# Patient Record
Sex: Male | Born: 1960 | Race: White | Hispanic: No | Marital: Single | Smoking: Never smoker
Health system: Southern US, Community
[De-identification: ages and names within clinical notes are randomized; demographics above are authoritative.]

## PROBLEM LIST (undated history)

## (undated) DIAGNOSIS — M5430 Sciatica, unspecified side: Secondary | ICD-10-CM

## (undated) DIAGNOSIS — M419 Scoliosis, unspecified: Secondary | ICD-10-CM

## (undated) DIAGNOSIS — G8929 Other chronic pain: Secondary | ICD-10-CM

## (undated) DIAGNOSIS — I10 Essential (primary) hypertension: Secondary | ICD-10-CM

## (undated) DIAGNOSIS — F419 Anxiety disorder, unspecified: Secondary | ICD-10-CM

## (undated) DIAGNOSIS — M545 Low back pain, unspecified: Secondary | ICD-10-CM

## (undated) DIAGNOSIS — N189 Chronic kidney disease, unspecified: Secondary | ICD-10-CM

## (undated) HISTORY — PX: EYE SURGERY: SHX253

## (undated) HISTORY — PX: ROTATOR CUFF REPAIR: SHX139

## (undated) HISTORY — PX: CLEFT PALATE REPAIR: SUR1165

---

## 2014-03-12 ENCOUNTER — Emergency Department: Payer: Self-pay | Admitting: Emergency Medicine

## 2014-12-28 ENCOUNTER — Encounter (HOSPITAL_COMMUNITY): Payer: Self-pay | Admitting: Emergency Medicine

## 2014-12-28 ENCOUNTER — Emergency Department (HOSPITAL_COMMUNITY)
Admission: EM | Admit: 2014-12-28 | Discharge: 2014-12-28 | Disposition: A | Discharge status: Home / Self Care | Payer: Self-pay | Attending: Emergency Medicine | Admitting: Emergency Medicine

## 2014-12-28 DIAGNOSIS — I10 Essential (primary) hypertension: Secondary | ICD-10-CM | POA: Insufficient documentation

## 2014-12-28 DIAGNOSIS — N289 Disorder of kidney and ureter, unspecified: Secondary | ICD-10-CM | POA: Insufficient documentation

## 2014-12-28 DIAGNOSIS — Z8659 Personal history of other mental and behavioral disorders: Secondary | ICD-10-CM | POA: Insufficient documentation

## 2014-12-28 DIAGNOSIS — M419 Scoliosis, unspecified: Secondary | ICD-10-CM | POA: Insufficient documentation

## 2014-12-28 DIAGNOSIS — G8929 Other chronic pain: Secondary | ICD-10-CM | POA: Insufficient documentation

## 2014-12-28 DIAGNOSIS — Z87828 Personal history of other (healed) physical injury and trauma: Secondary | ICD-10-CM | POA: Insufficient documentation

## 2014-12-28 DIAGNOSIS — M549 Dorsalgia, unspecified: Secondary | ICD-10-CM | POA: Insufficient documentation

## 2014-12-28 HISTORY — DX: Low back pain: M54.5

## 2014-12-28 HISTORY — DX: Low back pain, unspecified: M54.50

## 2014-12-28 HISTORY — DX: Essential (primary) hypertension: I10

## 2014-12-28 HISTORY — DX: Sciatica, unspecified side: M54.30

## 2014-12-28 HISTORY — DX: Anxiety disorder, unspecified: F41.9

## 2014-12-28 HISTORY — DX: Scoliosis, unspecified: M41.9

## 2014-12-28 HISTORY — DX: Other chronic pain: G89.29

## 2014-12-28 LAB — I-STAT CHEM 8, ED
BUN: 46 mg/dL — ABNORMAL HIGH (ref 6–23)
Calcium, Ion: 1.16 mmol/L (ref 1.12–1.23)
Chloride: 109 mmol/L (ref 96–112)
Creatinine, Ser: 3.4 mg/dL — ABNORMAL HIGH (ref 0.50–1.35)
GLUCOSE: 100 mg/dL — AB (ref 70–99)
HCT: 42 % (ref 39.0–52.0)
HEMOGLOBIN: 14.3 g/dL (ref 13.0–17.0)
Potassium: 4.5 mmol/L (ref 3.5–5.1)
Sodium: 141 mmol/L (ref 135–145)
TCO2: 18 mmol/L (ref 0–100)

## 2014-12-28 MED ORDER — CARVEDILOL 12.5 MG PO TABS
12.5000 mg | ORAL_TABLET | Freq: Two times a day (BID) | ORAL | Status: DC
Start: 2014-12-28 — End: 2015-10-07

## 2014-12-28 MED ORDER — METHOCARBAMOL 500 MG PO TABS: 500.0000 mg | ORAL_TABLET | Freq: Four times a day (QID) | ORAL | Status: DC | PRN

## 2014-12-28 MED ORDER — LISINOPRIL 10 MG PO TABS: 10.0000 mg | ORAL_TABLET | Freq: Once | ORAL | Status: DC

## 2014-12-28 MED ORDER — IBUPROFEN 200 MG PO TABS
600.0000 mg | ORAL_TABLET | Freq: Once | ORAL | Status: AC
  Administered 2014-12-28: 600 mg via ORAL
  Filled 2014-12-28: qty 3

## 2014-12-28 MED ORDER — CARVEDILOL 12.5 MG PO TABS
12.5000 mg | ORAL_TABLET | Freq: Two times a day (BID) | ORAL | Status: DC
  Administered 2014-12-28: 12.5 mg via ORAL
  Filled 2014-12-28 (×2): qty 1

## 2014-12-28 MED ORDER — LISINOPRIL 20 MG PO TABS
20.0000 mg | ORAL_TABLET | Freq: Once | ORAL | Status: AC
  Administered 2014-12-28: 20 mg via ORAL
  Filled 2014-12-28: qty 1

## 2014-12-28 NOTE — ED Notes (Signed)
Per EMS: Pt from [facility].  Pt seen there for chronic low back pain that he has from a work injury.  Pt has hx of htn and has been noncompliant with meds for 2 years.  Pt is asymptomatic.  No CP.

## 2014-12-28 NOTE — Discharge Instructions (Signed)
Read the information below.  Use the prescribed medication as directed.  Please discuss all new medications with your pharmacist.  You may return to the Emergency Department at any time for worsening condition or any new symptoms that concern you.    Hypertension Hypertension is another name for high blood pressure. High blood pressure forces your heart to work harder to pump blood. A blood pressure reading has two numbers, which includes a higher number over a lower number (example: 110/72). HOME CARE   Have your blood pressure rechecked by your doctor.  Only take medicine as told by your doctor. Follow the directions carefully. The medicine does not work as well if you skip doses. Skipping doses also puts you at risk for problems.  Do not smoke.  Monitor your blood pressure at home as told by your doctor. GET HELP IF:  You think you are having a reaction to the medicine you are taking.  You have repeat headaches or feel dizzy.  You have puffiness (swelling) in your ankles.  You have trouble with your vision. GET HELP RIGHT AWAY IF:   You get a very bad headache and are confused.  You feel weak, numb, or faint.  You get chest or belly (abdominal) pain.  You throw up (vomit).  You cannot breathe very well. MAKE SURE YOU:   Understand these instructions.  Will watch your condition.  Will get help right away if you are not doing well or get worse. Document Released: 02/05/2008 Document Revised: 08/24/2013 Document Reviewed: 06/11/2013 [facility] Patient Information 2015 [place], [place]. This information is not intended to replace advice given to you by your health care provider. Make sure you discuss any questions you have with your health care provider.

## 2014-12-28 NOTE — Progress Notes (Signed)
54 yr old male self pay from [facility], went to his orthopedic provider in [place] today seen there for chronic low back pain that he has from a work injury. Pt has hx of htn and has been noncompliant with meds for 2 years Elevated BP.  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  Pt tearful during interaction Empathy and sympathy offered by CMs x 2.   Consult for pcp assistance  Pt provided with [place] Location: [place], [place] [address] Contact Phone: [phone] Services:  [facility] Location: [place], [place] [address] Contact Phone: [phone] Services: Medical Services Remarks: Hours Of Operation: Tuesday: 4:15 PM to 8:00 PM, Thursday: 4:15 PM to 8:00 PM; [place] Location: [place], [place] [address] Contact Phone: [facility] Location: [place], [place] [address] Contact Phone: [phone] Services: Child, Teen, Adult and Printmaker, [place], Wellness, Prenatal Care and [employer], Physical Exams (school, employment, sports, nursing home, etc.), Chronic Illness, Minor Trauma, Immunizations, Flu Shots

## 2014-12-28 NOTE — ED Provider Notes (Signed)
CSN: [id number]     Arrival date & time 12/28/14  1429 History   First MD Initiated Contact with Patient 12/28/14 1439     Chief Complaint  Patient presents with  . Hypertension   Patient is a 54 y.o. male presenting with hypertension. The history is provided by the patient. No language interpreter was used.  Hypertension Pertinent negatives include no chest pain, no abdominal pain, no headaches and no shortness of breath.   This chart was scribed for non-physician practitioner Clayton Bibles, PA-C, working with Noemi Chapel, MD, by Thea Alken, ED Scribe. This patient was seen in room WTR6/WTR6 and the patient's care was started at 2:40 PM.  Ronnie Keith is a 54 y.o. male who presents to the Emergency Department complaining of HTN. Pt was sent by orthopedist, Dr.Hoitt, who he was see's for back pain due to past injury and hx of scoliosis. Pt was diagnosed with BP in the past and was prescribed Lisinopril which he hasn't taken in a year. Pt denies having symptoms except for his current back pain. He reports onset of back pain may be due to work which involves lifting heavy objects. Pt states he had an MRI of back at orthopedist today.. Pt denies CP, SOB, HA, blurry vision, bladder and bowel incontinence, abdominal pain, weakness and numbness in lower extremities.   Past Medical History  Diagnosis Date  . Hypertension   . Chronic low back pain   . Scoliosis   . Anxiety   . Sciatica    No past surgical history on file. No family history on file. History  Substance Use Topics  . Smoking status: Not on file  . Smokeless tobacco: Not on file  . Alcohol Use: Not on file    Review of Systems  Eyes: Negative for visual disturbance.  Respiratory: Negative for shortness of breath.   Cardiovascular: Negative for chest pain.  Gastrointestinal: Negative for nausea, vomiting and abdominal pain.  Genitourinary: Negative for enuresis.  Musculoskeletal: Positive for back pain.  Neurological: Negative  for weakness, numbness and headaches.  All other systems reviewed and are negative.  Allergies  Review of patient's allergies indicates no known allergies.  Home Medications   Prior to Admission medications   Not on File   BP 203/112 mmHg  Pulse 96  Temp(Src) 97.8 F (36.6 C) (Oral)  Resp 18  SpO2 100% Physical Exam  Constitutional: He appears well-developed and well-nourished. No distress.  HENT:  Head: Normocephalic and atraumatic.  Neck: Neck supple.  Cardiovascular: Normal rate and regular rhythm.   Pulmonary/Chest: Effort normal and breath sounds normal. No respiratory distress. He has no wheezes. He has no rales.  Abdominal: Soft. He exhibits no distension and no mass. There is no tenderness. There is no rebound and no guarding.  Musculoskeletal: He exhibits no edema.  Neurological: He is alert. He exhibits normal muscle tone.  CN II-XII intact, EOMs intact, no pronator drift, grip strengths equal bilaterally; strength 5/5 in all extremities with exception of RLE that is limited in strength secondary to pain (per pt), sensation intact in all extremities; finger to nose, heel to shin, rapid alternating movements normal; gait is limping.   Skin: He is not diaphoretic.  Nursing note and vitals reviewed.   ED Course  Procedures (including critical care time) DIAGNOSTIC STUDIES: Oxygen Saturation is 100% on RA, normal by my interpretation.    COORDINATION OF CARE: 2:40 PM- Pt advised of plan for treatment and pt agrees.  Labs Review  Labs Reviewed  I-STAT CHEM 8, ED - Abnormal; Notable for the following:    BUN 46 (*)    Creatinine, Ser 3.40 (*)    Glucose, Bld 100 (*)    All other components within normal limits    Imaging Review No results found.   EKG Interpretation None       4:15 PM Discussed pt and lab results with Dr Audie Pinto.   4:21 PM Discussed pt with Dr Posey Pronto, nephrology, who recommends starting carvedilol 12.5mg  BID with close follow up with PCP  and [facility].    MDM   Final diagnoses:  Essential hypertension  Renal insufficiency   Afebrile, nontoxic patient with chronic back pain, seen at orthopedist today and sent to ED for evaluation for hypertension.  Pt is asymptomatic from this standpoint.  He has an irregular gait that he states is chronic from ongoing back pain.  Does have some decreased strength in his right leg that he states is secondary to pain from his back and is unchanged.  No CP, SOB, headache, visual changes.  Chem 8 revealed significant renal impairment that was previously unknown to him.  Pt advised to stop all NSAIDs.  Discussed pt with Dr Audie Pinto and with Dr Posey Pronto (nephrology) - please see note above.  Pt noted his PCP has retired so he was also seen by case management to arrange close PCP follow up.  D/C home with coreg, PCP, nephrology follow up.   Discussed result, findings, treatment, and follow up  with patient.  Pt given return precautions.  Pt verbalizes understanding and agrees with plan.       I personally performed the services described in this documentation, which was scribed in my presence. The recorded information has been reviewed and is accurate.    Clayton Bibles, PA-C 12/28/14 1900  Leonard Schwartz, MD 12/29/14 [id number]

## 2014-12-28 NOTE — Progress Notes (Signed)
CSW was notified by nurse that the pt has transportation issues. She states the pt was sent pt [facility].   CSW met with pt at bedside. He confirms that he does not have transportation. Patient confirms that his vehicle is at [facility].  CSW gave the nurse a taxi voucher for pt.  Willette Brace [phone] ED CSW 12/28/2014 9:43 PM

## 2015-09-19 ENCOUNTER — Encounter: Payer: Self-pay | Admitting: Emergency Medicine

## 2015-09-19 ENCOUNTER — Inpatient Hospital Stay
Admission: EM | Admit: 2015-09-19 | Discharge: 2015-10-07 | DRG: 682 | Disposition: A | Discharge status: Home / Self Care | Payer: Self-pay | Attending: Internal Medicine | Admitting: Internal Medicine

## 2015-09-19 DIAGNOSIS — R4182 Altered mental status, unspecified: Secondary | ICD-10-CM

## 2015-09-19 DIAGNOSIS — I160 Hypertensive urgency: Secondary | ICD-10-CM | POA: Diagnosis present

## 2015-09-19 DIAGNOSIS — Z9114 Patient's other noncompliance with medication regimen: Secondary | ICD-10-CM

## 2015-09-19 DIAGNOSIS — I159 Secondary hypertension, unspecified: Secondary | ICD-10-CM

## 2015-09-19 DIAGNOSIS — Z6834 Body mass index (BMI) 34.0-34.9, adult: Secondary | ICD-10-CM

## 2015-09-19 DIAGNOSIS — F331 Major depressive disorder, recurrent, moderate: Secondary | ICD-10-CM | POA: Diagnosis present

## 2015-09-19 DIAGNOSIS — R109 Unspecified abdominal pain: Secondary | ICD-10-CM

## 2015-09-19 DIAGNOSIS — N186 End stage renal disease: Secondary | ICD-10-CM

## 2015-09-19 DIAGNOSIS — Z452 Encounter for adjustment and management of vascular access device: Secondary | ICD-10-CM

## 2015-09-19 DIAGNOSIS — E875 Hyperkalemia: Secondary | ICD-10-CM | POA: Diagnosis present

## 2015-09-19 DIAGNOSIS — M419 Scoliosis, unspecified: Secondary | ICD-10-CM | POA: Diagnosis present

## 2015-09-19 DIAGNOSIS — G629 Polyneuropathy, unspecified: Secondary | ICD-10-CM | POA: Diagnosis present

## 2015-09-19 DIAGNOSIS — R7989 Other specified abnormal findings of blood chemistry: Secondary | ICD-10-CM

## 2015-09-19 DIAGNOSIS — E669 Obesity, unspecified: Secondary | ICD-10-CM | POA: Diagnosis present

## 2015-09-19 DIAGNOSIS — L089 Local infection of the skin and subcutaneous tissue, unspecified: Secondary | ICD-10-CM

## 2015-09-19 DIAGNOSIS — Z8249 Family history of ischemic heart disease and other diseases of the circulatory system: Secondary | ICD-10-CM

## 2015-09-19 DIAGNOSIS — F332 Major depressive disorder, recurrent severe without psychotic features: Secondary | ICD-10-CM

## 2015-09-19 DIAGNOSIS — R778 Other specified abnormalities of plasma proteins: Secondary | ICD-10-CM

## 2015-09-19 DIAGNOSIS — N179 Acute kidney failure, unspecified: Secondary | ICD-10-CM

## 2015-09-19 DIAGNOSIS — Z9119 Patient's noncompliance with other medical treatment and regimen: Secondary | ICD-10-CM

## 2015-09-19 DIAGNOSIS — N1330 Unspecified hydronephrosis: Secondary | ICD-10-CM

## 2015-09-19 DIAGNOSIS — I808 Phlebitis and thrombophlebitis of other sites: Secondary | ICD-10-CM | POA: Diagnosis not present

## 2015-09-19 DIAGNOSIS — F4323 Adjustment disorder with mixed anxiety and depressed mood: Secondary | ICD-10-CM | POA: Diagnosis present

## 2015-09-19 DIAGNOSIS — R509 Fever, unspecified: Secondary | ICD-10-CM

## 2015-09-19 DIAGNOSIS — A4901 Methicillin susceptible Staphylococcus aureus infection, unspecified site: Secondary | ICD-10-CM | POA: Diagnosis not present

## 2015-09-19 DIAGNOSIS — Z992 Dependence on renal dialysis: Secondary | ICD-10-CM

## 2015-09-19 DIAGNOSIS — M79675 Pain in left toe(s): Secondary | ICD-10-CM | POA: Diagnosis present

## 2015-09-19 DIAGNOSIS — I120 Hypertensive chronic kidney disease with stage 5 chronic kidney disease or end stage renal disease: Principal | ICD-10-CM | POA: Diagnosis present

## 2015-09-19 DIAGNOSIS — N2581 Secondary hyperparathyroidism of renal origin: Secondary | ICD-10-CM | POA: Diagnosis present

## 2015-09-19 DIAGNOSIS — K5900 Constipation, unspecified: Secondary | ICD-10-CM | POA: Diagnosis present

## 2015-09-19 DIAGNOSIS — D631 Anemia in chronic kidney disease: Secondary | ICD-10-CM | POA: Diagnosis present

## 2015-09-19 LAB — CBC WITH DIFFERENTIAL/PLATELET
BASOS ABS: 0 10*3/uL (ref 0–0.1)
BASOS PCT: 1 %
Eosinophils Absolute: 0.1 10*3/uL (ref 0–0.7)
Eosinophils Relative: 1 %
HCT: 37.4 % — ABNORMAL LOW (ref 40.0–52.0)
HEMOGLOBIN: 12.4 g/dL — AB (ref 13.0–18.0)
LYMPHS PCT: 19 %
Lymphs Abs: 1.3 10*3/uL (ref 1.0–3.6)
MCH: 29.9 pg (ref 26.0–34.0)
MCHC: 33.1 g/dL (ref 32.0–36.0)
MCV: 90.3 fL (ref 80.0–100.0)
Monocytes Absolute: 0.4 10*3/uL (ref 0.2–1.0)
Monocytes Relative: 6 %
NEUTROS ABS: 5.1 10*3/uL (ref 1.4–6.5)
NEUTROS PCT: 73 %
Platelets: 144 10*3/uL — ABNORMAL LOW (ref 150–440)
RBC: 4.14 MIL/uL — ABNORMAL LOW (ref 4.40–5.90)
RDW: 13.7 % (ref 11.5–14.5)
WBC: 6.9 10*3/uL (ref 3.8–10.6)

## 2015-09-19 LAB — BASIC METABOLIC PANEL
ANION GAP: 8 (ref 5–15)
BUN: 63 mg/dL — ABNORMAL HIGH (ref 6–20)
CHLORIDE: 112 mmol/L — AB (ref 101–111)
CO2: 21 mmol/L — AB (ref 22–32)
Calcium: 7.7 mg/dL — ABNORMAL LOW (ref 8.9–10.3)
Creatinine, Ser: 5.21 mg/dL — ABNORMAL HIGH (ref 0.61–1.24)
GFR calc non Af Amer: 12 mL/min — ABNORMAL LOW (ref >60)
GFR, EST AFRICAN AMERICAN: 13 mL/min — AB (ref >60)
GLUCOSE: 82 mg/dL (ref 65–99)
POTASSIUM: 5.4 mmol/L — AB (ref 3.5–5.1)
Sodium: 141 mmol/L (ref 135–145)

## 2015-09-19 LAB — TROPONIN I: Troponin I: 0.04 ng/mL — ABNORMAL HIGH (ref <0.031)

## 2015-09-19 MED ORDER — LISINOPRIL 10 MG PO TABS
10.0000 mg | ORAL_TABLET | Freq: Once | ORAL | Status: AC
  Administered 2015-09-19: 10 mg via ORAL
  Filled 2015-09-19: qty 1

## 2015-09-19 MED ORDER — CLONIDINE HCL 0.1 MG PO TABS
0.1000 mg | ORAL_TABLET | Freq: Once | ORAL | Status: AC
  Administered 2015-09-19: 0.1 mg via ORAL
  Filled 2015-09-19: qty 1

## 2015-09-19 NOTE — ED Notes (Signed)
Pt a/o, vss except bp elevated. No c/o pain. Ambulatory. tearful affect. md aware

## 2015-09-19 NOTE — ED Notes (Signed)
Pt with hx of HTN has not taken bp meds x 1 month due to being unable to afford them.

## 2015-09-19 NOTE — ED Provider Notes (Signed)
[facility] Emergency Department Provider Note    ____________________________________________  Time seen: 1925  I have reviewed the triage vital signs and the nursing notes.   HISTORY  Chief Complaint Hypertension   History limited by: Not Limited   HPI Ronnie Keith is a 55 y.o. male with history of hypertension who presents to the emergency department today because of concerns for high blood pressure. He states he is not had his medication for roughly 1 month secondary to financial constraints. He states he lost his job which has been the primary issue. He denies any chest pain during this time. He states that occasionally he'll get a small headache but he has not had any severe headaches and currently does not have a headache. He denies any recent fevers.     Past Medical History  Diagnosis Date  . Hypertension   . Chronic low back pain   . Scoliosis   . Anxiety   . Sciatica     There are no active problems to display for this patient.   History reviewed. No pertinent past surgical history.  Current Outpatient Rx  Name  Route  Sig  Dispense  Refill  . Ascorbic Acid (VITAMIN C PO)   Oral   Take 1 tablet by mouth 2 (two) times a week.         . carvedilol (COREG) 12.5 MG tablet   Oral   Take 1 tablet (12.5 mg total) by mouth 2 (two) times daily with a meal.   60 tablet   0   . methocarbamol (ROBAXIN) 500 MG tablet   Oral   Take 1 tablet (500 mg total) by mouth every 6 (six) hours as needed for muscle spasms (and back pain).   20 tablet   0     Allergies Review of patient's allergies indicates no known allergies.  History reviewed. No pertinent family history.  Social History Social History  Substance Use Topics  . Smoking status: None  . Smokeless tobacco: None  . Alcohol Use: None    Review of Systems  Constitutional: Negative for fever. Cardiovascular: Negative for chest pain. Respiratory: Negative for shortness  of breath. Gastrointestinal: Negative for abdominal pain, vomiting and diarrhea. Neurological: Negative for headaches, focal weakness or numbness.  10-point ROS otherwise negative.  ____________________________________________   PHYSICAL EXAM:  VITAL SIGNS: ED Triage Vitals  Enc Vitals Group     BP 09/19/15 1740 181/126 mmHg     Pulse Rate 09/19/15 1740 104     Resp --      Temp 09/19/15 1740 98.6 F (37 C)     Temp Source 09/19/15 1740 Oral     SpO2 09/19/15 1740 97 %     Weight 09/19/15 1740 250 lb (113.399 kg)     Height 09/19/15 1740 6' (1.829 m)   Constitutional: Alert and oriented. Well appearing and in no distress. Eyes: Conjunctivae are normal. PERRL. Normal extraocular movements. ENT   Head: Normocephalic and atraumatic.   Nose: No congestion/rhinnorhea.   Mouth/Throat: Mucous membranes are moist.   Neck: No stridor. Hematological/Lymphatic/Immunilogical: No cervical lymphadenopathy. Cardiovascular: Normal rate, regular rhythm.  No murmurs, rubs, or gallops. Respiratory: Normal respiratory effort without tachypnea nor retractions. Breath sounds are clear and equal bilaterally. No wheezes/rales/rhonchi. Gastrointestinal: Soft and nontender. No distention.  Genitourinary: Deferred Musculoskeletal: Normal range of motion in all extremities. No joint effusions.  No lower extremity tenderness nor edema. Neurologic:  Normal speech and language. No gross focal neurologic  deficits are appreciated.  Skin:  Skin is warm, dry and intact. No rash noted. Psychiatric: Mood and affect are normal. Speech and behavior are normal. Patient exhibits appropriate insight and judgment.  ____________________________________________    LABS (pertinent positives/negatives)  Labs Reviewed  CBC WITH DIFFERENTIAL/PLATELET - Abnormal; Notable for the following:    RBC 4.14 (*)    Hemoglobin 12.4 (*)    HCT 37.4 (*)    Platelets 144 (*)    All other components within normal  limits  BASIC METABOLIC PANEL - Abnormal; Notable for the following:    Potassium 5.4 (*)    Chloride 112 (*)    CO2 21 (*)    BUN 63 (*)    Creatinine, Ser 5.21 (*)    Calcium 7.7 (*)    GFR calc non Af Amer 12 (*)    GFR calc Af Amer 13 (*)    All other components within normal limits  TROPONIN I - Abnormal; Notable for the following:    Troponin I 0.04 (*)    All other components within normal limits  TROPONIN I - Abnormal; Notable for the following:    Troponin I 0.06 (*)    All other components within normal limits  CBC  CREATININE, SERUM  CBC  BASIC METABOLIC PANEL     ____________________________________________   EKG  I, Nance Pear, attending physician, personally viewed and interpreted this EKG  EKG Time: 1853 Rate: 98 Rhythm: normal sinus rhythm Axis: left axis deviation Intervals: qtc 510 QRS: delta waves present ST changes: no st elevation Impression: abnormal ekg, WPW  ____________________________________________    RADIOLOGY  None  ____________________________________________   PROCEDURES  Procedure(s) performed: None  Critical Care performed: No  ____________________________________________   INITIAL IMPRESSION / ASSESSMENT AND PLAN / ED COURSE  Pertinent labs & imaging results that were available during my care of the patient were reviewed by me and considered in my medical decision making (see chart for details).  Patient presented to the emergency department today because of concern for high blood pressure. Patient's blood pressure was elevated during emergency department. Initial troponin was mildly elevated at 0.04. I did repeat and it did elevate to 0.06. This point will plan on admission to hospital service.  ____________________________________________   FINAL CLINICAL IMPRESSION(S) / ED DIAGNOSES  Final diagnoses:  Secondary hypertension, unspecified  Elevated troponin     Nance Pear, MD 09/20/15 0120

## 2015-09-19 NOTE — ED Notes (Signed)
(+)   Troponin result called to this RN; result 0.04. Result communicated to attending MD and primary nurse.

## 2015-09-19 NOTE — ED Notes (Signed)
Pharmacy called and notified of need for lisinopril.

## 2015-09-19 NOTE — ED Notes (Signed)
Lab called and notified of add on labs

## 2015-09-20 ENCOUNTER — Encounter: Payer: Self-pay | Admitting: Internal Medicine

## 2015-09-20 DIAGNOSIS — E875 Hyperkalemia: Secondary | ICD-10-CM | POA: Diagnosis present

## 2015-09-20 LAB — URINALYSIS COMPLETE WITH MICROSCOPIC (ARMC ONLY)
BACTERIA UA: NONE SEEN
BILIRUBIN URINE: NEGATIVE
GLUCOSE, UA: 50 mg/dL — AB
Hgb urine dipstick: NEGATIVE
Ketones, ur: NEGATIVE mg/dL
Leukocytes, UA: NEGATIVE
NITRITE: NEGATIVE
Protein, ur: >500 mg/dL — AB
SQUAMOUS EPITHELIAL / LPF: NONE SEEN
Specific Gravity, Urine: 1.009 (ref 1.005–1.030)
pH: 6 (ref 5.0–8.0)

## 2015-09-20 LAB — BASIC METABOLIC PANEL
Anion gap: 3 — ABNORMAL LOW (ref 5–15)
BUN: 60 mg/dL — AB (ref 6–20)
CHLORIDE: 115 mmol/L — AB (ref 101–111)
CO2: 23 mmol/L (ref 22–32)
CREATININE: 5.04 mg/dL — AB (ref 0.61–1.24)
Calcium: 7.4 mg/dL — ABNORMAL LOW (ref 8.9–10.3)
GFR calc Af Amer: 14 mL/min — ABNORMAL LOW (ref >60)
GFR calc non Af Amer: 12 mL/min — ABNORMAL LOW (ref >60)
Glucose, Bld: 94 mg/dL (ref 65–99)
Potassium: 5.1 mmol/L (ref 3.5–5.1)
Sodium: 141 mmol/L (ref 135–145)

## 2015-09-20 LAB — CREATININE, URINE, RANDOM: CREATININE, URINE: 60 mg/dL

## 2015-09-20 LAB — CBC
HCT: 32.4 % — ABNORMAL LOW (ref 40.0–52.0)
HEMOGLOBIN: 10.9 g/dL — AB (ref 13.0–18.0)
MCH: 30.3 pg (ref 26.0–34.0)
MCHC: 33.7 g/dL (ref 32.0–36.0)
MCV: 90 fL (ref 80.0–100.0)
Platelets: 123 10*3/uL — ABNORMAL LOW (ref 150–440)
RBC: 3.6 MIL/uL — ABNORMAL LOW (ref 4.40–5.90)
RDW: 13.3 % (ref 11.5–14.5)
WBC: 6 10*3/uL (ref 3.8–10.6)

## 2015-09-20 LAB — TROPONIN I
Troponin I: 0.04 ng/mL — ABNORMAL HIGH (ref <0.031)
Troponin I: 0.04 ng/mL — ABNORMAL HIGH (ref <0.031)
Troponin I: 0.05 ng/mL — ABNORMAL HIGH (ref <0.031)
Troponin I: 0.06 ng/mL — ABNORMAL HIGH (ref <0.031)

## 2015-09-20 LAB — SODIUM, URINE, RANDOM: Sodium, Ur: 50 mmol/L

## 2015-09-20 LAB — PROTEIN / CREATININE RATIO, URINE
CREATININE, URINE: 67 mg/dL
PROTEIN CREATININE RATIO: 4.39 mg/mg{creat} — AB (ref 0.00–0.15)
Total Protein, Urine: 294 mg/dL

## 2015-09-20 MED ORDER — HYDRALAZINE HCL 50 MG PO TABS
50.0000 mg | ORAL_TABLET | Freq: Three times a day (TID) | ORAL | Status: DC
  Administered 2015-09-20 (×2): 50 mg via ORAL
  Filled 2015-09-20 (×4): qty 1

## 2015-09-20 MED ORDER — MORPHINE SULFATE (PF) 2 MG/ML IV SOLN
2.0000 mg | INTRAVENOUS | Status: DC | PRN
  Administered 2015-09-23 – 2015-10-06 (×11): 2 mg via INTRAVENOUS
  Filled 2015-09-20 (×13): qty 1

## 2015-09-20 MED ORDER — ONDANSETRON HCL 4 MG PO TABS
4.0000 mg | ORAL_TABLET | Freq: Four times a day (QID) | ORAL | Status: DC | PRN
  Administered 2015-09-24 – 2015-09-25 (×3): 4 mg via ORAL
  Filled 2015-09-20 (×5): qty 1

## 2015-09-20 MED ORDER — ASPIRIN EC 81 MG PO TBEC
81.0000 mg | DELAYED_RELEASE_TABLET | Freq: Every day | ORAL | Status: DC
  Administered 2015-09-20 – 2015-10-07 (×14): 81 mg via ORAL
  Filled 2015-09-20 (×14): qty 1

## 2015-09-20 MED ORDER — CALCIUM ACETATE (PHOS BINDER) 667 MG PO CAPS: 2001.0000 mg | ORAL_CAPSULE | Freq: Three times a day (TID) | ORAL | Status: DC

## 2015-09-20 MED ORDER — HYDRALAZINE HCL 20 MG/ML IJ SOLN
10.0000 mg | INTRAMUSCULAR | Status: DC | PRN
  Filled 2015-09-20: qty 1

## 2015-09-20 MED ORDER — SODIUM CHLORIDE 0.9 % IV SOLN
INTRAVENOUS | Status: DC
  Administered 2015-09-20 (×2): via INTRAVENOUS

## 2015-09-20 MED ORDER — LABETALOL HCL 5 MG/ML IV SOLN
10.0000 mg | Freq: Once | INTRAVENOUS | Status: AC
  Administered 2015-09-20: 10 mg via INTRAVENOUS
  Filled 2015-09-20: qty 4

## 2015-09-20 MED ORDER — ACETAMINOPHEN 325 MG PO TABS
650.0000 mg | ORAL_TABLET | Freq: Four times a day (QID) | ORAL | Status: DC | PRN
  Administered 2015-09-21 – 2015-10-04 (×7): 650 mg via ORAL
  Filled 2015-09-20 (×9): qty 2

## 2015-09-20 MED ORDER — OXYCODONE HCL 5 MG PO TABS
5.0000 mg | ORAL_TABLET | ORAL | Status: DC | PRN
  Administered 2015-09-21 – 2015-10-06 (×21): 5 mg via ORAL
  Filled 2015-09-20 (×22): qty 1

## 2015-09-20 MED ORDER — ACETAMINOPHEN 650 MG RE SUPP: 650.0000 mg | Freq: Four times a day (QID) | RECTAL | Status: DC | PRN

## 2015-09-20 MED ORDER — ONDANSETRON HCL 4 MG/2ML IJ SOLN
4.0000 mg | Freq: Four times a day (QID) | INTRAMUSCULAR | Status: DC | PRN
  Administered 2015-09-21 – 2015-10-05 (×11): 4 mg via INTRAVENOUS
  Filled 2015-09-20 (×13): qty 2

## 2015-09-20 MED ORDER — CARVEDILOL 12.5 MG PO TABS
12.5000 mg | ORAL_TABLET | Freq: Two times a day (BID) | ORAL | Status: DC
Start: 2015-09-20 — End: 2015-10-07
  Administered 2015-09-20 – 2015-10-07 (×22): 12.5 mg via ORAL
  Filled 2015-09-20 (×24): qty 1

## 2015-09-20 MED ORDER — SODIUM CHLORIDE 0.9 % IJ SOLN: 3.0000 mL | Freq: Two times a day (BID) | INTRAMUSCULAR | Status: DC

## 2015-09-20 MED ORDER — HEPARIN SODIUM (PORCINE) 5000 UNIT/ML IJ SOLN
5000.0000 [IU] | Freq: Three times a day (TID) | INTRAMUSCULAR | Status: DC
  Administered 2015-09-20 – 2015-09-24 (×11): 5000 [IU] via SUBCUTANEOUS
  Filled 2015-09-20 (×13): qty 1

## 2015-09-20 MED ORDER — SODIUM CHLORIDE 0.9 % IJ SOLN
3.0000 mL | INTRAMUSCULAR | Status: DC | PRN
  Administered 2015-10-04: 3 mL via INTRAVENOUS
  Filled 2015-09-20: qty 10

## 2015-09-20 NOTE — Progress Notes (Signed)
[place] at [facility]                                                                                                                                                                                            Patient Demographics   Ronnie Keith, is a 55 y.o. male, DOB - 1960/12/22, [id number]  Admit date - 09/19/2015   Admitting Physician Lytle Butte, MD  Outpatient Primary MD for the patient is No primary care provider on file.   LOS - 0  Subjective: Patient feels weak but no other complaints, according to his mother he has been noncompliant with his medications for the past few months. Has not seen a doctor in multiple years. Last available creatinine was in April 2016.     Review of Systems:   CONSTITUTIONAL: No documented fever. No fatigue, positive weakness. No weight gain, no weight loss.  EYES: No blurry or double vision.  ENT: No tinnitus. No postnasal drip. No redness of the oropharynx.  RESPIRATORY: No cough, no wheeze, no hemoptysis. No dyspnea.  CARDIOVASCULAR: No chest pain. No orthopnea. No palpitations. No syncope.  GASTROINTESTINAL: No nausea, no vomiting or diarrhea. No abdominal pain. No melena or hematochezia.  GENITOURINARY: No dysuria or hematuria.  ENDOCRINE: No polyuria or nocturia. No heat or cold intolerance.  HEMATOLOGY: No anemia. No bruising. No bleeding.  INTEGUMENTARY: No rashes. No lesions.  MUSCULOSKELETAL: No arthritis. No swelling. No gout.  NEUROLOGIC: No numbness, tingling, or ataxia. No seizure-type activity.  PSYCHIATRIC: No anxiety. No insomnia. No ADD.    Vitals:   Filed Vitals:   09/20/15 0230 09/20/15 0314 09/20/15 0328 09/20/15 1150  BP: 117/73 175/93 160/85 134/75  Pulse: 86 81 81 73  Temp:  97.3 F (36.3 C)  97.6 F (36.4 C)  TempSrc:  Oral  Oral  Resp: 13 16  20   Height:      Weight:  115.667 kg (255 lb)    SpO2: 96% 99% 99% 97%    Wt Readings from Last 3 Encounters:  09/20/15  115.667 kg (255 lb)     Intake/Output Summary (Last 24 hours) at 09/20/15 1447 Last data filed at 09/20/15 0830  Gross per 24 hour  Intake    240 ml  Output    650 ml  Net   -410 ml    Physical Exam:   GENERAL: Pleasant-appearing in no apparent distress.  HEAD, EYES, EARS, NOSE AND THROAT: Atraumatic, normocephalic. Extraocular muscles are intact. Pupils equal and reactive to light. Sclerae anicteric. No conjunctival injection. No oro-pharyngeal erythema.  NECK: Supple. There is no jugular venous  distention. No bruits, no lymphadenopathy, no thyromegaly.  HEART: Regular rate and rhythm,. No murmurs, no rubs, no clicks.  LUNGS: Clear to auscultation bilaterally. No rales or rhonchi. No wheezes.  ABDOMEN: Soft, flat, nontender, nondistended. Has good bowel sounds. No hepatosplenomegaly appreciated.  EXTREMITIES: No evidence of any cyanosis, clubbing, or peripheral edema.  +2 pedal and radial pulses bilaterally.  NEUROLOGIC: The patient is alert, awake, and oriented x3 with no focal motor or sensory deficits appreciated bilaterally.  SKIN: Moist and warm with no rashes appreciated.  Psych: Not anxious, depressed LN: No inguinal LN enlargement    Antibiotics   Anti-infectives    None      Medications   Scheduled Meds: . aspirin EC  81 mg Oral Daily  . carvedilol  12.5 mg Oral BID WC  . heparin  5,000 Units Subcutaneous 3 times per day  . hydrALAZINE  50 mg Oral 3 times per day   Continuous Infusions: . sodium chloride 100 mL/hr at 09/20/15 1302   PRN Meds:.acetaminophen **OR** acetaminophen, hydrALAZINE, morphine injection, ondansetron **OR** ondansetron (ZOFRAN) IV, oxyCODONE, sodium chloride   Data Review:   Micro Results No results found for this or any previous visit (from the past 240 hour(s)).  Radiology Reports No results found.   CBC  Recent Labs Lab 09/19/15 1801 09/20/15 0532  WBC 6.9 6.0  HGB 12.4* 10.9*  HCT 37.4* 32.4*  PLT 144* 123*  MCV  90.3 90.0  MCH 29.9 30.3  MCHC 33.1 33.7  RDW 13.7 13.3  LYMPHSABS 1.3  --   MONOABS 0.4  --   EOSABS 0.1  --   BASOSABS 0.0  --     Chemistries   Recent Labs Lab 09/19/15 1801 09/20/15 0532  NA 141 141  K 5.4* 5.1  CL 112* 115*  CO2 21* 23  GLUCOSE 82 94  BUN 63* 60*  CREATININE 5.21* 5.04*  CALCIUM 7.7* 7.4*   ------------------------------------------------------------------------------------------------------------------ estimated creatinine clearance is 22.5 mL/min (by C-G formula based on Cr of 5.04). ------------------------------------------------------------------------------------------------------------------ No results for input(s): HGBA1C in the last 72 hours. ------------------------------------------------------------------------------------------------------------------ No results for input(s): CHOL, HDL, LDLCALC, TRIG, CHOLHDL, LDLDIRECT in the last 72 hours. ------------------------------------------------------------------------------------------------------------------ No results for input(s): TSH, T4TOTAL, T3FREE, THYROIDAB in the last 72 hours.  Invalid input(s): FREET3 ------------------------------------------------------------------------------------------------------------------ No results for input(s): VITAMINB12, FOLATE, FERRITIN, TIBC, IRON, RETICCTPCT in the last 72 hours.  Coagulation profile No results for input(s): INR, PROTIME in the last 168 hours.  No results for input(s): DDIMER in the last 72 hours.  Cardiac Enzymes  Recent Labs Lab 09/20/15 0532 09/20/15 1132 09/20/15 1344  TROPONINI 0.04* 0.05* 0.04*   ------------------------------------------------------------------------------------------------------------------ Invalid input(s): POCBNP    Assessment & Plan   55 year old Caucasian gentleman history of essential hypertension is presenting with elevated blood pressure.  1. Hypertensive urgency: Continue with Coreg,  add when necessary hydralazine, also add by mouth hydralazine. If needed I can also add clonidine later. 2. Acute kidney injury on chronic kidney disease: Continue IV hydration, await nephrology input  3. Hyperkalemia: IV fluid hydration and follow potassium level 4. Venous thromboembolism prophylactic: Heparin subcutaneous     Code Status Orders        Start     Ordered   09/20/15 0112  Full code   Continuous     09/20/15 0112    Code Status History    Date Active Date Inactive Code Status Order ID Comments User Context   This patient has a current code status but no  historical code status.    Advance Directive Documentation        Most Recent Value   Type of Advance Directive  Healthcare Power of Attorney   Pre-existing out of facility DNR order (yellow form or pink MOST form)     "MOST" Form in Place?             Consults  nephrology  DVT Prophylaxis  heparin  Lab Results  Component Value Date   PLT 123* 09/20/2015     Time Spent in minutes   67min  Dustin Flock M.D on 09/20/2015 at 2:47 PM  Between 7am to 6pm - Pager - [phone]  After 6pm go to [email or web address] - password [email or web address] [facility]   Office  [phone]

## 2015-09-20 NOTE — H&P (Signed)
[facility] NAME: Ronnie Keith    MR#:  [id number]  DATE OF BIRTH:  12/13/1960   DATE OF ADMISSION:  09/19/2015  PRIMARY CARE PHYSICIAN: No primary care provider on file.   REQUESTING/REFERRING PHYSICIAN: Archie Balboa  CHIEF COMPLAINT:   Chief Complaint  Patient presents with  . Hypertension    HISTORY OF PRESENT ILLNESS:  Ronnie Keith  is a 55 y.o. male with a known history of essential hypertension who is presenting with elevated blood pressure. He states that he's noticed his blood pressure is been running "high" probably because he has not taken his medication in over a month. He states that he is not having insurance and his doctor retired so he has no doctor thus no medications. He denies any chest pain, shortness of breath, headache, further symptoms.  PAST MEDICAL HISTORY:   Past Medical History  Diagnosis Date  . Hypertension   . Chronic low back pain   . Scoliosis   . Anxiety   . Sciatica     PAST SURGICAL HISTORY:  History reviewed. No pertinent past surgical history.  SOCIAL HISTORY:   Social History  Substance Use Topics  . Smoking status: Never Smoker   . Smokeless tobacco: Not on file  . Alcohol Use: No    FAMILY HISTORY:   Family History  Problem Relation Age of Onset  . Hypertension Other     DRUG ALLERGIES:  No Known Allergies  REVIEW OF SYSTEMS:  REVIEW OF SYSTEMS:  CONSTITUTIONAL: Denies fevers, chills, fatigue, weakness.  EYES: Denies blurred vision, double vision, or eye pain.  EARS, NOSE, THROAT: Denies tinnitus, ear pain, hearing loss.  RESPIRATORY: denies cough, shortness of breath, wheezing  CARDIOVASCULAR: Denies chest pain, palpitations, edema.  GASTROINTESTINAL: Denies nausea, vomiting, diarrhea, abdominal pain.  GENITOURINARY: Denies dysuria, hematuria.  ENDOCRINE: Denies nocturia or thyroid problems. HEMATOLOGIC AND LYMPHATIC: Denies easy bruising or bleeding.  SKIN:  Denies rash or lesions.  MUSCULOSKELETAL: Denies pain in neck, back, shoulder, knees, hips, or further arthritic symptoms.  NEUROLOGIC: Denies paralysis, paresthesias.  PSYCHIATRIC: Denies anxiety or depressive symptoms. Otherwise full review of systems performed by me is negative.   MEDICATIONS AT HOME:   Prior to Admission medications   Medication Sig Start Date End Date Taking? Authorizing Provider  acetaminophen (TYLENOL) 325 MG tablet Take 650 mg by mouth every 6 (six) hours as needed for mild pain or moderate pain.   Yes Historical Provider, MD  ibuprofen (ADVIL,MOTRIN) 200 MG tablet Take 200-400 mg by mouth every 6 (six) hours as needed for mild pain or moderate pain.   Yes Historical Provider, MD  carvedilol (COREG) 12.5 MG tablet Take 1 tablet (12.5 mg total) by mouth 2 (two) times daily with a meal. Patient not taking: Reported on 09/20/2015 12/28/14   Clayton Bibles, PA-C  methocarbamol (ROBAXIN) 500 MG tablet Take 1 tablet (500 mg total) by mouth every 6 (six) hours as needed for muscle spasms (and back pain). Patient not taking: Reported on 09/20/2015 12/28/14   Clayton Bibles, PA-C      VITAL SIGNS:  Blood pressure 147/89, pulse 97, temperature 98.6 F (37 C), temperature source Oral, resp. rate 18, height 6' (1.829 m), weight 250 lb (113.399 kg), SpO2 97 %.  PHYSICAL EXAMINATION:  VITAL SIGNS: Filed Vitals:   09/20/15 0101 09/20/15 0115  BP: 178/101 147/89  Pulse: 97   Temp:    Resp: 18 18   GENERAL:54 y.o.male currently  in no acute distress.  HEAD: Normocephalic, atraumatic.  EYES: Pupils equal, round, reactive to light. Extraocular muscles intact. No scleral icterus.  MOUTH: Moist mucosal membrane. Dentition intact. No abscess noted.  EAR, NOSE, THROAT: Clear without exudates. No external lesions.  NECK: Supple. No thyromegaly. No nodules. No JVD.  PULMONARY: Clear to ascultation, without wheeze rails or rhonci. No use of accessory muscles, Good respiratory effort. good  air entry bilaterally CHEST: Nontender to palpation.  CARDIOVASCULAR: S1 and S2. Regular rate and rhythm. No murmurs, rubs, or gallops. No edema. Pedal pulses 2+ bilaterally.  GASTROINTESTINAL: Soft, nontender, nondistended. No masses. Positive bowel sounds. No hepatosplenomegaly.  MUSCULOSKELETAL: No swelling, clubbing, or edema. Range of motion full in all extremities.  NEUROLOGIC: Cranial nerves II through XII are intact. No gross focal neurological deficits. Sensation intact. Reflexes intact.  SKIN: No ulceration, lesions, rashes, or cyanosis. Skin warm and dry. Turgor intact.  PSYCHIATRIC: Mood, affect within normal limits. The patient is awake, alert and oriented x 3. Insight, judgment intact.    LABORATORY PANEL:   CBC  Recent Labs Lab 09/19/15 1801  WBC 6.9  HGB 12.4*  HCT 37.4*  PLT 144*   ------------------------------------------------------------------------------------------------------------------  Chemistries   Recent Labs Lab 09/19/15 1801  NA 141  K 5.4*  CL 112*  CO2 21*  GLUCOSE 82  BUN 63*  CREATININE 5.21*  CALCIUM 7.7*   ------------------------------------------------------------------------------------------------------------------  Cardiac Enzymes  Recent Labs Lab 09/19/15 2345  TROPONINI 0.06*   ------------------------------------------------------------------------------------------------------------------  RADIOLOGY:  No results found.  EKG:   Orders placed or performed during the hospital encounter of 09/19/15  . ED EKG  . ED EKG  . EKG 12-Lead  . EKG 12-Lead    IMPRESSION AND PLAN:   55 year old Caucasian gentleman history of essential hypertension is presenting with elevated blood pressure.   1. Hypertensive urgency: Continue with Coreg, add when necessary hydralazine 2. Acute kidney injury: Check urine sodium/creatinine, IV fluid hydration challenge follow urine output/renal function avoid further nephrotoxic agents  including diuretics,NSAIDs, Ace inhibitors 3. Hyperkalemia: IV fluid hydration and follow potassium level IV. Venous thromboembolism prophylactic: Heparin subcutaneous    All the records are reviewed and case discussed with ED provider. Management plans discussed with the patient, family and they are in agreement.  CODE STATUS: Full  TOTAL TIME TAKING CARE OF THIS PATIENT: 35 minutes.    Hower,  Karenann Cai.D on 09/20/2015 at 1:24 AM  Between 7am to 6pm - Pager - [phone]  After 6pm: House Pager: - [facility]  Office  [phone]  CC: Primary care physician; No primary care provider on file.

## 2015-09-20 NOTE — ED Notes (Signed)
Pt sleeping.  nsr on monitor.  

## 2015-09-20 NOTE — Progress Notes (Signed)
CSW and RN staffed patient and his need during Progression. CSW informed RN that Advanced Directives are handled by [employer]. Requested a [place] consult be placed for patient. Patient has no CSW needs at this time. CSW is signing off. CSW is available if a CSW need were to arise.   Ernest Pine, MSW, [facility] [phone]

## 2015-09-20 NOTE — Consult Note (Signed)
[facility] CONSULT NOTE    Date: 09/20/2015                  Patient Name:  Ronnie Keith  MRN: [id number]  DOB: 17-Apr-1961  Age / Sex: 55 y.o., male         PCP: No primary care provider on file.                 Service Requesting Consult: Dr. Lavetta Nielsen                 Reason for Consult: Acute renal failure/CKD stage IV            History of Present Illness: Patient is a 55 y.o. male with a PMHx of Hypertension, chronic low back pain, scoliosis, anxiety, CKD stage IV Baseline Cr 3.4 who was admitted to [facility] on 09/19/2015 for evaluation of uncontrolled hypertension.  History was offered by the patient, his mother, and his aunt.  The patient apparently has long-standing hypertension however he has not seen a physician in many years.  He is been off of his antihypertensives for an undefined.  However it appears that he's been off of his antihypertensives for years.  He reports that he's had significant anxiety recently.  He reports that he was previously bullied at work.  He subsequently lost his job at [employer].  He lives with his mother.  We are asked to see him for acute renal for it in the setting of known chronic kidney disease stage IV. In April of 2016 creatinine was elevated at 3.4.  Upon presentation this time creatinine was 5.2.  With hydration and creatinine is down to 5.0.  Serum calcium was also low at 7.4.  He also appears to be anemic with a hemoglobin of 10.9  He reports diminished appetite as well as dysgeusia.  In addition he has been taking ibuprofen at home on an as-needed basis.   Medications: Outpatient medications: Prescriptions prior to admission  Medication Sig Dispense Refill Last Dose  . acetaminophen (TYLENOL) 325 MG tablet Take 650 mg by mouth every 6 (six) hours as needed for mild pain or moderate pain.   prn at prn  . ibuprofen (ADVIL,MOTRIN) 200 MG tablet Take 200-400 mg by mouth every 6 (six) hours as needed for mild pain or moderate pain.    prn at prn  . carvedilol (COREG) 12.5 MG tablet Take 1 tablet (12.5 mg total) by mouth 2 (two) times daily with a meal. (Patient not taking: Reported on 09/20/2015) 60 tablet 0 Not Taking at Unknown time  . methocarbamol (ROBAXIN) 500 MG tablet Take 1 tablet (500 mg total) by mouth every 6 (six) hours as needed for muscle spasms (and back pain). (Patient not taking: Reported on 09/20/2015) 20 tablet 0 Not Taking at Unknown time    Current medications: Current Facility-Administered Medications  Medication Dose Route Frequency Provider Last Rate Last Dose  . 0.9 %  sodium chloride infusion   Intravenous Continuous Lytle Butte, MD 100 mL/hr at 09/20/15 1302    . acetaminophen (TYLENOL) tablet 650 mg  650 mg Oral Q6H PRN Lytle Butte, MD       Or  . acetaminophen (TYLENOL) suppository 650 mg  650 mg Rectal Q6H PRN Lytle Butte, MD      . aspirin EC tablet 81 mg  81 mg Oral Daily Lytle Butte, MD   81 mg at 09/20/15 0837  . carvedilol (COREG) tablet  12.5 mg  12.5 mg Oral BID WC Lytle Butte, MD   12.5 mg at 09/20/15 [id number]  . heparin injection 5,000 Units  5,000 Units Subcutaneous 3 times per day Lytle Butte, MD   5,000 Units at 09/20/15 1302  . hydrALAZINE (APRESOLINE) injection 10 mg  10 mg Intravenous Q4H PRN Lytle Butte, MD      . hydrALAZINE (APRESOLINE) tablet 50 mg  50 mg Oral 3 times per day Dustin Flock, MD   50 mg at 09/20/15 1302  . morphine 2 MG/ML injection 2 mg  2 mg Intravenous Q4H PRN Lytle Butte, MD      . ondansetron [facility]) tablet 4 mg  4 mg Oral Q6H PRN Lytle Butte, MD       Or  . ondansetron [facility]) injection 4 mg  4 mg Intravenous Q6H PRN Lytle Butte, MD      . oxyCODONE (Oxy IR/ROXICODONE) immediate release tablet 5 mg  5 mg Oral Q4H PRN Lytle Butte, MD      . sodium chloride 0.9 % injection 3 mL  3 mL Intravenous PRN Lytle Butte, MD          Allergies: No Known Allergies    Past Medical History: Past Medical History  Diagnosis Date  .  Hypertension   . Chronic low back pain   . Scoliosis   . Anxiety   . Sciatica      Past Surgical History: History reviewed. No pertinent past surgical history.   Family History: Family History  Problem Relation Age of Onset  . Hypertension Other      Social History: Social History   Social History  . Marital Status: Single    Spouse Name: N/A  . Number of Children: N/A  . Years of Education: N/A   Occupational History  . Not on file.   Social History Main Topics  . Smoking status: Never Smoker   . Smokeless tobacco: Never Used  . Alcohol Use: No  . Drug Use: No  . Sexual Activity: Not Currently   Other Topics Concern  . Not on file   Social History Narrative     Review of Systems: Review of Systems  Constitutional: Negative for fever, chills, weight loss and malaise/fatigue.  HENT: Positive for ear pain. Negative for ear discharge and nosebleeds.   Eyes: Negative for blurred vision and double vision.  Respiratory: Negative for cough, hemoptysis and sputum production.   Cardiovascular: Negative for chest pain, palpitations and orthopnea.  Gastrointestinal: Positive for nausea. Negative for heartburn, vomiting and abdominal pain.  Genitourinary: Negative for dysuria and urgency.  Musculoskeletal: Positive for myalgias and back pain.  Skin: Negative for itching and rash.  Neurological: Negative for dizziness, focal weakness, loss of consciousness and headaches.  Endo/Heme/Allergies: Negative for polydipsia. Does not bruise/bleed easily.  Psychiatric/Behavioral: Negative for depression. The patient is nervous/anxious.      Vital Signs: Blood pressure 134/75, pulse 73, temperature 97.6 F (36.4 C), temperature source Oral, resp. rate 20, height 6' (1.829 m), weight 115.667 kg (255 lb), SpO2 97 %.  Weight trends: Filed Weights   09/19/15 1740 09/20/15 0314  Weight: 113.399 kg (250 lb) 115.667 kg (255 lb)    Physical Exam: General: NAD, resting in bed   Head: Normocephalic, atraumatic.  Eyes: Anicteric, EOMI  Nose: Mucous membranes moist, not inflammed, nonerythematous.  Throat: Oropharynx nonerythematous, no exudate appreciated.   Neck: Supple, trachea midline.  Lungs:  Normal respiratory effort.  Clear to auscultation BL without crackles or wheezes.  Heart: RRR. S1 and S2 normal without gallop, murmur, or rubs.  Abdomen:  BS normoactive. Soft, Nondistended, non-tender.  No masses or organomegaly.  Extremities: No pretibial edema.  Neurologic: A&O X3, Motor strength is 5/5 in the all 4 extremities  Skin: No visible rashes, scars.    Lab results: Basic Metabolic Panel:  Recent Labs Lab 09/19/15 1801 09/20/15 0532  NA 141 141  K 5.4* 5.1  CL 112* 115*  CO2 21* 23  GLUCOSE 82 94  BUN 63* 60*  CREATININE 5.21* 5.04*  CALCIUM 7.7* 7.4*    Liver Function Tests: No results for input(s): AST, ALT, ALKPHOS, BILITOT, PROT, ALBUMIN in the last 168 hours. No results for input(s): LIPASE, AMYLASE in the last 168 hours. No results for input(s): AMMONIA in the last 168 hours.  CBC:  Recent Labs Lab 09/19/15 1801 09/20/15 0532  WBC 6.9 6.0  NEUTROABS 5.1  --   HGB 12.4* 10.9*  HCT 37.4* 32.4*  MCV 90.3 90.0  PLT 144* 123*    Cardiac Enzymes:  Recent Labs Lab 09/19/15 1801 09/19/15 2345 09/20/15 0532 09/20/15 1132 09/20/15 1344  TROPONINI 0.04* 0.06* 0.04* 0.05* 0.04*    BNP: Invalid input(s): POCBNP  CBG: No results for input(s): GLUCAP in the last 168 hours.  Microbiology: No results found for this or any previous visit.  Coagulation Studies: No results for input(s): LABPROT, INR in the last 72 hours.  Urinalysis: No results for input(s): COLORURINE, LABSPEC, PHURINE, GLUCOSEU, HGBUR, BILIRUBINUR, KETONESUR, PROTEINUR, UROBILINOGEN, NITRITE, LEUKOCYTESUR in the last 72 hours.  Invalid input(s): APPERANCEUR    Imaging:  No results found.   Assessment & Plan: Pt is a 55 y.o. male with a PMHx of  Hypertension, chronic low back pain, scoliosis, anxiety, CKD stage IV Baseline Cr 3.4 who was admitted to [facility] on 09/19/2015 for evaluation of uncontrolled hypertension.    1.  Acute renal failure/chronic kidney disease stage IV.  The patient has known existing chronic kidney disease.  Baseline creatinine 3.4 on 12/28/14. -  He presents now with worsening renal function.  Upon presentation creatinine was 5.21.  He also has hypocalcemia, anemia which suggests that his renal insufficiency has been prolonged.  He may actually have end-stage renal disease from uncontrolled hypertension as he's been off of antihypertensives for a prolonged period of time.  We will proceed with renal ultrasound, SPEP, UPEP, ANA, ANCA antibodies, GBM antibodies, C3, C4, urinalysis, and urine protein to creatinine ratio.  He is manifesting some uremic symptoms.  He may actually be deemed end-stage renal disease and if this is the case he will need renal replacement therapy however we will hold off at the moment.  2.  Anemia chronic kidney disease.  No urgent indication for Epogen as hemoglobin is currently greater than 10.  Continue to monitor CBC.  Check SPEP and UPEP as above.  3.  Secondary hyperparathyroidism.  The patient has hypocalcemia.  We will check intact PTH, phosphorus, and vitamin D 25 level.  4.  Hypertension.  This has been uncontrolled for a significant period of time.  Blood pressure currently 134/75.  Continue hydralazine and Coreg for now.  5.  Thanks for consultation.

## 2015-09-21 ENCOUNTER — Inpatient Hospital Stay: Payer: Self-pay

## 2015-09-21 DIAGNOSIS — Z711 Person with feared health complaint in whom no diagnosis is made: Secondary | ICD-10-CM

## 2015-09-21 DIAGNOSIS — N184 Chronic kidney disease, stage 4 (severe): Secondary | ICD-10-CM

## 2015-09-21 DIAGNOSIS — N1330 Unspecified hydronephrosis: Secondary | ICD-10-CM

## 2015-09-21 LAB — VITAMIN D 25 HYDROXY (VIT D DEFICIENCY, FRACTURES): Vit D, 25-Hydroxy: 16.9 ng/mL — ABNORMAL LOW (ref 30.0–100.0)

## 2015-09-21 LAB — RENAL FUNCTION PANEL
ALBUMIN: 3.2 g/dL — AB (ref 3.5–5.0)
Anion gap: 6 (ref 5–15)
BUN: 68 mg/dL — AB (ref 6–20)
CHLORIDE: 114 mmol/L — AB (ref 101–111)
CO2: 19 mmol/L — ABNORMAL LOW (ref 22–32)
CREATININE: 4.92 mg/dL — AB (ref 0.61–1.24)
Calcium: 7.5 mg/dL — ABNORMAL LOW (ref 8.9–10.3)
GFR calc Af Amer: 14 mL/min — ABNORMAL LOW (ref >60)
GFR, EST NON AFRICAN AMERICAN: 12 mL/min — AB (ref >60)
GLUCOSE: 104 mg/dL — AB (ref 65–99)
PHOSPHORUS: 4.1 mg/dL (ref 2.5–4.6)
Potassium: 6.3 mmol/L — ABNORMAL HIGH (ref 3.5–5.1)
Sodium: 139 mmol/L (ref 135–145)

## 2015-09-21 LAB — PROTEIN ELECTROPHORESIS, SERUM
A/G Ratio: 1.1 (ref 0.7–1.7)
ALBUMIN ELP: 3.2 g/dL (ref 2.9–4.4)
Alpha-1-Globulin: 0.2 g/dL (ref 0.0–0.4)
Alpha-2-Globulin: 0.7 g/dL (ref 0.4–1.0)
BETA GLOBULIN: 0.9 g/dL (ref 0.7–1.3)
GAMMA GLOBULIN: 1.1 g/dL (ref 0.4–1.8)
Globulin, Total: 2.9 g/dL (ref 2.2–3.9)
Total Protein ELP: 6.1 g/dL (ref 6.0–8.5)

## 2015-09-21 LAB — PARATHYROID HORMONE, INTACT (NO CA): PTH: 411 pg/mL — ABNORMAL HIGH (ref 15–65)

## 2015-09-21 LAB — ANA W/REFLEX IF POSITIVE: ANA: NEGATIVE

## 2015-09-21 LAB — MPO/PR-3 (ANCA) ANTIBODIES

## 2015-09-21 LAB — GLOMERULAR BASEMENT MEMBRANE ANTIBODIES: GBM AB: 3 U (ref 0–20)

## 2015-09-21 LAB — C3 COMPLEMENT: C3 COMPLEMENT: 115 mg/dL (ref 82–167)

## 2015-09-21 LAB — C4 COMPLEMENT: COMPLEMENT C4, BODY FLUID: 20 mg/dL (ref 14–44)

## 2015-09-21 MED ORDER — CLONIDINE HCL 0.1 MG PO TABS
0.1000 mg | ORAL_TABLET | Freq: Two times a day (BID) | ORAL | Status: DC
  Administered 2015-09-21 – 2015-10-07 (×26): 0.1 mg via ORAL
  Filled 2015-09-21 (×28): qty 1

## 2015-09-21 MED ORDER — IOHEXOL 240 MG/ML SOLN
50.0000 mL | INTRAMUSCULAR | Status: AC
  Administered 2015-09-21 (×2): 50 mL via ORAL

## 2015-09-21 MED ORDER — SODIUM POLYSTYRENE SULFONATE 15 GM/60ML PO SUSP
30.0000 g | Freq: Once | ORAL | Status: AC
  Administered 2015-09-21: 30 g via ORAL
  Filled 2015-09-21: qty 120

## 2015-09-21 MED ORDER — HYDRALAZINE HCL 50 MG PO TABS
100.0000 mg | ORAL_TABLET | Freq: Three times a day (TID) | ORAL | Status: DC
  Administered 2015-09-21 – 2015-09-22 (×2): 100 mg via ORAL
  Filled 2015-09-21 (×3): qty 2

## 2015-09-21 MED ORDER — SODIUM CHLORIDE 0.9 % IV SOLN
INTRAVENOUS | Status: DC
  Administered 2015-09-21 – 2015-09-23 (×4): via INTRAVENOUS

## 2015-09-21 NOTE — Progress Notes (Addendum)
Pt had change in mental status- became agitated and started yelling out.  Pt demanded to go to the bathroom.  Pt assisted to the bathroom.  Pt became lethargic on the toilet.  It took 3 assist to help him safely back to bed.  Lift attempted but pt resisted- laying back on the wall/leaning to resist the safety belt.  MD, Dr. Estanislado Pandy notified.  MD placed order for ct of head- neuro checks Q4hrs/IVF d/c'd-were causing him to be upset.  Nursing supervisor and staff RN attempted to transport pt down for ct.  Pt begged not to go- he jumped out of bed.  Demanding he was not going to have it done.  MD. Dr Estanislado Pandy notified that pt adamant against scan, MD ordered to try again later. Pt continually trying to get out of bed, throwing leg over side rail.  Pt complaining about leg pain- pt offered tylenol- be refused but continued to complain of cramps.  Pt refused all morning meds and his lab work.  Pt requesting water but proceeded to spit it back out.  RN asked pt why he spit, he said he was sorry and then took a sip of water without trouble.  Pt keeps saying he is tired and just wants to go home.  Pt is fighting sleep.  Supervisor to get [employer] crew for day shift.  Will continue to monitor.  RN remained at bedside the rest of shift. RN attempted to notify his mother, no contact information found in system. Jessee Avers

## 2015-09-21 NOTE — Progress Notes (Signed)
[facility]  ROUNDING NOTE   Subjective:  Patient will somewhat agitated last night. Declined interventions, testing, and assistance. Appears to be a bit more calm this a.m. Mother and onto at the bedside. Sitter also at the bedside. Good urine output noted. however overall EGFR has not changed significantly and is 12. Mild to moderate bilateral hydronephrosis found. Echogenic kidneys noted however.  Objective:  Vital signs in last 24 hours:  Temp:  [97.4 F (36.3 C)-98 F (36.7 C)] 97.5 F (36.4 C) (01/19 1327) Pulse Rate:  [73-92] 73 (01/19 1327) Resp:  [16-20] 20 (01/19 1327) BP: (118-179)/(63-118) 118/63 mmHg (01/19 1327) SpO2:  [96 %-100 %] 96 % (01/19 1327)  Weight change:  Filed Weights   09/19/15 1740 09/20/15 0314  Weight: 113.399 kg (250 lb) 115.667 kg (255 lb)    Intake/Output: I/O last 3 completed shifts: In: 3376.7 [P.O.:720; I.V.:2656.7] Out: 1850 [Urine:1850]   Intake/Output this shift:  Total I/O In: 0  Out: 603 [Urine:600; Stool:3]  Physical Exam: General: NAD, resting comfortably.  Head: Normocephalic, atraumatic. Moist oral mucosal membranes  Eyes: Anicteric, PERRL  Neck: Supple, trachea midline  Lungs:  Clear to auscultation normal effort  Heart: Regular rate and rhythm  Abdomen:  Soft, nontender, BS present  Extremities:  no peripheral edema.  Neurologic: Nonfocal, moving all four extremities  Skin: No lesions  Access: none    Basic Metabolic Panel:  Recent Labs Lab 09/19/15 1801 09/20/15 0532 09/21/15 0904  NA 141 141 139  K 5.4* 5.1 6.3*  CL 112* 115* 114*  CO2 21* 23 19*  GLUCOSE 82 94 104*  BUN 63* 60* 68*  CREATININE 5.21* 5.04* 4.92*  CALCIUM 7.7* 7.4* 7.5*  PHOS  --   --  4.1    Liver Function Tests:  Recent Labs Lab 09/21/15 0904  ALBUMIN 3.2*   No results for input(s): LIPASE, AMYLASE in the last 168 hours. No results for input(s): AMMONIA in the last 168 hours.  CBC:  Recent Labs Lab  09/19/15 1801 09/20/15 0532  WBC 6.9 6.0  NEUTROABS 5.1  --   HGB 12.4* 10.9*  HCT 37.4* 32.4*  MCV 90.3 90.0  PLT 144* 123*    Cardiac Enzymes:  Recent Labs Lab 09/19/15 1801 09/19/15 2345 09/20/15 0532 09/20/15 1132 09/20/15 1344  TROPONINI 0.04* 0.06* 0.04* 0.05* 0.04*    BNP: Invalid input(s): POCBNP  CBG: No results for input(s): GLUCAP in the last 168 hours.  Microbiology: No results found for this or any previous visit.  Coagulation Studies: No results for input(s): LABPROT, INR in the last 72 hours.  Urinalysis:  Recent Labs  09/20/15 1537  COLORURINE STRAW*  LABSPEC 1.009  PHURINE 6.0  GLUCOSEU 50*  HGBUR NEGATIVE  BILIRUBINUR NEGATIVE  KETONESUR NEGATIVE  PROTEINUR >500*  NITRITE NEGATIVE  LEUKOCYTESUR NEGATIVE      Imaging: Ct Head Wo Contrast  09/21/2015  CLINICAL DATA:  Change in mental status. EXAM: CT HEAD WITHOUT CONTRAST TECHNIQUE: Contiguous axial images were obtained from the base of the skull through the vertex without intravenous contrast. COMPARISON:  None. FINDINGS: There is no intra or extra-axial fluid collection or mass lesion. The basilar cisterns and ventricles have a normal appearance. There is no CT evidence for acute infarction or hemorrhage. Bone windows are unremarkable. The paranasal and mastoid air cells are normally aerated. IMPRESSION: No evidence for acute intracranial abnormality. Electronically Signed   By: Nolon Nations M.D.   On: 09/21/2015 13:17   US Renal  09/21/2015  CLINICAL DATA:  Acute renal failure. EXAM: RENAL / URINARY TRACT ULTRASOUND COMPLETE COMPARISON:  None. FINDINGS: Right Kidney: Length: 8.3 cm. Right kidney is echogenic consistent chronic medical renal disease. Mild moderate hydronephrosis. 1.0 cm simple cyst upper pole. Left Kidney: Length: 9.7 cm. Left kidney is echogenic consistent chronic medical renal disease. Mild hydronephrosis. 1.7 cm simple cyst lower pole. Bladder: Appears normal for  degree of bladder distention. Prevoid volume 319 cc, postvoid volume 19 cc. Hydronephrosis does not change following voiding. IMPRESSION: Kidneys are echogenic bilaterally consistent chronic medical renal disease. Mild bilateral hydronephrosis. No bladder distention. Electronically Signed   By: Marcello Moores  Register   On: 09/21/2015 12:28   Dg Abd 2 Views  09/21/2015  CLINICAL DATA:  Patient complaining of abdominal pain. History of hypertension and anxiety. No chest pain or sob. EXAM: ABDOMEN - 2 VIEW COMPARISON:  None. FINDINGS: There is no free intraperitoneal air beneath the diaphragm. Supine and erect views of the abdomen demonstrate air-fluid levels in loops of ascending large bowel which is a nonspecific appearance. No evidence for bowel dilatation. Scoliosis. No organomegaly. Prostatic calcifications noted. IMPRESSION: 1. No evidence for bowel obstruction. 2. No free intraperitoneal air. Electronically Signed   By: Nolon Nations M.D.   On: 09/21/2015 14:12     Medications:     . aspirin EC  81 mg Oral Daily  . carvedilol  12.5 mg Oral BID WC  . cloNIDine  0.1 mg Oral BID  . heparin  5,000 Units Subcutaneous 3 times per day  . hydrALAZINE  100 mg Oral 3 times per day   acetaminophen **OR** acetaminophen, hydrALAZINE, morphine injection, ondansetron **OR** ondansetron (ZOFRAN) IV, oxyCODONE, sodium chloride  Assessment/ Plan:  55 y.o. male  with a PMHx of Hypertension, chronic low back pain, scoliosis, anxiety, CKD stage IV Baseline Cr 3.4 who [facility] for evaluation of uncontrolled hypertension.   1. Acute renal failure/chronic kidney disease stage IV/hydronephrosis/proteinuria. The patient has known existing chronic kidney disease. Baseline creatinine 3.4 on 12/28/14.  Suspect hydronephrosis has some chronicity as the kidneys appear echogenic -Patient found to have mild to moderate bilateral hydronephrosis. This may have led to chronic kidney disease and  progression to end-stage renal disease. EGFR not significantly changed at present at 12.  We will order CT scan of the abdomen and pelvis without IV contrast for further evaluation. I have also requested urology consultation for further evaluation. The patient may end up requiring hemodialysis however we will await further evaluation by urology.  We also continue to await serologic workup. If there is nothing to be done from a urologic perspective we will likely proceed with dialysis.  2. Anemia chronic kidney disease. Hemoglobin currently 10.9. Serologic workup pending.  3. Secondary hyperparathyroidism. The patient has hypocalcemia. We will check intact PTH, phosphorus, and vitamin D 25 level.  4. Hypertension.  Continue Coreg, clonidine, and hydralazine   LOS: 1 Ily Denno 1/19/20172:32 PM

## 2015-09-21 NOTE — Progress Notes (Signed)
Rapid response called for mental status change and unsteady gait.  VSS.  Pt alert.  Refusing to have CBG performed and resistant to other realms of care...VS, staying in bed, etc.  Pt stating he doesn't feel well.  Has not slept all night per RN report.  MD notified and STAT CT head w/o contrast ordered.

## 2015-09-21 NOTE — Progress Notes (Signed)
[place] at [facility]                                                                                                                                                                                            Patient Demographics   Ronnie Keith, is a 55 y.o. male, DOB - July 20, 1961, [id number]  Admit date - 09/19/2015   Admitting Physician Lytle Butte, MD  Outpatient Primary MD for the patient is No primary care provider on file.   LOS - 1  Subjective: Patient yesterday was confused and had a CT scan of the head ordered but refused to have that done. Now his mother is here and she is convinced him to get that done. Patient also refused ultrasound of his kidneys now willing to do that.     Review of Systems:   CONSTITUTIONAL: No documented fever. No fatigue, positive weakness. No weight gain, no weight loss.  EYES: No blurry or double vision.  ENT: No tinnitus. No postnasal drip. No redness of the oropharynx.  RESPIRATORY: No cough, no wheeze, no hemoptysis. No dyspnea.  CARDIOVASCULAR: No chest pain. No orthopnea. No palpitations. No syncope.  GASTROINTESTINAL: No nausea, no vomiting or diarrhea. No abdominal pain. No melena or hematochezia.  GENITOURINARY: No dysuria or hematuria.  ENDOCRINE: No polyuria or nocturia. No heat or cold intolerance.  HEMATOLOGY: No anemia. No bruising. No bleeding.  INTEGUMENTARY: No rashes. No lesions.  MUSCULOSKELETAL: No arthritis. No swelling. No gout.  NEUROLOGIC: No numbness, tingling, or ataxia. No seizure-type activity.  PSYCHIATRIC: No anxiety. No insomnia. No ADD.    Vitals:   Filed Vitals:   09/21/15 0558 09/21/15 0901 09/21/15 0924 09/21/15 1327  BP: 162/118 120/71 137/88 118/63  Pulse: 92 78  73  Temp:  97.4 F (36.3 C)  97.5 F (36.4 C)  TempSrc:  Oral  Oral  Resp:  20  20  Height:      Weight:      SpO2: 98% 96%  96%    Wt Readings from Last 3 Encounters:  09/20/15 115.667 kg (255  lb)     Intake/Output Summary (Last 24 hours) at 09/21/15 1435 Last data filed at 09/21/15 1414  Gross per 24 hour  Intake 3016.66 ml  Output   1803 ml  Net 1213.66 ml    Physical Exam:   GENERAL: Pleasant-appearing in no apparent distress.  HEAD, EYES, EARS, NOSE AND THROAT: Atraumatic, normocephalic. Extraocular muscles are intact. Pupils equal and reactive to light. Sclerae anicteric. No conjunctival injection. No oro-pharyngeal erythema.  NECK: Supple. There is no jugular venous distention. No  bruits, no lymphadenopathy, no thyromegaly.  HEART: Regular rate and rhythm,. No murmurs, no rubs, no clicks.  LUNGS: Clear to auscultation bilaterally. No rales or rhonchi. No wheezes.  ABDOMEN: Soft, flat, nontender, nondistended. Has good bowel sounds. No hepatosplenomegaly appreciated.  EXTREMITIES: No evidence of any cyanosis, clubbing, or peripheral edema.  +2 pedal and radial pulses bilaterally.  NEUROLOGIC: The patient is alert, awake, and oriented x3 with no focal motor or sensory deficits appreciated bilaterally.  SKIN: Moist and warm with no rashes appreciated.  Psych: Not anxious, depressed LN: No inguinal LN enlargement    Antibiotics   Anti-infectives    None      Medications   Scheduled Meds: . aspirin EC  81 mg Oral Daily  . carvedilol  12.5 mg Oral BID WC  . cloNIDine  0.1 mg Oral BID  . heparin  5,000 Units Subcutaneous 3 times per day  . hydrALAZINE  100 mg Oral 3 times per day   Continuous Infusions:   PRN Meds:.acetaminophen **OR** acetaminophen, hydrALAZINE, morphine injection, ondansetron **OR** ondansetron (ZOFRAN) IV, oxyCODONE, sodium chloride   Data Review:   Micro Results No results found for this or any previous visit (from the past 240 hour(s)).  Radiology Reports Ct Head Wo Contrast  09/21/2015  CLINICAL DATA:  Change in mental status. EXAM: CT HEAD WITHOUT CONTRAST TECHNIQUE: Contiguous axial images were obtained from the base of the  skull through the vertex without intravenous contrast. COMPARISON:  None. FINDINGS: There is no intra or extra-axial fluid collection or mass lesion. The basilar cisterns and ventricles have a normal appearance. There is no CT evidence for acute infarction or hemorrhage. Bone windows are unremarkable. The paranasal and mastoid air cells are normally aerated. IMPRESSION: No evidence for acute intracranial abnormality. Electronically Signed   By: Nolon Nations M.D.   On: 09/21/2015 13:17   US Renal  09/21/2015  CLINICAL DATA:  Acute renal failure. EXAM: RENAL / URINARY TRACT ULTRASOUND COMPLETE COMPARISON:  None. FINDINGS: Right Kidney: Length: 8.3 cm. Right kidney is echogenic consistent chronic medical renal disease. Mild moderate hydronephrosis. 1.0 cm simple cyst upper pole. Left Kidney: Length: 9.7 cm. Left kidney is echogenic consistent chronic medical renal disease. Mild hydronephrosis. 1.7 cm simple cyst lower pole. Bladder: Appears normal for degree of bladder distention. Prevoid volume 319 cc, postvoid volume 19 cc. Hydronephrosis does not change following voiding. IMPRESSION: Kidneys are echogenic bilaterally consistent chronic medical renal disease. Mild bilateral hydronephrosis. No bladder distention. Electronically Signed   By: Marcello Moores  Register   On: 09/21/2015 12:28   Dg Abd 2 Views  09/21/2015  CLINICAL DATA:  Patient complaining of abdominal pain. History of hypertension and anxiety. No chest pain or sob. EXAM: ABDOMEN - 2 VIEW COMPARISON:  None. FINDINGS: There is no free intraperitoneal air beneath the diaphragm. Supine and erect views of the abdomen demonstrate air-fluid levels in loops of ascending large bowel which is a nonspecific appearance. No evidence for bowel dilatation. Scoliosis. No organomegaly. Prostatic calcifications noted. IMPRESSION: 1. No evidence for bowel obstruction. 2. No free intraperitoneal air. Electronically Signed   By: Nolon Nations M.D.   On: 09/21/2015 14:12      CBC  Recent Labs Lab 09/19/15 1801 09/20/15 0532  WBC 6.9 6.0  HGB 12.4* 10.9*  HCT 37.4* 32.4*  PLT 144* 123*  MCV 90.3 90.0  MCH 29.9 30.3  MCHC 33.1 33.7  RDW 13.7 13.3  LYMPHSABS 1.3  --   MONOABS 0.4  --  EOSABS 0.1  --   BASOSABS 0.0  --     Chemistries   Recent Labs Lab 09/19/15 1801 09/20/15 0532 09/21/15 0904  NA 141 141 139  K 5.4* 5.1 6.3*  CL 112* 115* 114*  CO2 21* 23 19*  GLUCOSE 82 94 104*  BUN 63* 60* 68*  CREATININE 5.21* 5.04* 4.92*  CALCIUM 7.7* 7.4* 7.5*   ------------------------------------------------------------------------------------------------------------------ estimated creatinine clearance is 23.1 mL/min (by C-G formula based on Cr of 4.92). ------------------------------------------------------------------------------------------------------------------ No results for input(s): HGBA1C in the last 72 hours. ------------------------------------------------------------------------------------------------------------------ No results for input(s): CHOL, HDL, LDLCALC, TRIG, CHOLHDL, LDLDIRECT in the last 72 hours. ------------------------------------------------------------------------------------------------------------------ No results for input(s): TSH, T4TOTAL, T3FREE, THYROIDAB in the last 72 hours.  Invalid input(s): FREET3 ------------------------------------------------------------------------------------------------------------------ No results for input(s): VITAMINB12, FOLATE, FERRITIN, TIBC, IRON, RETICCTPCT in the last 72 hours.  Coagulation profile No results for input(s): INR, PROTIME in the last 168 hours.  No results for input(s): DDIMER in the last 72 hours.  Cardiac Enzymes  Recent Labs Lab 09/20/15 0532 09/20/15 1132 09/20/15 1344  TROPONINI 0.04* 0.05* 0.04*   ------------------------------------------------------------------------------------------------------------------ Invalid input(s):  POCBNP    Assessment & Plan   55 year old Caucasian gentleman history of essential hypertension is presenting with elevated blood pressure.  1. Hypertensive urgency:  Continue Coreg and clonidine. As well as hydralazine. 2. Acute kidney injury on chronic kidney disease: Continue IV hydration,  renal function improved nephrology following 3. Hyperkalemia: Kayexalate 1 4. Venous thromboembolism prophylactic: Heparin subcutaneous     Code Status Orders        Start     Ordered   09/20/15 0112  Full code   Continuous     09/20/15 0112    Code Status History    Date Active Date Inactive Code Status Order ID Comments User Context   This patient has a current code status but no historical code status.    Advance Directive Documentation        Most Recent Value   Type of Advance Directive  Healthcare Power of Attorney   Pre-existing out of facility DNR order (yellow form or pink MOST form)     "MOST" Form in Place?             Consults  nephrology  DVT Prophylaxis  heparin  Lab Results  Component Value Date   PLT 123* 09/20/2015     Time Spent in minutes  33min  Dustin Flock M.D on 09/21/2015 at 2:35 PM  Between 7am to 6pm - Pager - [phone]  After 6pm go to [email or web address] - password [email or web address] [facility]   Office  [phone]

## 2015-09-21 NOTE — Consult Note (Signed)
@ENCDATE @ 4:39 PM   Ronnie Keith 01/07/1961 [id number]  Referring provider: Dr. Serita Grit  Chief Complaint  Patient presents with  . Hypertension    HPI: The patient is a 55 year old man with a past medical history significant for CKD stage IV with a creatinine of approximately 3.5 at baseline presented with a hypertensive emergency. Urology was consulted for mild bilateral hydronephrosis seen on ultrasound. He also has significant medical renal disease seen on his ultrasound. Of note, the patient stopped taking his antihypertensive when he became upset that he lost his job. He is a poor historian and unable to provide much history at this time. In review of his chart, his creatinine at admission was 5.2 and is improved to 4.9 at this time. The patient denies any other urological problems. The patient recently underwent a CT scan which shows no evidence of hydronephrosis.    PMH: Past Medical History  Diagnosis Date  . Hypertension   . Chronic low back pain   . Scoliosis   . Anxiety   . Sciatica     Surgical History: History reviewed. No pertinent past surgical history.  Home Medications:    Medication List    ASK your doctor about these medications        acetaminophen 325 MG tablet  Commonly known as:  TYLENOL  Take 650 mg by mouth every 6 (six) hours as needed for mild pain or moderate pain.     carvedilol 12.5 MG tablet  Commonly known as:  COREG  Take 1 tablet (12.5 mg total) by mouth 2 (two) times daily with a meal.     ibuprofen 200 MG tablet  Commonly known as:  ADVIL,MOTRIN  Take 200-400 mg by mouth every 6 (six) hours as needed for mild pain or moderate pain.     methocarbamol 500 MG tablet  Commonly known as:  ROBAXIN  Take 1 tablet (500 mg total) by mouth every 6 (six) hours as needed for muscle spasms (and back pain).        Allergies: No Known Allergies  Family History: Family History  Problem Relation Age of Onset  . Hypertension Other      Social History:  reports that he has never smoked. He has never used smokeless tobacco. He reports that he does not drink alcohol or use illicit drugs.  ROS: 12 point ROS otherwise negative                                        Physical Exam: BP 118/63 mmHg  Pulse 73  Temp(Src) 97.5 F (36.4 C) (Oral)  Resp 20  Ht 6' (1.829 m)  Wt 255 lb (115.667 kg)  BMI 34.58 kg/m2  SpO2 96%  Constitutional:  Alert and oriented, No acute distress. HEENT: [place] AT, moist mucus membranes.  Trachea midline, no masses. Cardiovascular: No clubbing, cyanosis, or edema. Respiratory: Normal respiratory effort, no increased work of breathing. GI: Abdomen is soft, nontender, nondistended, no abdominal masses GU: No CVA tenderness.  normal phallus. Testicles descended equal bilaterally. Skin: No rashes, bruises or suspicious lesions. Lymph: No cervical or inguinal adenopathy. Neurologic: Grossly intact, no focal deficits, moving all 4 extremities. Psychiatric: Normal mood and affect.  Laboratory Data: Lab Results  Component Value Date   WBC 6.0 09/20/2015   HGB 10.9* 09/20/2015   HCT 32.4* 09/20/2015   MCV 90.0 09/20/2015   PLT  123* 09/20/2015    Lab Results  Component Value Date   CREATININE 4.92* 09/21/2015    No results found for: PSA  No results found for: TESTOSTERONE  No results found for: HGBA1C  Urinalysis    Component Value Date/Time   COLORURINE STRAW* 09/20/2015 1537   APPEARANCEUR CLEAR* 09/20/2015 1537   LABSPEC 1.009 09/20/2015 1537   PHURINE 6.0 09/20/2015 1537   GLUCOSEU 50* 09/20/2015 1537   HGBUR NEGATIVE 09/20/2015 1537   BILIRUBINUR NEGATIVE 09/20/2015 1537   KETONESUR NEGATIVE 09/20/2015 1537   PROTEINUR >500* 09/20/2015 1537   NITRITE NEGATIVE 09/20/2015 1537   LEUKOCYTESUR NEGATIVE 09/20/2015 1537    Pertinent Imaging: Review of CT images shows no hydronephrosis.   Assessment & Plan:    1. CKD IV with no evidence of  obstruction on CT scan  No urological intervention indicated this time Medical management per primary team and nephrology  Please call with questions   Nickie Retort, [facility] [address], [place], [place] [address] [phone]

## 2015-09-21 NOTE — Progress Notes (Signed)
Patient has been cooperative throughout the shift. Alert and oriented. Has taken his medications other than hydralazine once and heparin. CT of head, abdominal xray, CT of abdomen/pelvis, and kidney ultrasound performed today. Urology has signed off and nephrology is still following. Continuing IV hydration per Dr. Posey Pronto. Patient was complaining of severe leg cramps this morning. Given oxycodone with improvement, and this evening it has resolved. Potassium was 6.1 this morning. Given kayexalate. Patient has had a few bowel movements since. Blood pressure is stable. Sinus rhythm on tele. Will continue to monitor.

## 2015-09-22 LAB — CBC
HEMATOCRIT: 30.2 % — AB (ref 40.0–52.0)
Hemoglobin: 9.8 g/dL — ABNORMAL LOW (ref 13.0–18.0)
MCH: 28.8 pg (ref 26.0–34.0)
MCHC: 32.4 g/dL (ref 32.0–36.0)
MCV: 89.1 fL (ref 80.0–100.0)
PLATELETS: 106 10*3/uL — AB (ref 150–440)
RBC: 3.39 MIL/uL — ABNORMAL LOW (ref 4.40–5.90)
RDW: 13.5 % (ref 11.5–14.5)
WBC: 5.4 10*3/uL (ref 3.8–10.6)

## 2015-09-22 LAB — RENAL FUNCTION PANEL
Albumin: 3.1 g/dL — ABNORMAL LOW (ref 3.5–5.0)
Anion gap: 7 (ref 5–15)
BUN: 61 mg/dL — AB (ref 6–20)
CHLORIDE: 111 mmol/L (ref 101–111)
CO2: 19 mmol/L — AB (ref 22–32)
Calcium: 7.2 mg/dL — ABNORMAL LOW (ref 8.9–10.3)
Creatinine, Ser: 5.01 mg/dL — ABNORMAL HIGH (ref 0.61–1.24)
GFR calc Af Amer: 14 mL/min — ABNORMAL LOW (ref >60)
GFR, EST NON AFRICAN AMERICAN: 12 mL/min — AB (ref >60)
Glucose, Bld: 88 mg/dL (ref 65–99)
POTASSIUM: 5.3 mmol/L — AB (ref 3.5–5.1)
Phosphorus: 5.3 mg/dL — ABNORMAL HIGH (ref 2.5–4.6)
Sodium: 137 mmol/L (ref 135–145)

## 2015-09-22 LAB — PROTEIN ELECTRO, RANDOM URINE
ALPHA-1-GLOBULIN, U: 6.7 %
ALPHA-2-GLOBULIN, U: 6.8 %
Albumin ELP, Urine: 63.2 %
BETA GLOBULIN, U: 13.4 %
Gamma Globulin, U: 9.9 %
Total Protein, Urine: 307.4 mg/dL

## 2015-09-22 MED ORDER — HYDRALAZINE HCL 25 MG PO TABS
25.0000 mg | ORAL_TABLET | Freq: Three times a day (TID) | ORAL | Status: DC
  Administered 2015-09-23 – 2015-10-07 (×23): 25 mg via ORAL
  Filled 2015-09-22 (×29): qty 1

## 2015-09-22 MED ORDER — TUBERCULIN PPD 5 UNIT/0.1ML ID SOLN
5.0000 [IU] | Freq: Once | INTRADERMAL | Status: AC
  Administered 2015-09-22: 5 [IU] via INTRADERMAL
  Filled 2015-09-22: qty 0.1

## 2015-09-22 NOTE — Care Management Note (Signed)
Case Management Note  Patient Details  Name: KENDALE PLUCINSKI MRN: [id number] Date of Birth: [birth year]  Subjective/Objective:   Mr Rametta is uninsured and reports that his PCP retired. Mr Heinle was sleeping soundly but his Mother was at bedside. Provided Mr Allman Mother with a list of local clinics which serve the underinsured and individuals who have no medical provider. Requested that Mr Whitesell call one of these clinics as soon as he is discharged from the hospital to obtain an appointment to receive medical care. Mr Arcos mother agreed to follow up with reminding Mr Kosmicki to call one of these clinics to obtain medical care and assistance with medications.               Action/Plan:   Expected Discharge Date:                  Expected Discharge Plan:     In-House Referral:     Discharge planning Services     Post Acute Care Choice:    Choice offered to:     DME Arranged:    DME Agency:     HH Arranged:    [place] Agency:     Status of Service:     Medicare Important Message Given:    Date Medicare IM Given:    Medicare IM give by:    Date Additional Medicare IM Given:    Additional Medicare Important Message give by:     If discussed at [place] of Stay Meetings, dates discussed:    Additional Comments:  Friend Dorfman A, RN 09/22/2015, 3:47 PM

## 2015-09-22 NOTE — Progress Notes (Signed)
Patient not sure he wants to start dialysis he does not want catheter placed at this time

## 2015-09-22 NOTE — Progress Notes (Signed)
Sleeping most of shift;pleasant and cooperative on awakening.

## 2015-09-22 NOTE — Progress Notes (Signed)
[name] at [facility]                                                                                                                                                                                            Patient Demographics   Ronnie Keith, is a 55 y.o. male, DOB - [birth year], NZ:[id number]  Admit date - 09/19/2015   Admitting Physician Lytle Butte, MD  Outpatient Primary MD for the patient is No primary care provider on file.   LOS - 2  Subjective:  Currently sleepy denies any complaints   Review of Systems:   CONSTITUTIONAL: No documented fever. No fatigue, positive weakness. No weight gain, no weight loss.  EYES: No blurry or double vision.  ENT: No tinnitus. No postnasal drip. No redness of the oropharynx.  RESPIRATORY: No cough, no wheeze, no hemoptysis. No dyspnea.  CARDIOVASCULAR: No chest pain. No orthopnea. No palpitations. No syncope.  GASTROINTESTINAL: No nausea, no vomiting or diarrhea. No abdominal pain. No melena or hematochezia.  GENITOURINARY: No dysuria or hematuria.  ENDOCRINE: No polyuria or nocturia. No heat or cold intolerance.  HEMATOLOGY: No anemia. No bruising. No bleeding.  INTEGUMENTARY: No rashes. No lesions.  MUSCULOSKELETAL: No arthritis. No swelling. No gout.  NEUROLOGIC: No numbness, tingling, or ataxia. No seizure-type activity.  PSYCHIATRIC: No anxiety. No insomnia. No ADD.    Vitals:   Filed Vitals:   09/22/15 0413 09/22/15 0850 09/22/15 1123 09/22/15 1417  BP: 122/75 120/47 104/50 114/60  Pulse: 91 98 93 93  Temp: 97.6 F (36.4 C)  98.1 F (36.7 C) 98.3 F (36.8 C)  TempSrc: Oral  Oral Oral  Resp: 19     Height:      Weight:      SpO2: 100% 96% 94%     Wt Readings from Last 3 Encounters:  09/20/15 115.667 kg (255 lb)     Intake/Output Summary (Last 24 hours) at 09/22/15 1550 Last data filed at 09/22/15 1130  Gross per 24 hour  Intake 1231.66 ml  Output    750 ml  Net 481.66 ml     Physical Exam:   GENERAL: Pleasant-appearing in no apparent distress.  HEAD, EYES, EARS, NOSE AND THROAT: Atraumatic, normocephalic. Extraocular muscles are intact. Pupils equal and reactive to light. Sclerae anicteric. No conjunctival injection. No oro-pharyngeal erythema.  NECK: Supple. There is no jugular venous distention. No bruits, no lymphadenopathy, no thyromegaly.  HEART: Regular rate and rhythm,. No murmurs, no rubs, no clicks.  LUNGS: Clear to auscultation bilaterally. No rales or rhonchi. No wheezes.  ABDOMEN: Soft, flat, nontender, nondistended. Has good  bowel sounds. No hepatosplenomegaly appreciated.  EXTREMITIES: No evidence of any cyanosis, clubbing, or peripheral edema.  +2 pedal and radial pulses bilaterally.  NEUROLOGIC: The patient is alert, awake, and oriented x3 with no focal motor or sensory deficits appreciated bilaterally.  SKIN: Moist and warm with no rashes appreciated.  Psych: Not anxious, depressed LN: No inguinal LN enlargement    Antibiotics   Anti-infectives    None      Medications   Scheduled Meds: . aspirin EC  81 mg Oral Daily  . carvedilol  12.5 mg Oral BID WC  . cloNIDine  0.1 mg Oral BID  . heparin  5,000 Units Subcutaneous 3 times per day  . hydrALAZINE  100 mg Oral 3 times per day  . tuberculin  5 Units Intradermal Once   Continuous Infusions: . sodium chloride 100 mL/hr at 09/22/15 1430   PRN Meds:.acetaminophen **OR** acetaminophen, hydrALAZINE, morphine injection, ondansetron **OR** ondansetron (ZOFRAN) IV, oxyCODONE, sodium chloride   Data Review:   Micro Results No results found for this or any previous visit (from the past 240 hour(s)).  Radiology Reports Ct Abdomen Pelvis Wo Contrast  09/21/2015  CLINICAL DATA:  Hydronephrosis. Hypertension. Chronic low back pain. Scoliosis. Stage 4 chronic kidney disease. EXAM: CT ABDOMEN AND PELVIS WITHOUT CONTRAST TECHNIQUE: Multidetector CT imaging of the abdomen and pelvis was  performed following the standard protocol without IV contrast. COMPARISON:  09/21/2015 FINDINGS: Lower chest: Mild airway thickening in both lower lobes. Mild cardiomegaly. Hepatobiliary: Unremarkable Pancreas: Unremarkable Spleen: Unremarkable Adrenals/Urinary Tract: Adrenal glands normal. Bilateral renal atrophy. Several small marginal cysts are present in the kidneys. No hydronephrosis or hydroureter. No stones identified. Stomach/Bowel: Orally administered contrast extends through to the colon. Frothy air fluid level in the rectum suggesting diarrheal process. Vascular/Lymphatic: Upper normal sized peripancreatic and porta hepatis lymph nodes. No significant atherosclerotic calcification in the abdomen or pelvis. Reproductive: Scattered prostate gland calcifications primarily in the central zone but potentially also in the left peripheral zone. Other: No supplemental non-categorized findings. Musculoskeletal: Fatty left spermatic cord. IMPRESSION: 1. No adrenal mass. No significant atherosclerosis to suggest renal artery stenosis. 2. Several small suspected cysts of the kidneys. Renal atrophy. No hydronephrosis currently. No hydroureter. 3. Airway thickening is present, suggesting bronchitis or reactive airways disease. 4. Mild cardiomegaly. 5. Scattered prostate gland calcifications. Electronically Signed   By: Van Clines M.D.   On: 09/21/2015 17:09   Ct Head Wo Contrast  09/21/2015  CLINICAL DATA:  Change in mental status. EXAM: CT HEAD WITHOUT CONTRAST TECHNIQUE: Contiguous axial images were obtained from the base of the skull through the vertex without intravenous contrast. COMPARISON:  None. FINDINGS: There is no intra or extra-axial fluid collection or mass lesion. The basilar cisterns and ventricles have a normal appearance. There is no CT evidence for acute infarction or hemorrhage. Bone windows are unremarkable. The paranasal and mastoid air cells are normally aerated. IMPRESSION: No evidence  for acute intracranial abnormality. Electronically Signed   By: Nolon Nations M.D.   On: 09/21/2015 13:17   US Renal  09/21/2015  CLINICAL DATA:  Acute renal failure. EXAM: RENAL / URINARY TRACT ULTRASOUND COMPLETE COMPARISON:  None. FINDINGS: Right Kidney: Length: 8.3 cm. Right kidney is echogenic consistent chronic medical renal disease. Mild moderate hydronephrosis. 1.0 cm simple cyst upper pole. Left Kidney: Length: 9.7 cm. Left kidney is echogenic consistent chronic medical renal disease. Mild hydronephrosis. 1.7 cm simple cyst lower pole. Bladder: Appears normal for degree of bladder distention. Prevoid volume 319  cc, postvoid volume 19 cc. Hydronephrosis does not change following voiding. IMPRESSION: Kidneys are echogenic bilaterally consistent chronic medical renal disease. Mild bilateral hydronephrosis. No bladder distention. Electronically Signed   By: Marcello Moores  Register   On: 09/21/2015 12:28   Dg Abd 2 Views  09/21/2015  CLINICAL DATA:  Patient complaining of abdominal pain. History of hypertension and anxiety. No chest pain or sob. EXAM: ABDOMEN - 2 VIEW COMPARISON:  None. FINDINGS: There is no free intraperitoneal air beneath the diaphragm. Supine and erect views of the abdomen demonstrate air-fluid levels in loops of ascending large bowel which is a nonspecific appearance. No evidence for bowel dilatation. Scoliosis. No organomegaly. Prostatic calcifications noted. IMPRESSION: 1. No evidence for bowel obstruction. 2. No free intraperitoneal air. Electronically Signed   By: Nolon Nations M.D.   On: 09/21/2015 14:12     CBC  Recent Labs Lab 09/19/15 1801 09/20/15 0532 09/22/15 0548  WBC 6.9 6.0 5.4  HGB 12.4* 10.9* 9.8*  HCT 37.4* 32.4* 30.2*  PLT 144* 123* 106*  MCV 90.3 90.0 89.1  MCH 29.9 30.3 28.8  MCHC 33.1 33.7 32.4  RDW 13.7 13.3 13.5  LYMPHSABS 1.3  --   --   MONOABS 0.4  --   --   EOSABS 0.1  --   --   BASOSABS 0.0  --   --     Chemistries   Recent Labs Lab  09/19/15 1801 09/20/15 0532 09/21/15 0904 09/22/15 0548  NA 141 141 139 137  K 5.4* 5.1 6.3* 5.3*  CL 112* 115* 114* 111  CO2 21* 23 19* 19*  GLUCOSE 82 94 104* 88  BUN 63* 60* 68* 61*  CREATININE 5.21* 5.04* 4.92* 5.01*  CALCIUM 7.7* 7.4* 7.5* 7.2*   ------------------------------------------------------------------------------------------------------------------ estimated creatinine clearance is 22.6 mL/min (by C-G formula based on Cr of 5.01). ------------------------------------------------------------------------------------------------------------------ No results for input(s): HGBA1C in the last 72 hours. ------------------------------------------------------------------------------------------------------------------ No results for input(s): CHOL, HDL, LDLCALC, TRIG, CHOLHDL, LDLDIRECT in the last 72 hours. ------------------------------------------------------------------------------------------------------------------ No results for input(s): TSH, T4TOTAL, T3FREE, THYROIDAB in the last 72 hours.  Invalid input(s): FREET3 ------------------------------------------------------------------------------------------------------------------ No results for input(s): VITAMINB12, FOLATE, FERRITIN, TIBC, IRON, RETICCTPCT in the last 72 hours.  Coagulation profile No results for input(s): INR, PROTIME in the last 168 hours.  No results for input(s): DDIMER in the last 72 hours.  Cardiac Enzymes  Recent Labs Lab 09/20/15 0532 09/20/15 1132 09/20/15 1344  TROPONINI 0.04* 0.05* 0.04*   ------------------------------------------------------------------------------------------------------------------ Invalid input(s): POCBNP    Assessment & Plan   55 year old Caucasian gentleman history of essential hypertension is presenting with elevated blood pressure.  1. Hypertensive urgency:  Continue Coreg blood pressures improved, [place] to trend downwards I'll decrease his  hydralazine dose. 2. Acute kidney injury on chronic kidney disease: Continue IV hydration,   Renal function continues to be elevated. Nephrology following, renal ultrasound suggestive of chronic kidney disease  nephrology following 3. Hyperkalemia: Received Kayexalate potassium improved 4. Venous thromboembolism prophylactic: Heparin subcutaneous     Code Status Orders        Start     Ordered   09/20/15 0112  Full code   Continuous     09/20/15 0112    Code Status History    Date Active Date Inactive Code Status Order ID Comments User Context   This patient has a current code status but no historical code status.    Advance Directive Documentation        Most Recent Value   Type  of [place]   Pre-existing out of facility DNR order (yellow form or pink MOST form)     "MOST" Form in Place?             Consults  nephrology  DVT Prophylaxis  heparin  Lab Results  Component Value Date   PLT 106* 09/22/2015     Time Spent in minutes  54min  Imane Burrough, Chana Bode M.D on 09/22/2015 at 3:50 PM  Between 7am to 6pm - Pager - [phone]  After 6pm go to [email or web address] - password [email or web address] [place] [facility]   Office  [phone]

## 2015-09-22 NOTE — Progress Notes (Signed)
[facility]  ROUNDING NOTE   Subjective:  Patient somnolent today but arousable. Doesn't appear to understand that he has ESRD. No hydronephrosis noted on CT, however this was present on renal US. Serologic work up negative, pt does have underlying proteinuria. Renal function remains very low, EGFR remains 12.   Objective:  Vital signs in last 24 hours:  Temp:  [97.6 F (36.4 C)-98.3 F (36.8 C)] 98.3 F (36.8 C) (01/20 1417) Pulse Rate:  [79-98] 93 (01/20 1417) Resp:  [19-24] 19 (01/20 0413) BP: (104-154)/(47-93) 114/60 mmHg (01/20 1417) SpO2:  [94 %-100 %] 94 % (01/20 1123)  Weight change:  Filed Weights   09/19/15 1740 09/20/15 0314  Weight: 113.399 kg (250 lb) 115.667 kg (255 lb)    Intake/Output: I/O last 3 completed shifts: In: 2865 [P.O.:360; I.V.:2505] Out: 2200 [Urine:2200]   Intake/Output this shift:     Physical Exam: General: NAD, resting comfortably.  Head: Normocephalic, atraumatic. Moist oral mucosal membranes  Eyes: Anicteric, PERRL  Neck: Supple, trachea midline  Lungs:  Clear to auscultation normal effort  Heart: Regular rate and rhythm  Abdomen:  Soft, nontender, BS present  Extremities:  no peripheral edema.  Neurologic: Nonfocal, moving all four extremities  Skin: No lesions  Access: none    Basic Metabolic Panel:  Recent Labs Lab 09/19/15 1801 09/20/15 0532 09/21/15 0904 09/22/15 0548  NA 141 141 139 137  K 5.4* 5.1 6.3* 5.3*  CL 112* 115* 114* 111  CO2 21* 23 19* 19*  GLUCOSE 82 94 104* 88  BUN 63* 60* 68* 61*  CREATININE 5.21* 5.04* 4.92* 5.01*  CALCIUM 7.7* 7.4* 7.5* 7.2*  PHOS  --   --  4.1 5.3*    Liver Function Tests:  Recent Labs Lab 09/21/15 0904 09/22/15 0548  ALBUMIN 3.2* 3.1*   No results for input(s): LIPASE, AMYLASE in the last 168 hours. No results for input(s): AMMONIA in the last 168 hours.  CBC:  Recent Labs Lab 09/19/15 1801 09/20/15 0532 09/22/15 0548  WBC 6.9 6.0 5.4   NEUTROABS 5.1  --   --   HGB 12.4* 10.9* 9.8*  HCT 37.4* 32.4* 30.2*  MCV 90.3 90.0 89.1  PLT 144* 123* 106*    Cardiac Enzymes:  Recent Labs Lab 09/19/15 1801 09/19/15 2345 09/20/15 0532 09/20/15 1132 09/20/15 1344  TROPONINI 0.04* 0.06* 0.04* 0.05* 0.04*    BNP: Invalid input(s): POCBNP  CBG: No results for input(s): GLUCAP in the last 168 hours.  Microbiology: No results found for this or any previous visit.  Coagulation Studies: No results for input(s): LABPROT, INR in the last 72 hours.  Urinalysis:  Recent Labs  09/20/15 1537  COLORURINE STRAW*  LABSPEC 1.009  PHURINE 6.0  GLUCOSEU 50*  HGBUR NEGATIVE  BILIRUBINUR NEGATIVE  KETONESUR NEGATIVE  PROTEINUR >500*  NITRITE NEGATIVE  LEUKOCYTESUR NEGATIVE      Imaging: Ct Abdomen Pelvis Wo Contrast  09/21/2015  CLINICAL DATA:  Hydronephrosis. Hypertension. Chronic low back pain. Scoliosis. Stage 4 chronic kidney disease. EXAM: CT ABDOMEN AND PELVIS WITHOUT CONTRAST TECHNIQUE: Multidetector CT imaging of the abdomen and pelvis was performed following the standard protocol without IV contrast. COMPARISON:  09/21/2015 FINDINGS: Lower chest: Mild airway thickening in both lower lobes. Mild cardiomegaly. Hepatobiliary: Unremarkable Pancreas: Unremarkable Spleen: Unremarkable Adrenals/Urinary Tract: Adrenal glands normal. Bilateral renal atrophy. Several small marginal cysts are present in the kidneys. No hydronephrosis or hydroureter. No stones identified. Stomach/Bowel: Orally administered contrast extends through to the colon. Frothy air fluid  level in the rectum suggesting diarrheal process. Vascular/Lymphatic: Upper normal sized peripancreatic and porta hepatis lymph nodes. No significant atherosclerotic calcification in the abdomen or pelvis. Reproductive: Scattered prostate gland calcifications primarily in the central zone but potentially also in the left peripheral zone. Other: No supplemental non-categorized  findings. Musculoskeletal: Fatty left spermatic cord. IMPRESSION: 1. No adrenal mass. No significant atherosclerosis to suggest renal artery stenosis. 2. Several small suspected cysts of the kidneys. Renal atrophy. No hydronephrosis currently. No hydroureter. 3. Airway thickening is present, suggesting bronchitis or reactive airways disease. 4. Mild cardiomegaly. 5. Scattered prostate gland calcifications. Electronically Signed   By: Van Clines M.D.   On: 09/21/2015 17:09   Ct Head Wo Contrast  09/21/2015  CLINICAL DATA:  Change in mental status. EXAM: CT HEAD WITHOUT CONTRAST TECHNIQUE: Contiguous axial images were obtained from the base of the skull through the vertex without intravenous contrast. COMPARISON:  None. FINDINGS: There is no intra or extra-axial fluid collection or mass lesion. The basilar cisterns and ventricles have a normal appearance. There is no CT evidence for acute infarction or hemorrhage. Bone windows are unremarkable. The paranasal and mastoid air cells are normally aerated. IMPRESSION: No evidence for acute intracranial abnormality. Electronically Signed   By: Nolon Nations M.D.   On: 09/21/2015 13:17   US Renal  09/21/2015  CLINICAL DATA:  Acute renal failure. EXAM: RENAL / URINARY TRACT ULTRASOUND COMPLETE COMPARISON:  None. FINDINGS: Right Kidney: Length: 8.3 cm. Right kidney is echogenic consistent chronic medical renal disease. Mild moderate hydronephrosis. 1.0 cm simple cyst upper pole. Left Kidney: Length: 9.7 cm. Left kidney is echogenic consistent chronic medical renal disease. Mild hydronephrosis. 1.7 cm simple cyst lower pole. Bladder: Appears normal for degree of bladder distention. Prevoid volume 319 cc, postvoid volume 19 cc. Hydronephrosis does not change following voiding. IMPRESSION: Kidneys are echogenic bilaterally consistent chronic medical renal disease. Mild bilateral hydronephrosis. No bladder distention. Electronically Signed   By: Marcello Moores  Register    On: 09/21/2015 12:28   Dg Abd 2 Views  09/21/2015  CLINICAL DATA:  Patient complaining of abdominal pain. History of hypertension and anxiety. No chest pain or sob. EXAM: ABDOMEN - 2 VIEW COMPARISON:  None. FINDINGS: There is no free intraperitoneal air beneath the diaphragm. Supine and erect views of the abdomen demonstrate air-fluid levels in loops of ascending large bowel which is a nonspecific appearance. No evidence for bowel dilatation. Scoliosis. No organomegaly. Prostatic calcifications noted. IMPRESSION: 1. No evidence for bowel obstruction. 2. No free intraperitoneal air. Electronically Signed   By: Nolon Nations M.D.   On: 09/21/2015 14:12     Medications:   . sodium chloride 100 mL/hr at 09/22/15 1430   . aspirin EC  81 mg Oral Daily  . carvedilol  12.5 mg Oral BID WC  . cloNIDine  0.1 mg Oral BID  . heparin  5,000 Units Subcutaneous 3 times per day  . hydrALAZINE  100 mg Oral 3 times per day   acetaminophen **OR** acetaminophen, hydrALAZINE, morphine injection, ondansetron **OR** ondansetron (ZOFRAN) IV, oxyCODONE, sodium chloride  Assessment/ Plan:  55 y.o. male  with a PMHx of Hypertension, chronic low back pain, scoliosis, anxiety, CKD stage IV Baseline Cr 3.4 who was admitted to [facility] [place] on 09/19/2015 for evaluation of uncontrolled hypertension.   1. ESRD/proteinuria. The patient has known existing chronic kidney disease. Baseline creatinine 3.4 on 12/28/14.  Etiology of ESRD unclear but most likely secondary to uncontrolled HTN and subsequent focal glomeruloscerlosis. Serologic work up  negative.  Would not be beneficial to biopsy kidneys at this point as we would find scar most likley. -It appeared there was hydronephrosis on renal US, however CT abd/pelvis was negative for this.  Renal function not improved with hydration.  Echogenic kidneys noted on [place], also has proteinuria, anemia of CKD, low calcium, elevated PTH.  These all point to chronicity.  It appears at this time  that the pt has ESRD.  Therefore will proceed with initiation of dialysis.  This was discussed with pt's mother as I don't think pt understood he has ESRD when attempting to explain to him.  Will consult vascular surgery for temp HD catheter.   2. Anemia chronic kidney disease. Hemoglobin down to 9.8.  Will consider starting on epogen with HD.  3. Secondary hyperparathyroidism. PTH 411, phos 5.3.  Phos should improve with HD.    4. Hypertension.  Continue Coreg, clonidine, and hydralazine as BP well controlled.   LOS: 2 Ytzel Gubler 1/20/20172:43 PM

## 2015-09-23 ENCOUNTER — Inpatient Hospital Stay: Payer: Self-pay

## 2015-09-23 LAB — RENAL FUNCTION PANEL
ANION GAP: 3 — AB (ref 5–15)
Albumin: 3.1 g/dL — ABNORMAL LOW (ref 3.5–5.0)
BUN: 70 mg/dL — ABNORMAL HIGH (ref 6–20)
CALCIUM: 7.2 mg/dL — AB (ref 8.9–10.3)
CHLORIDE: 114 mmol/L — AB (ref 101–111)
CO2: 19 mmol/L — AB (ref 22–32)
Creatinine, Ser: 5.15 mg/dL — ABNORMAL HIGH (ref 0.61–1.24)
GFR calc non Af Amer: 12 mL/min — ABNORMAL LOW (ref >60)
GFR, EST AFRICAN AMERICAN: 14 mL/min — AB (ref >60)
Glucose, Bld: 116 mg/dL — ABNORMAL HIGH (ref 65–99)
POTASSIUM: 5.3 mmol/L — AB (ref 3.5–5.1)
Phosphorus: 5.2 mg/dL — ABNORMAL HIGH (ref 2.5–4.6)
SODIUM: 136 mmol/L (ref 135–145)

## 2015-09-23 LAB — CBC
HEMATOCRIT: 29.3 % — AB (ref 40.0–52.0)
HEMOGLOBIN: 9.5 g/dL — AB (ref 13.0–18.0)
MCH: 29 pg (ref 26.0–34.0)
MCHC: 32.2 g/dL (ref 32.0–36.0)
MCV: 89.9 fL (ref 80.0–100.0)
Platelets: 95 10*3/uL — ABNORMAL LOW (ref 150–440)
RBC: 3.26 MIL/uL — AB (ref 4.40–5.90)
RDW: 14 % (ref 11.5–14.5)
WBC: 5.4 10*3/uL (ref 3.8–10.6)

## 2015-09-23 LAB — HEPATITIS B SURFACE ANTIBODY, QUANTITATIVE

## 2015-09-23 LAB — HEPATITIS B CORE ANTIBODY, TOTAL: Hep B Core Total Ab: NEGATIVE

## 2015-09-23 LAB — HEPATITIS B SURFACE ANTIGEN: HEP B S AG: NEGATIVE

## 2015-09-23 LAB — HEPATITIS C ANTIBODY

## 2015-09-23 MED ORDER — SODIUM CHLORIDE 0.9 % IV SOLN: 100.0000 mL | INTRAVENOUS | Status: DC | PRN

## 2015-09-23 MED ORDER — ALTEPLASE 2 MG IJ SOLR
2.0000 mg | Freq: Once | INTRAMUSCULAR | Status: DC | PRN
  Filled 2015-09-23: qty 2

## 2015-09-23 MED ORDER — HEPARIN SODIUM (PORCINE) 1000 UNIT/ML DIALYSIS
1000.0000 [IU] | INTRAMUSCULAR | Status: DC | PRN
  Filled 2015-09-23: qty 1

## 2015-09-23 MED ORDER — PENTAFLUOROPROP-TETRAFLUOROETH EX AERO
1.0000 "application " | INHALATION_SPRAY | CUTANEOUS | Status: DC | PRN
  Filled 2015-09-23: qty 30

## 2015-09-23 MED ORDER — LIDOCAINE HCL (PF) 1 % IJ SOLN
5.0000 mL | INTRAMUSCULAR | Status: DC | PRN
  Filled 2015-09-23: qty 5

## 2015-09-23 MED ORDER — LORAZEPAM 2 MG/ML IJ SOLN
1.0000 mg | Freq: Once | INTRAMUSCULAR | Status: AC
  Administered 2015-09-23: 1 mg via INTRAVENOUS
  Filled 2015-09-23: qty 1

## 2015-09-23 MED ORDER — LIDOCAINE-PRILOCAINE 2.5-2.5 % EX CREA
1.0000 "application " | TOPICAL_CREAM | CUTANEOUS | Status: DC | PRN
  Filled 2015-09-23: qty 5

## 2015-09-23 NOTE — Progress Notes (Signed)
Attempted to obtain consent for vascular procedure at beginning of shift as well as during morning medication pass. Patient declined to sign consent both times. Will monitor.

## 2015-09-23 NOTE — Progress Notes (Signed)
[facility] [place] NAME: Ronnie Keith    MR#:  [id number]  DATE OF BIRTH:  1960-10-01  SUBJECTIVE:  Discussed at length with pt and mother about need to start HD. Pt now agreeable after all questions answered.  REVIEW OF SYSTEMS:   Review of Systems  Constitutional: Negative for fever, chills and weight loss.  HENT: Negative for ear discharge, ear pain and nosebleeds.   Eyes: Negative for blurred vision, pain and discharge.  Respiratory: Negative for sputum production, shortness of breath, wheezing and stridor.   Cardiovascular: Positive for leg swelling. Negative for chest pain, palpitations, orthopnea and PND.  Gastrointestinal: Positive for nausea. Negative for vomiting, abdominal pain and diarrhea.  Genitourinary: Negative for urgency and frequency.  Musculoskeletal: Negative for back pain and joint pain.  Neurological: Negative for sensory change, speech change, focal weakness and weakness.  Psychiatric/Behavioral: Negative for depression and hallucinations. The patient is not nervous/anxious.    Tolerating Diet:yes Tolerating PT: not needed  DRUG ALLERGIES:  No Known Allergies  VITALS:  Blood pressure 131/78, pulse 79, temperature 97.7 F (36.5 C), temperature source Oral, resp. rate 20, height 6' (1.829 m), weight 115.667 kg (255 lb), SpO2 97 %.  PHYSICAL EXAMINATION:   Physical Exam  GENERAL:  55 y.o.-year-old patient lying in the bed with no acute distress.  EYES: Pupils equal, round, reactive to light and accommodation. No scleral icterus. Extraocular muscles intact.  HEENT: Head atraumatic, normocephalic. Oropharynx and nasopharynx clear.  NECK:  Supple, no jugular venous distention. No thyroid enlargement, no tenderness.  LUNGS: Normal breath sounds bilaterally, no wheezing, rales, rhonchi. No use of accessory muscles of respiration.  CARDIOVASCULAR: S1, S2 normal. No murmurs, rubs, or gallops.  ABDOMEN: Soft,  nontender, nondistended. Bowel sounds present. No organomegaly or mass.  EXTREMITIES: No cyanosis, clubbing , ++ edema b/l.    NEUROLOGIC: Cranial nerves II through XII are intact. No focal Motor or sensory deficits b/l.   PSYCHIATRIC: The patient is alert and oriented x 3.  SKIN: No obvious rash, lesion, or ulcer.   LABORATORY PANEL:  CBC  Recent Labs Lab 09/23/15 0600  WBC 5.4  HGB 9.5*  HCT 29.3*  PLT 95*    Chemistries   Recent Labs Lab 09/22/15 0548  NA 137  K 5.3*  CL 111  CO2 19*  GLUCOSE 88  BUN 61*  CREATININE 5.01*  CALCIUM 7.2*    Cardiac Enzymes  Recent Labs Lab 09/20/15 1344  TROPONINI 0.04*   RADIOLOGY:  Ct Abdomen Pelvis Wo Contrast  09/21/2015  CLINICAL DATA:  Hydronephrosis. Hypertension. Chronic low back pain. Scoliosis. Stage 4 chronic kidney disease. EXAM: CT ABDOMEN AND PELVIS WITHOUT CONTRAST TECHNIQUE: Multidetector CT imaging of the abdomen and pelvis was performed following the standard protocol without IV contrast. COMPARISON:  09/21/2015 FINDINGS: Lower chest: Mild airway thickening in both lower lobes. Mild cardiomegaly. Hepatobiliary: Unremarkable Pancreas: Unremarkable Spleen: Unremarkable Adrenals/Urinary Tract: Adrenal glands normal. Bilateral renal atrophy. Several small marginal cysts are present in the kidneys. No hydronephrosis or hydroureter. No stones identified. Stomach/Bowel: Orally administered contrast extends through to the colon. Frothy air fluid level in the rectum suggesting diarrheal process. Vascular/Lymphatic: Upper normal sized peripancreatic and porta hepatis lymph nodes. No significant atherosclerotic calcification in the abdomen or pelvis. Reproductive: Scattered prostate gland calcifications primarily in the central zone but potentially also in the left peripheral zone. Other: No supplemental non-categorized findings. Musculoskeletal: Fatty left spermatic cord. IMPRESSION: 1. No adrenal mass. No  significant  atherosclerosis to suggest renal artery stenosis. 2. Several small suspected cysts of the kidneys. Renal atrophy. No hydronephrosis currently. No hydroureter. 3. Airway thickening is present, suggesting bronchitis or reactive airways disease. 4. Mild cardiomegaly. 5. Scattered prostate gland calcifications. Electronically Signed   By: Van Clines M.D.   On: 09/21/2015 17:09   Dg Chest 1 View  09/23/2015  CLINICAL DATA:  Central line placement EXAM: CHEST 1 VIEW COMPARISON:  None. FINDINGS: Cardiomegaly is noted. Mild basilar atelectasis. Mild elevation of the right hemidiaphragm. No segmental infiltrate or pulmonary edema. There is right IJ central line with tip in SVC right atrium junction. No pneumothorax. Mild mid mediastinal prominence. Adenopathy cannot be excluded. IMPRESSION: Right IJ central line with tip in SVC right atrium junction. No pneumothorax. Cardiomegaly again noted. Mild mediastinal prominence. Although this may be vascular in nature adenopathy cannot be excluded. Electronically Signed   By: Lahoma Crocker M.D.   On: 09/23/2015 16:03   ASSESSMENT AND PLAN:   55 year old Caucasian gentleman history of essential hypertension is presenting with elevated blood pressure.  1. Hypertensive urgency: Continue Coreg, clonidine and hydralazine - blood pressures improved  2. 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 -s/p Right IJ temp cath placement  09/23/15 by Dr schnier  3. Hyperkalemia: Received Kayexalate potassium improved  4. Venous thromboembolism prophylactic: Heparin subcutaneous  Case discussed with Care Management/Social Worker. Management plans discussed with the patient, family and they are in agreement.  CODE STATUS: full  DVT Prophylaxis: heaprin  TOTAL TIME TAKING CARE OF THIS PATIENT: 30 minutes.  >50% time spent on counselling and  coordination of care  POSSIBLE D/C IN 2-3 DAYS, DEPENDING ON CLINICAL CONDITION.  Note: This dictation was prepared with Dragon dictation along with smaller phrase technology. Any transcriptional errors that result from this process are unintentional.  Myleen Brailsford M.D on 09/23/2015 at 4:18 PM  Between 7am to 6pm - Pager - [phone]  After 6pm go to [email or web address] - password [email or web address] [facility]  [facility]  Office  [phone]  CC: Primary care physician; No primary care provider on file.

## 2015-09-23 NOTE — Progress Notes (Signed)
[facility]  ROUNDING NOTE   Subjective:  Patient has now consented to dialysis. The patient's mother was present at the bedside. Creatinine yesterday was 5.01. Hemoglobin lower today at 9.5.   Objective:  Vital signs in last 24 hours:  Temp:  [97.7 F (36.5 C)-98.3 F (36.8 C)] 97.7 F (36.5 C) (01/21 1159) Pulse Rate:  [74-97] 79 (01/21 1315) Resp:  [16-20] 20 (01/21 1159) BP: (106-131)/(56-78) 131/78 mmHg (01/21 1315) SpO2:  [97 %-99 %] 97 % (01/21 1159)  Weight change:  Filed Weights   09/19/15 1740 09/20/15 0314  Weight: 113.399 kg (250 lb) 115.667 kg (255 lb)    Intake/Output: I/O last 3 completed shifts: In: 1251.7 [P.O.:120; I.V.:1131.7] Out: 850 [Urine:850]   Intake/Output this shift:  Total I/O In: 240 [P.O.:240] Out: 200 [Urine:200]  Physical Exam: General: NAD, resting comfortably.  Head: Normocephalic, atraumatic. Moist oral mucosal membranes  Eyes: Anicteric  Neck: Supple, trachea midline  Lungs:  Clear to auscultation normal effort  Heart: Regular rate and rhythm  Abdomen:  Soft, nontender, BS present  Extremities:  no peripheral edema.  Neurologic: Nonfocal, moving all four extremities  Skin: No lesions  Access: none    Basic Metabolic Panel:  Recent Labs Lab 09/19/15 1801 09/20/15 0532 09/21/15 0904 09/22/15 0548  NA 141 141 139 137  K 5.4* 5.1 6.3* 5.3*  CL 112* 115* 114* 111  CO2 21* 23 19* 19*  GLUCOSE 82 94 104* 88  BUN 63* 60* 68* 61*  CREATININE 5.21* 5.04* 4.92* 5.01*  CALCIUM 7.7* 7.4* 7.5* 7.2*  PHOS  --   --  4.1 5.3*    Liver Function Tests:  Recent Labs Lab 09/21/15 0904 09/22/15 0548  ALBUMIN 3.2* 3.1*   No results for input(s): LIPASE, AMYLASE in the last 168 hours. No results for input(s): AMMONIA in the last 168 hours.  CBC:  Recent Labs Lab 09/19/15 1801 09/20/15 0532 09/22/15 0548 09/23/15 0600  WBC 6.9 6.0 5.4 5.4  NEUTROABS 5.1  --   --   --   HGB 12.4* 10.9* 9.8* 9.5*  HCT  37.4* 32.4* 30.2* 29.3*  MCV 90.3 90.0 89.1 89.9  PLT 144* 123* 106* 95*    Cardiac Enzymes:  Recent Labs Lab 09/19/15 1801 09/19/15 2345 09/20/15 0532 09/20/15 1132 09/20/15 1344  TROPONINI 0.04* 0.06* 0.04* 0.05* 0.04*    BNP: Invalid input(s): POCBNP  CBG: No results for input(s): GLUCAP in the last 168 hours.  Microbiology: No results found for this or any previous visit.  Coagulation Studies: No results for input(s): LABPROT, INR in the last 72 hours.  Urinalysis:  Recent Labs  09/20/15 1537  COLORURINE STRAW*  LABSPEC 1.009  PHURINE 6.0  GLUCOSEU 50*  HGBUR NEGATIVE  BILIRUBINUR NEGATIVE  KETONESUR NEGATIVE  PROTEINUR >500*  NITRITE NEGATIVE  LEUKOCYTESUR NEGATIVE      Imaging: Ct Abdomen Pelvis Wo Contrast  09/21/2015  CLINICAL DATA:  Hydronephrosis. Hypertension. Chronic low back pain. Scoliosis. Stage 4 chronic kidney disease. EXAM: CT ABDOMEN AND PELVIS WITHOUT CONTRAST TECHNIQUE: Multidetector CT imaging of the abdomen and pelvis was performed following the standard protocol without IV contrast. COMPARISON:  09/21/2015 FINDINGS: Lower chest: Mild airway thickening in both lower lobes. Mild cardiomegaly. Hepatobiliary: Unremarkable Pancreas: Unremarkable Spleen: Unremarkable Adrenals/Urinary Tract: Adrenal glands normal. Bilateral renal atrophy. Several small marginal cysts are present in the kidneys. No hydronephrosis or hydroureter. No stones identified. Stomach/Bowel: Orally administered contrast extends through to the colon. Frothy air fluid level in the rectum  suggesting diarrheal process. Vascular/Lymphatic: Upper normal sized peripancreatic and porta hepatis lymph nodes. No significant atherosclerotic calcification in the abdomen or pelvis. Reproductive: Scattered prostate gland calcifications primarily in the central zone but potentially also in the left peripheral zone. Other: No supplemental non-categorized findings. Musculoskeletal: Fatty left  spermatic cord. IMPRESSION: 1. No adrenal mass. No significant atherosclerosis to suggest renal artery stenosis. 2. Several small suspected cysts of the kidneys. Renal atrophy. No hydronephrosis currently. No hydroureter. 3. Airway thickening is present, suggesting bronchitis or reactive airways disease. 4. Mild cardiomegaly. 5. Scattered prostate gland calcifications. Electronically Signed   By: Van Clines M.D.   On: 09/21/2015 17:09     Medications:     . aspirin EC  81 mg Oral Daily  . carvedilol  12.5 mg Oral BID WC  . cloNIDine  0.1 mg Oral BID  . heparin  5,000 Units Subcutaneous 3 times per day  . hydrALAZINE  25 mg Oral 3 times per day  . LORazepam  1 mg Intravenous Once  . tuberculin  5 Units Intradermal Once   acetaminophen **OR** acetaminophen, hydrALAZINE, morphine injection, ondansetron **OR** ondansetron (ZOFRAN) IV, oxyCODONE, sodium chloride  Assessment/ Plan:  55 y.o. male  with a PMHx of Hypertension, chronic low back pain, scoliosis, anxiety, CKD stage IV Baseline Cr 3.4 who was admitted to [facility] on 09/19/2015 for evaluation of uncontrolled hypertension.   1. ESRD/proteinuria. The patient has known existing chronic kidney disease. Baseline creatinine 3.4 on 12/28/14.  Etiology of ESRD unclear but most likely secondary to uncontrolled HTN and subsequent focal glomeruloscerlosis. Serologic work up negative.  Would not be beneficial to biopsy kidneys at this point as we would find scar most likley. -Patient will have temporary hemodialysis catheter placed today. He will need PermCath early next week.  He has decided upon placement in the [place].  Will consider referral for transplant as an outpt.   2. Anemia chronic kidney disease. Hemoglobin currently 9.5, consider starting epogen, but hold off for now.  3. Secondary hyperparathyroidism. PTH 411, phos 5.3.  Phos should improve with HD.    4. Hypertension.  Continue Coreg, clonidine, and  hydralazine, BP currently 106/56.    LOS: 3 [place]ford Leinen 1/21/20171:34 PM

## 2015-09-23 NOTE — Progress Notes (Signed)
  OPERATIVE NOTE   PROCEDURE: 1. Insertion of temporary dialysis catheter catheter right internal jugular approach.  PRE-OPERATIVE DIAGNOSIS: Acute on chronic renal insufficiency; malignant hypertension  POST-OPERATIVE DIAGNOSIS: Same  SURGEON: Katha Cabal M.D.  ANESTHESIA: 1% lidocaine local infiltration  ESTIMATED BLOOD LOSS: Minimal cc  INDICATIONS:   Ronnie Keith is a 55 y.o. male who presents with profound uremia secondary to his acute on chronic renal failure.  DESCRIPTION: After obtaining full informed written consent, the patient was positioned supine. The right neck and chest was prepped and draped in a sterile fashion. Ultrasound was placed in a sterile sleeve. Ultrasound was utilized to identify the right internal jugular vein which is noted to be echolucent and compressible indicating patency. Images recorded for the permanent record. Under real-time visualization a Seldinger needle is inserted into the vein and the guidewires advanced without difficulty. Small counterincision was made at the wire insertion site. Dilator is passed over the wire and the temporary dialysis catheter catheter is fed over the wire without difficulty.  All lumens aspirate and flush easily and are packed with heparin saline. Catheter secured to the skin of the right neck with 2-0 silk. A sterile dressing is applied with Biopatch.  COMPLICATIONS: None  CONDITION: Good  Katha Cabal, M.D. [facility]. Office:  [phone]

## 2015-09-24 LAB — CBC
HEMATOCRIT: 29.9 % — AB (ref 40.0–52.0)
Hemoglobin: 9.8 g/dL — ABNORMAL LOW (ref 13.0–18.0)
MCH: 29.6 pg (ref 26.0–34.0)
MCHC: 32.9 g/dL (ref 32.0–36.0)
MCV: 90 fL (ref 80.0–100.0)
PLATELETS: 86 10*3/uL — AB (ref 150–440)
RBC: 3.32 MIL/uL — ABNORMAL LOW (ref 4.40–5.90)
RDW: 13.6 % (ref 11.5–14.5)
WBC: 5.1 10*3/uL (ref 3.8–10.6)

## 2015-09-24 LAB — RENAL FUNCTION PANEL
ALBUMIN: 3.4 g/dL — AB (ref 3.5–5.0)
Anion gap: 5 (ref 5–15)
BUN: 56 mg/dL — AB (ref 6–20)
CALCIUM: 7.6 mg/dL — AB (ref 8.9–10.3)
CO2: 22 mmol/L (ref 22–32)
Chloride: 110 mmol/L (ref 101–111)
Creatinine, Ser: 4.91 mg/dL — ABNORMAL HIGH (ref 0.61–1.24)
GFR calc Af Amer: 14 mL/min — ABNORMAL LOW (ref >60)
GFR, EST NON AFRICAN AMERICAN: 12 mL/min — AB (ref >60)
Glucose, Bld: 140 mg/dL — ABNORMAL HIGH (ref 65–99)
PHOSPHORUS: 4.2 mg/dL (ref 2.5–4.6)
POTASSIUM: 4.7 mmol/L (ref 3.5–5.1)
SODIUM: 137 mmol/L (ref 135–145)

## 2015-09-24 NOTE — Progress Notes (Signed)
Met with family member (mother) around 9:00 a.m. Ronnie Keith was laying in the bed sleep.. Returned around 11:20. Ronnie Keith was sitting up in the chair sleep. [name] [phone]

## 2015-09-24 NOTE — Progress Notes (Signed)
Patient received 1 st treatment of dialysis today, tolerated.1l and half removed as per dialysis nurse report, patient compliant with care,  Dialysis cath site  right internal jugular bleeding slightly dressing reinforced, PRN pain med administer as per pt request, will continue to monitor.

## 2015-09-24 NOTE — Progress Notes (Signed)
[facility]  ROUNDING NOTE   Subjective:  Patient had hemodialysis after right internal jugular temp read Allises catheter was placed yesterday. He tolerated this well. Seems to be in slightly better spirits today.   Objective:  Vital signs in last 24 hours:  Temp:  [97.1 F (36.2 C)-98.2 F (36.8 C)] 97.9 F (36.6 C) (01/22 1128) Pulse Rate:  [77-94] 78 (01/22 1128) Resp:  [20-22] 22 (01/22 1128) BP: (124-148)/(61-92) 134/69 mmHg (01/22 1128) SpO2:  [96 %-100 %] 96 % (01/22 1128) Weight:  [122.199 kg (269 lb 6.4 oz)-122.9 kg (270 lb 15.1 oz)] 122.199 kg (269 lb 6.4 oz) (01/21 2244)  Weight change:  Filed Weights   09/20/15 0314 09/23/15 2045 09/23/15 2244  Weight: 115.667 kg (255 lb) 122.9 kg (270 lb 15.1 oz) 122.199 kg (269 lb 6.4 oz)    Intake/Output: I/O last 3 completed shifts: In: 240 [P.O.:240] Out: 2720 [Urine:2720]   Intake/Output this shift:     Physical Exam: General: NAD, resting comfortably.  Head: Normocephalic, atraumatic. Moist oral mucosal membranes  Eyes: Anicteric, PERRL  Neck: Supple, trachea midline  Lungs:  Clear to auscultation normal effort  Heart: Regular rate and rhythm  Abdomen:  Soft, nontender, BS present  Extremities:  no peripheral edema.  Neurologic: Nonfocal, moving all four extremities  Skin: No lesions  Access: none    Basic Metabolic Panel:  Recent Labs Lab 09/19/15 1801 09/20/15 0532 09/21/15 0904 09/22/15 0548 09/23/15 2051  NA 141 141 139 137 136  K 5.4* 5.1 6.3* 5.3* 5.3*  CL 112* 115* 114* 111 114*  CO2 21* 23 19* 19* 19*  GLUCOSE 82 94 104* 88 116*  BUN 63* 60* 68* 61* 70*  CREATININE 5.21* 5.04* 4.92* 5.01* 5.15*  CALCIUM 7.7* 7.4* 7.5* 7.2* 7.2*  PHOS  --   --  4.1 5.3* 5.2*    Liver Function Tests:  Recent Labs Lab 09/21/15 0904 09/22/15 0548 09/23/15 2051  ALBUMIN 3.2* 3.1* 3.1*   No results for input(s): LIPASE, AMYLASE in the last 168 hours. No results for input(s): AMMONIA in  the last 168 hours.  CBC:  Recent Labs Lab 09/19/15 1801 09/20/15 0532 09/22/15 0548 09/23/15 0600  WBC 6.9 6.0 5.4 5.4  NEUTROABS 5.1  --   --   --   HGB 12.4* 10.9* 9.8* 9.5*  HCT 37.4* 32.4* 30.2* 29.3*  MCV 90.3 90.0 89.1 89.9  PLT 144* 123* 106* 95*    Cardiac Enzymes:  Recent Labs Lab 09/19/15 1801 09/19/15 2345 09/20/15 0532 09/20/15 1132 09/20/15 1344  TROPONINI 0.04* 0.06* 0.04* 0.05* 0.04*    BNP: Invalid input(s): POCBNP  CBG: No results for input(s): GLUCAP in the last 168 hours.  Microbiology: No results found for this or any previous visit.  Coagulation Studies: No results for input(s): LABPROT, INR in the last 72 hours.  Urinalysis: No results for input(s): COLORURINE, LABSPEC, PHURINE, GLUCOSEU, HGBUR, BILIRUBINUR, KETONESUR, PROTEINUR, UROBILINOGEN, NITRITE, LEUKOCYTESUR in the last 72 hours.  Invalid input(s): APPERANCEUR    Imaging: Dg Chest 1 View  09/23/2015  CLINICAL DATA:  Central line placement EXAM: CHEST 1 VIEW COMPARISON:  None. FINDINGS: Cardiomegaly is noted. Mild basilar atelectasis. Mild elevation of the right hemidiaphragm. No segmental infiltrate or pulmonary edema. There is right IJ central line with tip in SVC right atrium junction. No pneumothorax. Mild mid mediastinal prominence. Adenopathy cannot be excluded. IMPRESSION: Right IJ central line with tip in SVC right atrium junction. No pneumothorax. Cardiomegaly again noted. Mild mediastinal  prominence. Although this may be vascular in nature adenopathy cannot be excluded. Electronically Signed   By: Lahoma Crocker M.D.   On: 09/23/2015 16:03     Medications:     . aspirin EC  81 mg Oral Daily  . carvedilol  12.5 mg Oral BID WC  . cloNIDine  0.1 mg Oral BID  . heparin  5,000 Units Subcutaneous 3 times per day  . hydrALAZINE  25 mg Oral 3 times per day  . tuberculin  5 Units Intradermal Once   sodium chloride, sodium chloride, acetaminophen **OR** acetaminophen, alteplase,  heparin, hydrALAZINE, lidocaine (PF), lidocaine-prilocaine, morphine injection, ondansetron **OR** ondansetron (ZOFRAN) IV, oxyCODONE, pentafluoroprop-tetrafluoroeth, sodium chloride  Assessment/ Plan:  55 y.o. male  with a PMHx of Hypertension, chronic low back pain, scoliosis, anxiety, CKD stage IV Baseline Cr 3.4 who was admitted to [facility] on 09/19/2015 for evaluation of uncontrolled hypertension.   1. ESRD/proteinuria. The patient has known existing chronic kidney disease. Baseline creatinine 3.4 on 12/28/14.  Etiology of ESRD unclear but most likely secondary to uncontrolled HTN and subsequent focal glomeruloscerlosis. Serologic work up negative.  Would not be beneficial to biopsy kidneys at this point as we would find scar most likley. Renal US suggested hydronephrosis however on CT scan abd/pelvis this was negative. -Patient had first hemodialysis treatment yesterday. He is due for second dialysis treatment today and third treatment tomorrow. The patient is from [place], [place] and requests placement in the [facility].  PPD placed and negative.  Will continue to monitor closely.  2. Anemia chronic kidney disease. Hgb currently 9.5, continue to monitor, will consider epogen/mircera as outpt.  3. Secondary hyperparathyroidism. PTH 411, phos 5.3.  Phos should improve with HD.    4. Hypertension.  Blood pressure now under good control, continue Coreg, clonidine, and hydralazine as BP well controlled.   LOS: 4 Kris No 1/22/20172:30 PM

## 2015-09-24 NOTE — Progress Notes (Signed)
[facility] [facility] [place] NAME: Ronnie Keith    MR#:  [id number]  DATE OF BIRTH:  10/20/1960  SUBJECTIVE:  Patient started on hemodialysis. Right IJ temporary catheter was placed in yesterday. REVIEW OF SYSTEMS:   Review of Systems  Constitutional: Negative for fever, chills and weight loss.  HENT: Negative for ear discharge, ear pain and nosebleeds.   Eyes: Negative for blurred vision, pain and discharge.  Respiratory: Negative for sputum production, shortness of breath, wheezing and stridor.   Cardiovascular: Positive for leg swelling. Negative for chest pain, palpitations, orthopnea and PND.  Gastrointestinal: Positive for nausea. Negative for vomiting, abdominal pain and diarrhea.  Genitourinary: Negative for urgency and frequency.  Musculoskeletal: Negative for back pain and joint pain.  Neurological: Negative for sensory change, speech change, focal weakness and weakness.  Psychiatric/Behavioral: Negative for depression and hallucinations. The patient is not nervous/anxious.    Tolerating Diet:yes Tolerating PT: not needed  DRUG ALLERGIES:  No Known Allergies  VITALS:  Blood pressure 134/69, pulse 78, temperature 97.9 F (36.6 C), temperature source Oral, resp. rate 22, height 6' (1.829 m), weight 122.199 kg (269 lb 6.4 oz), SpO2 96 %.  PHYSICAL EXAMINATION:   Physical Exam  GENERAL:  55 y.o.-year-old patient lying in the bed with no acute distress.  EYES: Pupils equal, round, reactive to light and accommodation. No scleral icterus. Extraocular muscles intact.  HEENT: Head atraumatic, normocephalic. Oropharynx and nasopharynx clear.  NECK:  Supple, no jugular venous distention. No thyroid enlargement, no tenderness.  LUNGS: Normal breath sounds bilaterally, no wheezing, rales, rhonchi. No use of accessory muscles of respiration.  CARDIOVASCULAR: S1, S2 normal. No murmurs, rubs, or gallops.  ABDOMEN: Soft, nontender,  nondistended. Bowel sounds present. No organomegaly or mass.  EXTREMITIES: No cyanosis, clubbing , ++ edema b/l.    NEUROLOGIC: Cranial nerves II through XII are intact. No focal Motor or sensory deficits b/l.   PSYCHIATRIC: The patient is alert and oriented x 3.  SKIN: No obvious rash, lesion, or ulcer.   LABORATORY PANEL:  CBC  Recent Labs Lab 09/23/15 0600  WBC 5.4  HGB 9.5*  HCT 29.3*  PLT 95*    Chemistries   Recent Labs Lab 09/23/15 2051  NA 136  K 5.3*  CL 114*  CO2 19*  GLUCOSE 116*  BUN 70*  CREATININE 5.15*  CALCIUM 7.2*    Cardiac Enzymes  Recent Labs Lab 09/20/15 1344  TROPONINI 0.04*   RADIOLOGY:  Dg Chest 1 View  09/23/2015  CLINICAL DATA:  Central line placement EXAM: CHEST 1 VIEW COMPARISON:  None. FINDINGS: Cardiomegaly is noted. Mild basilar atelectasis. Mild elevation of the right hemidiaphragm. No segmental infiltrate or pulmonary edema. There is right IJ central line with tip in SVC right atrium junction. No pneumothorax. Mild mid mediastinal prominence. Adenopathy cannot be excluded. IMPRESSION: Right IJ central line with tip in SVC right atrium junction. No pneumothorax. Cardiomegaly again noted. Mild mediastinal prominence. Although this may be vascular in nature adenopathy cannot be excluded. Electronically Signed   By: Lahoma Crocker M.D.   On: 09/23/2015 16:03   ASSESSMENT AND PLAN:   55 year old Caucasian gentleman history of essential hypertension is presenting with elevated blood pressure.  1. Hypertensive urgency: Continue Coreg, clonidine and hydralazine - blood pressures improved  2. End-stage renal disease now on hemodialysis -Tolerated first treatment -s/p Right IJ temp cath placement  09/23/15 by Dr Delana Meyer -Patient will need permanent catheter next week. Vascular consultation  placed.  3. Hyperkalemia: Received Kayexalate potassium improved -Resolved -DC telemetry  4. Venous thromboembolism prophylactic: Heparin  subcutaneous  Case discussed with Care Management/Social Worker. Management plans discussed with the patient, family and they are in agreement.  CODE STATUS: full  DVT Prophylaxis: heaprin  TOTAL TIME TAKING CARE OF THIS PATIENT: 30 minutes.  >50% time spent on counselling and coordination of care dr Holley Raring  POSSIBLE D/C IN 2-3 DAYS, DEPENDING ON CLINICAL CONDITION.  Note: This dictation was prepared with Dragon dictation along with smaller phrase technology. Any transcriptional errors that result from this process are unintentional.  Angelena Sand M.D on 09/24/2015 at 12:46 PM  Between 7am to 6pm - Pager - [phone]  After 6pm go to [email or web address] - password [email or web address] [facility]  [place] [facility]  Office  [phone]  CC: Primary care physician; No primary care provider on file.

## 2015-09-25 ENCOUNTER — Inpatient Hospital Stay: Payer: Self-pay

## 2015-09-25 LAB — RENAL FUNCTION PANEL
ALBUMIN: 3.2 g/dL — AB (ref 3.5–5.0)
ANION GAP: 8 (ref 5–15)
BUN: 38 mg/dL — ABNORMAL HIGH (ref 6–20)
CALCIUM: 7.9 mg/dL — AB (ref 8.9–10.3)
CO2: 27 mmol/L (ref 22–32)
Chloride: 104 mmol/L (ref 101–111)
Creatinine, Ser: 4.08 mg/dL — ABNORMAL HIGH (ref 0.61–1.24)
GFR, EST AFRICAN AMERICAN: 18 mL/min — AB (ref >60)
GFR, EST NON AFRICAN AMERICAN: 15 mL/min — AB (ref >60)
GLUCOSE: 102 mg/dL — AB (ref 65–99)
PHOSPHORUS: 4 mg/dL (ref 2.5–4.6)
POTASSIUM: 4.6 mmol/L (ref 3.5–5.1)
SODIUM: 139 mmol/L (ref 135–145)

## 2015-09-25 LAB — INFLUENZA PANEL BY PCR (TYPE A & B)
H1N1FLUPCR: NOT DETECTED
INFLAPCR: NEGATIVE
INFLBPCR: NEGATIVE

## 2015-09-25 LAB — URINALYSIS COMPLETE WITH MICROSCOPIC (ARMC ONLY)
BILIRUBIN URINE: NEGATIVE
Bacteria, UA: NONE SEEN
Glucose, UA: 50 mg/dL — AB
HGB URINE DIPSTICK: NEGATIVE
Leukocytes, UA: NEGATIVE
NITRITE: NEGATIVE
PH: 7 (ref 5.0–8.0)
Protein, ur: >500 mg/dL — AB
SPECIFIC GRAVITY, URINE: 1.01 (ref 1.005–1.030)
Squamous Epithelial / LPF: NONE SEEN

## 2015-09-25 LAB — RAPID HIV SCREEN (HIV 1/2 AB+AG)
HIV 1/2 Antibodies: NONREACTIVE
HIV-1 P24 ANTIGEN - HIV24: NONREACTIVE

## 2015-09-25 LAB — COMPREHENSIVE METABOLIC PANEL
ALBUMIN: 3.1 g/dL — AB (ref 3.5–5.0)
ALK PHOS: 107 U/L (ref 38–126)
ALT: 16 U/L — ABNORMAL LOW (ref 17–63)
ANION GAP: 7 (ref 5–15)
AST: 13 U/L — ABNORMAL LOW (ref 15–41)
BILIRUBIN TOTAL: 0.8 mg/dL (ref 0.3–1.2)
BUN: 24 mg/dL — ABNORMAL HIGH (ref 6–20)
CALCIUM: 7.8 mg/dL — AB (ref 8.9–10.3)
CO2: 30 mmol/L (ref 22–32)
Chloride: 101 mmol/L (ref 101–111)
Creatinine, Ser: 3.39 mg/dL — ABNORMAL HIGH (ref 0.61–1.24)
GFR calc non Af Amer: 19 mL/min — ABNORMAL LOW (ref >60)
GFR, EST AFRICAN AMERICAN: 22 mL/min — AB (ref >60)
GLUCOSE: 117 mg/dL — AB (ref 65–99)
POTASSIUM: 4.1 mmol/L (ref 3.5–5.1)
Sodium: 138 mmol/L (ref 135–145)
TOTAL PROTEIN: 5.9 g/dL — AB (ref 6.5–8.1)

## 2015-09-25 LAB — CBC
HEMATOCRIT: 29.6 % — AB (ref 40.0–52.0)
HEMOGLOBIN: 9.9 g/dL — AB (ref 13.0–18.0)
MCH: 29.9 pg (ref 26.0–34.0)
MCHC: 33.6 g/dL (ref 32.0–36.0)
MCV: 89.1 fL (ref 80.0–100.0)
Platelets: 84 10*3/uL — ABNORMAL LOW (ref 150–440)
RBC: 3.32 MIL/uL — AB (ref 4.40–5.90)
RDW: 13.3 % (ref 11.5–14.5)
WBC: 5.2 10*3/uL (ref 3.8–10.6)

## 2015-09-25 LAB — HEPATITIS B SURFACE ANTIBODY,QUALITATIVE: Hep B S Ab: NONREACTIVE

## 2015-09-25 MED ORDER — TUBERCULIN PPD 5 UNIT/0.1ML ID SOLN
5.0000 [IU] | Freq: Once | INTRADERMAL | Status: DC
  Filled 2015-09-25: qty 0.1

## 2015-09-25 NOTE — Progress Notes (Signed)
Patient had a PPD placed on 09/22/15 @ 2001.  The results on 09/25/15 at 1235 was negative.

## 2015-09-25 NOTE — Progress Notes (Signed)
[facility] at [place] NAME: Ronnie Keith    MR#:  [id number]  DATE OF BIRTH:  1960/09/22  SUBJECTIVE:  Had fever-chills while at HD earlier today, collected 1 blood c/s (as couldn't collect 2 sets per RN) Right IJ temporary catheter was placed on 1/21, somewhat lethargic per family and RN REVIEW OF SYSTEMS:   Review of Systems  Constitutional: Negative for fever, chills and weight loss.  HENT: Negative for ear discharge, ear pain and nosebleeds.   Eyes: Negative for blurred vision, pain and discharge.  Respiratory: Negative for sputum production, shortness of breath, wheezing and stridor.   Cardiovascular: Positive for leg swelling. Negative for chest pain, palpitations, orthopnea and PND.  Gastrointestinal: Positive for nausea. Negative for vomiting, abdominal pain and diarrhea.  Genitourinary: Negative for urgency and frequency.  Musculoskeletal: Negative for back pain and joint pain.  Neurological: Negative for sensory change, speech change, focal weakness and weakness.  Psychiatric/Behavioral: Negative for depression and hallucinations. The patient is not nervous/anxious.    Tolerating Diet:yes Tolerating PT: not needed  DRUG ALLERGIES:  No Known Allergies  VITALS:  Blood pressure 160/82, pulse 112, temperature 100.4 F (38 C), temperature source Oral, resp. rate 16, height 6' (1.829 m), weight 117.8 kg (259 lb 11.2 oz), SpO2 93 %. PHYSICAL EXAMINATION:   Physical Exam  GENERAL:  55 y.o.-year-old patient lying in the bed with no acute distress.  EYES: Pupils equal, round, reactive to light and accommodation. No scleral icterus. Extraocular muscles intact.  HEENT: Head atraumatic, normocephalic. Oropharynx and nasopharynx clear.  NECK:  Supple, no jugular venous distention. No thyroid enlargement, no tenderness.  LUNGS: Normal breath sounds bilaterally, no wheezing, rales, rhonchi. No use of accessory muscles of respiration.   CARDIOVASCULAR: S1, S2 normal. No murmurs, rubs, or gallops.  ABDOMEN: Soft, nontender, nondistended. Bowel sounds present. No organomegaly or mass.  EXTREMITIES: No cyanosis, clubbing , ++ edema b/l.    NEUROLOGIC: Cranial nerves II through XII are intact. No focal Motor or sensory deficits b/l.   PSYCHIATRIC: The patient is alert and oriented x 3.  SKIN: No obvious rash, lesion, or ulcer.   LABORATORY PANEL:  CBC  Recent Labs Lab 09/25/15 0938  WBC 5.2  HGB 9.9*  HCT 29.6*  PLT 84*    Chemistries   Recent Labs Lab 09/25/15 0938  NA 139  K 4.6  CL 104  CO2 27  GLUCOSE 102*  BUN 38*  CREATININE 4.08*  CALCIUM 7.9*    Cardiac Enzymes  Recent Labs Lab 09/20/15 1344  TROPONINI 0.04*   RADIOLOGY:  Dg Chest 1 View  09/23/2015  CLINICAL DATA:  Central line placement EXAM: CHEST 1 VIEW COMPARISON:  None. FINDINGS: Cardiomegaly is noted. Mild basilar atelectasis. Mild elevation of the right hemidiaphragm. No segmental infiltrate or pulmonary edema. There is right IJ central line with tip in SVC right atrium junction. No pneumothorax. Mild mid mediastinal prominence. Adenopathy cannot be excluded. IMPRESSION: Right IJ central line with tip in SVC right atrium junction. No pneumothorax. Cardiomegaly again noted. Mild mediastinal prominence. Although this may be vascular in nature adenopathy cannot be excluded. Electronically Signed   By: Lahoma Crocker M.D.   On: 09/23/2015 16:03   ASSESSMENT AND PLAN:   55 year old Caucasian gentleman history of essential hypertension is presenting with elevated blood pressure.  1. SIRS: FEVER, TACHYCARDIA. Will get septic w/up including UA, CXR, BLOOD C/S, ID c/s. Hold off abx, monitor fever curve  2. Hypertensive  urgency: Continue Coreg, clonidine and hydralazine - blood pressures improved  3. End-stage renal disease now on hemodialysis -Tolerated first treatment -s/p Right IJ temp cath placement  09/23/15 by Dr Delana Meyer -Patient will  need permanent catheter this week - d/w OR nurse for Vascular for Dr Lucky Cowboy for perm cath placement.  4. Hyperkalemia: Received Kayexalate potassium improved -Resolved -off telemetry  * Venous thromboembolism prophylactic: Heparin subcutaneous  Case discussed with Care Management/Social Worker. Management plans discussed with the patient, family and they are in agreement.  CODE STATUS: full  DVT Prophylaxis: heparin  TOTAL TIME TAKING CARE OF THIS PATIENT: 30 minutes.   >50% time spent on counselling and coordination of care dr Lucky Cowboy  POSSIBLE D/C IN 2-3 DAYS, DEPENDING ON CLINICAL CONDITION.  Note: This dictation was prepared with Dragon dictation along with smaller phrase technology. Any transcriptional errors that result from this process are unintentional.  [facility], Ronnie Keith M.D on 09/25/2015 at 2:16 PM  Between 7am to 6pm - Pager - [phone]  After 6pm go to [email or web address] - password [email or web address] [facility]  [place] [facility]  Office  [phone]  CC: Primary care physician; No primary care provider on file.

## 2015-09-25 NOTE — Progress Notes (Signed)
HD Tx Initiation 

## 2015-09-25 NOTE — Progress Notes (Signed)
Pre HD Tx Machine & Patient Checks 

## 2015-09-25 NOTE — Progress Notes (Signed)
Per Dr. Manuella Ghazi place order for Blood cultures x2. Pt experiencing fevers and chills in dialysis.

## 2015-09-25 NOTE — Progress Notes (Addendum)
Per Dr. Manuella Ghazi okay to draw labs and second blood culture from purple port of dialysis catheter.

## 2015-09-25 NOTE — Progress Notes (Addendum)
Per Dr. Manuella Ghazi to go ahead and give tylenol for temp of 100.4, even though prn order states greater than 101. Notified MD that lab was not able to collect second set of blood cultures.

## 2015-09-25 NOTE — Consult Note (Signed)
[facility] Infectious Disease     Reason for Consult: Fever    Referring Physician: Max Sane Date of Admission:  09/19/2015   Active Problems:   Hypertensive urgency   Acute kidney injury ([place])   Hyperkalemia   HPI: Ronnie Keith is a 55 y.o. male with PMHx of Hypertension, chronic low back pain, scoliosis, anxiety, CKD stage IV Baseline Cr 3.4 who was admitted to [facility] on 09/19/2015 for evaluation of uncontrolled hypertension and worsening renal function. Since that time has progressed and started on HD.  Had temp cath placed R IJ 1/21. Started fevers to 100.7 1/22. BCX done, wbc nml.  CXR negative.  Currently poor historian, reports some cough, st, legs ache.  Past Medical History  Diagnosis Date  . Hypertension   . Chronic low back pain   . Scoliosis   . Anxiety   . Sciatica    History reviewed. No pertinent past surgical history. Social History  Substance Use Topics  . Smoking status: Never Smoker   . Smokeless tobacco: Never Used  . Alcohol Use: No   Family History  Problem Relation Age of Onset  . Hypertension Other     Allergies: No Known Allergies  Current antibiotics: Antibiotics Given (last 72 hours)    None      MEDICATIONS: . aspirin EC  81 mg Oral Daily  . carvedilol  12.5 mg Oral BID WC  . cloNIDine  0.1 mg Oral BID  . hydrALAZINE  25 mg Oral 3 times per day  . tuberculin  5 Units Intradermal Once    Review of Systems - 11 systems reviewed and negative per HPI   OBJECTIVE: Temp:  [97.5 F (36.4 C)-100.7 F (38.2 C)] 100.4 F (38 C) (01/23 1233) Pulse Rate:  [87-112] 112 (01/23 1233) Resp:  [16-20] 16 (01/23 1233) BP: (116-160)/(52-89) 160/82 mmHg (01/23 1233) SpO2:  [93 %-100 %] 93 % (01/23 1233) Weight:  [117.8 kg (259 lb 11.2 oz)-120 kg (264 lb 8.8 oz)] 117.8 kg (259 lb 11.2 oz) (01/23 1200) Physical Exam  Constitutional: He is alert, some flat affect, minimal interaction. . He appears obese, stittin gup on side of bedHENT: PERRLA,   Mouth/Throat: Oropharynx is clear and dry . No oropharyngeal exudate.  Cardiovascular: Normal rate, regular rhythm and normal heart sounds.  R neck IJ line in placed Pulmonary/Chest: Effort normal and breath sounds normal. No respiratory distress. He has no wheezes.  Abdominal: obses . Bowel sounds are normal. He exhibits no distension. There is no tenderness.  Lymphadenopathy: He has no cervical adenopathy.  Neurological: He is alert and oriented to person, place, and time.  Skin: Skin is warm and dry. No rash noted. No erythema.  Psychiatric: He has a flat affectr  LABS: Results for orders placed or performed during the hospital encounter of 09/19/15 (from the past 48 hour(s))  Renal function panel     Status: Abnormal   Collection Time: 09/23/15  8:51 PM  Result Value Ref Range   Sodium 136 135 - 145 mmol/L   Potassium 5.3 (H) 3.5 - 5.1 mmol/L   Chloride 114 (H) 101 - 111 mmol/L   CO2 19 (L) 22 - 32 mmol/L   Glucose, Bld 116 (H) 65 - 99 mg/dL   BUN 70 (H) 6 - 20 mg/dL   Creatinine, Ser 5.15 (H) 0.61 - 1.24 mg/dL   Calcium 7.2 (L) 8.9 - 10.3 mg/dL   Phosphorus 5.2 (H) 2.5 - 4.6 mg/dL   Albumin 3.1 (L)  3.5 - 5.0 g/dL   GFR calc non Af Amer 12 (L) >60 mL/min   GFR calc Af Amer 14 (L) >60 mL/min    Comment: (NOTE) The eGFR has been calculated using the CKD EPI equation. This calculation has not been validated in all clinical situations. eGFR's persistently <60 mL/min signify possible Chronic Kidney Disease.    Anion gap 3 (L) 5 - 15  Renal function panel     Status: Abnormal   Collection Time: 09/24/15  7:29 PM  Result Value Ref Range   Sodium 137 135 - 145 mmol/L   Potassium 4.7 3.5 - 5.1 mmol/L   Chloride 110 101 - 111 mmol/L   CO2 22 22 - 32 mmol/L   Glucose, Bld 140 (H) 65 - 99 mg/dL   BUN 56 (H) 6 - 20 mg/dL   Creatinine, Ser 4.91 (H) 0.61 - 1.24 mg/dL   Calcium 7.6 (L) 8.9 - 10.3 mg/dL   Phosphorus 4.2 2.5 - 4.6 mg/dL   Albumin 3.4 (L) 3.5 - 5.0 g/dL   GFR calc  non Af Amer 12 (L) >60 mL/min   GFR calc Af Amer 14 (L) >60 mL/min    Comment: (NOTE) The eGFR has been calculated using the CKD EPI equation. This calculation has not been validated in all clinical situations. eGFR's persistently <60 mL/min signify possible Chronic Kidney Disease.    Anion gap 5 5 - 15  CBC     Status: Abnormal   Collection Time: 09/24/15  7:29 PM  Result Value Ref Range   WBC 5.1 3.8 - 10.6 K/uL   RBC 3.32 (L) 4.40 - 5.90 MIL/uL   Hemoglobin 9.8 (L) 13.0 - 18.0 g/dL   HCT 29.9 (L) 40.0 - 52.0 %   MCV 90.0 80.0 - 100.0 fL   MCH 29.6 26.0 - 34.0 pg   MCHC 32.9 32.0 - 36.0 g/dL   RDW 13.6 11.5 - 14.5 %   Platelets 86 (L) 150 - 440 K/uL  Renal function panel     Status: Abnormal   Collection Time: 09/25/15  9:38 AM  Result Value Ref Range   Sodium 139 135 - 145 mmol/L   Potassium 4.6 3.5 - 5.1 mmol/L   Chloride 104 101 - 111 mmol/L   CO2 27 22 - 32 mmol/L   Glucose, Bld 102 (H) 65 - 99 mg/dL   BUN 38 (H) 6 - 20 mg/dL   Creatinine, Ser 4.08 (H) 0.61 - 1.24 mg/dL   Calcium 7.9 (L) 8.9 - 10.3 mg/dL   Phosphorus 4.0 2.5 - 4.6 mg/dL   Albumin 3.2 (L) 3.5 - 5.0 g/dL   GFR calc non Af Amer 15 (L) >60 mL/min   GFR calc Af Amer 18 (L) >60 mL/min    Comment: (NOTE) The eGFR has been calculated using the CKD EPI equation. This calculation has not been validated in all clinical situations. eGFR's persistently <60 mL/min signify possible Chronic Kidney Disease.    Anion gap 8 5 - 15  CBC     Status: Abnormal   Collection Time: 09/25/15  9:38 AM  Result Value Ref Range   WBC 5.2 3.8 - 10.6 K/uL   RBC 3.32 (L) 4.40 - 5.90 MIL/uL   Hemoglobin 9.9 (L) 13.0 - 18.0 g/dL   HCT 29.6 (L) 40.0 - 52.0 %   MCV 89.1 80.0 - 100.0 fL   MCH 29.9 26.0 - 34.0 pg   MCHC 33.6 32.0 - 36.0 g/dL   RDW 13.3 11.5 -  14.5 %   Platelets 84 (L) 150 - 440 K/uL  CULTURE, BLOOD (ROUTINE X 2) w Reflex to PCR ID Panel     Status: None (Preliminary result)   Collection Time: 09/25/15 11:08 AM   Result Value Ref Range   Specimen Description BLOOD LEFT HAND    Special Requests BOTTLES DRAWN AEROBIC AND ANAEROBIC 1CCAERO,2CCANA    Culture NO GROWTH < 12 HOURS    Report Status PENDING    No components found for: ESR, C REACTIVE PROTEIN MICRO: Recent Results (from the past 720 hour(s))  CULTURE, BLOOD (ROUTINE X 2) w Reflex to PCR ID Panel     Status: None (Preliminary result)   Collection Time: 09/25/15 11:08 AM  Result Value Ref Range Status   Specimen Description BLOOD LEFT HAND  Final   Special Requests BOTTLES DRAWN AEROBIC AND ANAEROBIC [place]  Final   Culture NO GROWTH < 12 HOURS  Final   Report Status PENDING  Incomplete    IMAGING: Ct Abdomen Pelvis Wo Contrast  09/21/2015  CLINICAL DATA:  Hydronephrosis. Hypertension. Chronic low back pain. Scoliosis. Stage 4 chronic kidney disease. EXAM: CT ABDOMEN AND PELVIS WITHOUT CONTRAST TECHNIQUE: Multidetector CT imaging of the abdomen and pelvis was performed following the standard protocol without IV contrast. COMPARISON:  09/21/2015 FINDINGS: Lower chest: Mild airway thickening in both lower lobes. Mild cardiomegaly. Hepatobiliary: Unremarkable Pancreas: Unremarkable Spleen: Unremarkable Adrenals/Urinary Tract: Adrenal glands normal. Bilateral renal atrophy. Several small marginal cysts are present in the kidneys. No hydronephrosis or hydroureter. No stones identified. Stomach/Bowel: Orally administered contrast extends through to the colon. Frothy air fluid level in the rectum suggesting diarrheal process. Vascular/Lymphatic: Upper normal sized peripancreatic and porta hepatis lymph nodes. No significant atherosclerotic calcification in the abdomen or pelvis. Reproductive: Scattered prostate gland calcifications primarily in the central zone but potentially also in the left peripheral zone. Other: No supplemental non-categorized findings. Musculoskeletal: Fatty left spermatic cord. IMPRESSION: 1. No adrenal mass. No  significant atherosclerosis to suggest renal artery stenosis. 2. Several small suspected cysts of the kidneys. Renal atrophy. No hydronephrosis currently. No hydroureter. 3. Airway thickening is present, suggesting bronchitis or reactive airways disease. 4. Mild cardiomegaly. 5. Scattered prostate gland calcifications. Electronically Signed   By: Van Clines M.D.   On: 09/21/2015 17:09   Dg Chest 1 View  09/23/2015  CLINICAL DATA:  Central line placement EXAM: CHEST 1 VIEW COMPARISON:  None. FINDINGS: Cardiomegaly is noted. Mild basilar atelectasis. Mild elevation of the right hemidiaphragm. No segmental infiltrate or pulmonary edema. There is right IJ central line with tip in SVC right atrium junction. No pneumothorax. Mild mid mediastinal prominence. Adenopathy cannot be excluded. IMPRESSION: Right IJ central line with tip in SVC right atrium junction. No pneumothorax. Cardiomegaly again noted. Mild mediastinal prominence. Although this may be vascular in nature adenopathy cannot be excluded. Electronically Signed   By: Lahoma Crocker M.D.   On: 09/23/2015 16:03   Ct Head Wo Contrast  09/21/2015  CLINICAL DATA:  Change in mental status. EXAM: CT HEAD WITHOUT CONTRAST TECHNIQUE: Contiguous axial images were obtained from the base of the skull through the vertex without intravenous contrast. COMPARISON:  None. FINDINGS: There is no intra or extra-axial fluid collection or mass lesion. The basilar cisterns and ventricles have a normal appearance. There is no CT evidence for acute infarction or hemorrhage. Bone windows are unremarkable. The paranasal and mastoid air cells are normally aerated. IMPRESSION: No evidence for acute intracranial abnormality. Electronically Signed   By: Benjamine Mola  Owens Shark M.D.   On: 09/21/2015 13:17   US Renal  09/21/2015  CLINICAL DATA:  Acute renal failure. EXAM: RENAL / URINARY TRACT ULTRASOUND COMPLETE COMPARISON:  None. FINDINGS: Right Kidney: Length: 8.3 cm. Right kidney is  echogenic consistent chronic medical renal disease. Mild moderate hydronephrosis. 1.0 cm simple cyst upper pole. Left Kidney: Length: 9.7 cm. Left kidney is echogenic consistent chronic medical renal disease. Mild hydronephrosis. 1.7 cm simple cyst lower pole. Bladder: Appears normal for degree of bladder distention. Prevoid volume 319 cc, postvoid volume 19 cc. Hydronephrosis does not change following voiding. IMPRESSION: Kidneys are echogenic bilaterally consistent chronic medical renal disease. Mild bilateral hydronephrosis. No bladder distention. Electronically Signed   By: Marcello Moores  Register   On: 09/21/2015 12:28   Dg Abd 2 Views  09/21/2015  CLINICAL DATA:  Patient complaining of abdominal pain. History of hypertension and anxiety. No chest pain or sob. EXAM: ABDOMEN - 2 VIEW COMPARISON:  None. FINDINGS: There is no free intraperitoneal air beneath the diaphragm. Supine and erect views of the abdomen demonstrate air-fluid levels in loops of ascending large bowel which is a nonspecific appearance. No evidence for bowel dilatation. Scoliosis. No organomegaly. Prostatic calcifications noted. IMPRESSION: 1. No evidence for bowel obstruction. 2. No free intraperitoneal air. Electronically Signed   By: Nolon Nations M.D.   On: 09/21/2015 14:12    Assessment:   Ronnie Keith is a 55 y.o. male with Htn, CKD now on HD with fevers on day 6 of admission. [place] pending. WBC nml. CXR neg.  UA on admit neg for infection, does not currently have a foley. Likely viral infection with his mild cough, st, leg aches  Recommendations Check flu PCR Check HIV Hold on abx unless source identified or decompensates  Thank you very much for allowing me to participate in the care of this patient. Please call with questions.   Cheral Marker. Ola Spurr, MD

## 2015-09-25 NOTE — Progress Notes (Signed)
Flu screening came back negative, discontinued droplet precaution.

## 2015-09-25 NOTE — Progress Notes (Signed)
Pre HD Tx Assessment 

## 2015-09-25 NOTE — Progress Notes (Signed)
[facility]  ROUNDING NOTE   Subjective:   Seen and examined on hemodialysis treatment. Third treatment. RIJ temp catheter.   Some fevers reported. Blood cultures sent.   Objective:  Vital signs in last 24 hours:  Temp:  [97.5 F (36.4 C)-100.7 F (38.2 C)] 100.7 F (38.2 C) (01/23 0915) Pulse Rate:  [87-112] 108 (01/23 1130) Resp:  [17-20] 18 (01/23 1130) BP: (116-158)/(52-89) 128/52 mmHg (01/23 1130) SpO2:  [95 %-100 %] 95 % (01/23 1130) Weight:  [119.9 kg (264 lb 5.3 oz)-120 kg (264 lb 8.8 oz)] 120 kg (264 lb 8.8 oz) (01/23 0915)  Weight change: -3 kg (-6 lb 9.8 oz) Filed Weights   09/23/15 2244 09/24/15 1915 09/25/15 0915  Weight: 122.199 kg (269 lb 6.4 oz) 119.9 kg (264 lb 5.3 oz) 120 kg (264 lb 8.8 oz)    Intake/Output: I/O last 3 completed shifts: In: 0  Out: 3120 [Urine:2120; Other:1000]   Intake/Output this shift:     Physical Exam: General: NAD, laying in bed.   Head: Normocephalic, atraumatic. Moist oral mucosal membranes  Eyes: Anicteric, PERRL  Neck: Supple, trachea midline  Lungs:  Clear to auscultation normal effort  Heart: Regular rate and rhythm  Abdomen:  Soft, nontender, BS present  Extremities: no peripheral edema.  Neurologic: Nonfocal, moving all four extremities  Skin: No lesions  Access: RIJ temp HD catheter 09/23/2015 Dr. Delana Meyer    Basic Metabolic Panel:  Recent Labs Lab 09/21/15 0904 09/22/15 0548 09/23/15 2051 09/24/15 1929 09/25/15 0938  NA 139 137 136 137 139  K 6.3* 5.3* 5.3* 4.7 4.6  CL 114* 111 114* 110 104  CO2 19* 19* 19* 22 27  GLUCOSE 104* 88 116* 140* 102*  BUN 68* 61* 70* 56* 38*  CREATININE 4.92* 5.01* 5.15* 4.91* 4.08*  CALCIUM 7.5* 7.2* 7.2* 7.6* 7.9*  PHOS 4.1 5.3* 5.2* 4.2 4.0    Liver Function Tests:  Recent Labs Lab 09/21/15 0904 09/22/15 0548 09/23/15 2051 09/24/15 1929 09/25/15 0938  ALBUMIN 3.2* 3.1* 3.1* 3.4* 3.2*   No results for input(s): LIPASE, AMYLASE in the last 168  hours. No results for input(s): AMMONIA in the last 168 hours.  CBC:  Recent Labs Lab 09/19/15 1801 09/20/15 0532 09/22/15 0548 09/23/15 0600 09/24/15 1929 09/25/15 0938  WBC 6.9 6.0 5.4 5.4 5.1 5.2  NEUTROABS 5.1  --   --   --   --   --   HGB 12.4* 10.9* 9.8* 9.5* 9.8* 9.9*  HCT 37.4* 32.4* 30.2* 29.3* 29.9* 29.6*  MCV 90.3 90.0 89.1 89.9 90.0 89.1  PLT 144* 123* 106* 95* 86* 84*    Cardiac Enzymes:  Recent Labs Lab 09/19/15 1801 09/19/15 2345 09/20/15 0532 09/20/15 1132 09/20/15 1344  TROPONINI 0.04* 0.06* 0.04* 0.05* 0.04*    BNP: Invalid input(s): POCBNP  CBG: No results for input(s): GLUCAP in the last 168 hours.  Microbiology: No results found for this or any previous visit.  Coagulation Studies: No results for input(s): LABPROT, INR in the last 72 hours.  Urinalysis: No results for input(s): COLORURINE, LABSPEC, PHURINE, GLUCOSEU, HGBUR, BILIRUBINUR, KETONESUR, PROTEINUR, UROBILINOGEN, NITRITE, LEUKOCYTESUR in the last 72 hours.  Invalid input(s): APPERANCEUR    Imaging: Dg Chest 1 View  09/23/2015  CLINICAL DATA:  Central line placement EXAM: CHEST 1 VIEW COMPARISON:  None. FINDINGS: Cardiomegaly is noted. Mild basilar atelectasis. Mild elevation of the right hemidiaphragm. No segmental infiltrate or pulmonary edema. There is right IJ central line with tip in SVC right  atrium junction. No pneumothorax. Mild mid mediastinal prominence. Adenopathy cannot be excluded. IMPRESSION: Right IJ central line with tip in SVC right atrium junction. No pneumothorax. Cardiomegaly again noted. Mild mediastinal prominence. Although this may be vascular in nature adenopathy cannot be excluded. Electronically Signed   By: Lahoma Crocker M.D.   On: 09/23/2015 16:03     Medications:     . aspirin EC  81 mg Oral Daily  . carvedilol  12.5 mg Oral BID WC  . cloNIDine  0.1 mg Oral BID  . hydrALAZINE  25 mg Oral 3 times per day   sodium chloride, sodium chloride,  acetaminophen **OR** acetaminophen, alteplase, heparin, hydrALAZINE, lidocaine (PF), lidocaine-prilocaine, morphine injection, ondansetron **OR** ondansetron (ZOFRAN) IV, oxyCODONE, pentafluoroprop-tetrafluoroeth, sodium chloride  Assessment/ Plan:  55 y.o.white male  with hypertension, chronic low back pain, scoliosis, anxiety, CKD stage IV Baseline Cr 3.4 who was admitted to [facility] on 09/19/2015 for evaluation of uncontrolled hypertension.   1. End Stage Renal Disease: now completed three treatments of hemodialysis. Baseline creatinine 3.4 on 12/28/14.  Etiology of ESRD unclear but most likely secondary to uncontrolled HTN and subsequent focal glomeruloscerlosis with significant proteinuria. Serologic work up negative.  Would not be beneficial to biopsy kidneys at this point as we would find scar most likley. Renal US suggested hydronephrosis however on CT scan abd/pelvis this was negative. -Patient had first hemodialysis treatment 09/23/15. - Will need tunneled permcath placement before discharge.  - PPD pending.  - Next treatment tentatively for Wednesday.   2. Anemia chronic kidney disease. Hgb 9.9 - will consider epogen/mircera as outpt.  3. Secondary hyperparathyroidism. PTH 411, phos 4.   - not currently on a binder or vitamin D agent.   4. Hypertension.  Blood pressure at goal. -  continue Coreg, clonidine, and hydralazine   LOS: Letcher, Asli Tokarski 1/23/201711:55 AM

## 2015-09-25 NOTE — Progress Notes (Signed)
[facility]  Daily Progress Note   Subjective  - * No surgery found *  Feels pretty lousy.  More lethargic today.  Low grade fevers.  Being evaluated for influenza and blood cultures drawn.  Objective Filed Vitals:   09/25/15 1145 09/25/15 1200 09/25/15 1233 09/25/15 1533  BP: 133/67 129/71 160/82 119/69  Pulse: 106 111 112 105  Temp:  99.6 F (37.6 C) 100.4 F (38 C) 100.2 F (37.9 C)  TempSrc:  Oral Oral Oral  Resp: 16 19 16    Height:      Weight:  117.8 kg (259 lb 11.2 oz)    SpO2: 96% 95% 93%     Intake/Output Summary (Last 24 hours) at 09/25/15 1621 Last data filed at 09/25/15 1200  Gross per 24 hour  Intake      0 ml  Output   2150 ml  Net  -2150 ml    PULM  CTAB CV  RRR VASC  Catheter without bleeding or erythema Gen: lethargic, low grade fever present.  Laboratory CBC    Component Value Date/Time   WBC 5.2 09/25/2015 0938   HGB 9.9* 09/25/2015 0938   HCT 29.6* 09/25/2015 0938   PLT 84* 09/25/2015 0938    BMET    Component Value Date/Time   NA 139 09/25/2015 0938   K 4.6 09/25/2015 0938   CL 104 09/25/2015 0938   CO2 27 09/25/2015 0938   GLUCOSE 102* 09/25/2015 0938   BUN 38* 09/25/2015 0938   CREATININE 4.08* 09/25/2015 0938   CALCIUM 7.9* 09/25/2015 0938   GFRNONAA 15* 09/25/2015 0938   GFRAA 18* 09/25/2015 0938    Assessment/Planning: Renal failure, appears to be ESRD now    Permcath requested by medicine service, but with low grade fevers would not recommend placement until afebrile and cultures come back negative.  If Permcath is placed during active infection, risk of permcath infection and sepsis highly likely.    Will recheck and if blood cultures and negative for 48 hours and no fever for >24 hours, may consider permcath placement on Wednesday  BP now at goal with his HTN.  Improved with dialysis.  Will eventually need outpatient work up with vein mapping and placement of AVF/AVG as an  outpatient.    Tigerlily Christine  09/25/2015, 4:21 PM

## 2015-09-25 NOTE — Progress Notes (Signed)
Post HD TX Assessment

## 2015-09-25 NOTE — Care Management (Signed)
Patient admitted with hypertensive urgency due to medication non compliance.  Patient found to be in renal failure.  Will need outpatient dialysis.  Patient does not have health insurance.  Attempted assessment however patient was asleep.  Left application for [facility] and medication management.  Will follow up

## 2015-09-25 NOTE — Progress Notes (Signed)
ID FU Flu negative. If decompensates would start vanco and ceftraixone but hold abx unless clinically worsens as no obvious source.

## 2015-09-25 NOTE — Progress Notes (Signed)
HD TX Termination

## 2015-09-26 LAB — CBC
HCT: 28.9 % — ABNORMAL LOW (ref 40.0–52.0)
Hemoglobin: 9.4 g/dL — ABNORMAL LOW (ref 13.0–18.0)
MCH: 28.8 pg (ref 26.0–34.0)
MCHC: 32.4 g/dL (ref 32.0–36.0)
MCV: 88.9 fL (ref 80.0–100.0)
PLATELETS: 76 10*3/uL — AB (ref 150–440)
RBC: 3.26 MIL/uL — AB (ref 4.40–5.90)
RDW: 13.3 % (ref 11.5–14.5)
WBC: 3.1 10*3/uL — ABNORMAL LOW (ref 3.8–10.6)

## 2015-09-26 LAB — HEPARIN INDUCED PLATELET AB (HIT ANTIBODY): Heparin Induced Plt Ab: 0.459 OD — ABNORMAL HIGH (ref 0.000–0.400)

## 2015-09-26 MED ORDER — SENNOSIDES-DOCUSATE SODIUM 8.6-50 MG PO TABS
1.0000 | ORAL_TABLET | Freq: Two times a day (BID) | ORAL | Status: DC
  Administered 2015-09-26 – 2015-10-07 (×20): 1 via ORAL
  Filled 2015-09-26 (×20): qty 1

## 2015-09-26 MED ORDER — VANCOMYCIN HCL IN DEXTROSE 1-5 GM/200ML-% IV SOLN
1000.0000 mg | INTRAVENOUS | Status: DC
  Administered 2015-09-27: 1000 mg via INTRAVENOUS
  Filled 2015-09-26: qty 200

## 2015-09-26 MED ORDER — PROMETHAZINE HCL 25 MG/ML IJ SOLN
25.0000 mg | Freq: Once | INTRAMUSCULAR | Status: AC
  Administered 2015-09-26: 25 mg via INTRAVENOUS
  Filled 2015-09-26: qty 1

## 2015-09-26 MED ORDER — VANCOMYCIN HCL 10 G IV SOLR
2000.0000 mg | Freq: Once | INTRAVENOUS | Status: AC
  Administered 2015-09-26: 2000 mg via INTRAVENOUS
  Filled 2015-09-26: qty 2000

## 2015-09-26 NOTE — Care Management (Signed)
Follow up with patient to assess.  Patient is resting "states his eyes are tired and he doesn't want to open them".  Mother at bedside.  Mother states that patient lost his job in December.   Patient lives with her.  Currently is unemployed and does not have health insurance or PCP.  Patient lives in [facility] and does not qualify for [employer].  Mother states that someone "gave me a list with 3 doctors that I could do to".  She is going to bring the sheet for me to review tomorrow.  Ronnie Keith is working on outpatient HD.  Patient to apply for Medicaid.

## 2015-09-26 NOTE — Progress Notes (Signed)
[place] NAME: Ronnie Keith    MR#:  [id number]  DATE OF BIRTH:  Feb 25, 1961  SUBJECTIVE:  Continues to have low grade fever, started on vanco per ID REVIEW OF SYSTEMS:   Review of Systems  Constitutional: Negative for fever, chills and weight loss.  HENT: Negative for ear discharge, ear pain and nosebleeds.   Eyes: Negative for blurred vision, pain and discharge.  Respiratory: Negative for sputum production, shortness of breath, wheezing and stridor.   Cardiovascular: Positive for leg swelling. Negative for chest pain, palpitations, orthopnea and PND.  Gastrointestinal: Positive for nausea. Negative for vomiting, abdominal pain and diarrhea.  Genitourinary: Negative for urgency and frequency.  Musculoskeletal: Negative for back pain and joint pain.  Neurological: Negative for sensory change, speech change, focal weakness and weakness.  Psychiatric/Behavioral: Negative for depression and hallucinations. The patient is not nervous/anxious.    Tolerating Diet:yes Tolerating PT: not needed  DRUG ALLERGIES:  No Known Allergies  VITALS:  Blood pressure 114/62, pulse 84, temperature 98.2 F (36.8 C), temperature source Oral, resp. rate 17, height 6' (1.829 m), weight 117.8 kg (259 lb 11.2 oz), SpO2 94 %. PHYSICAL EXAMINATION:   Physical Exam  GENERAL:  55 y.o.-year-old patient lying in the bed with no acute distress.  EYES: Pupils equal, round, reactive to light and accommodation. No scleral icterus. Extraocular muscles intact.  HEENT: Head atraumatic, normocephalic. Oropharynx and nasopharynx clear.  NECK:  Supple, no jugular venous distention. No thyroid enlargement, no tenderness.  LUNGS: Normal breath sounds bilaterally, no wheezing, rales, rhonchi. No use of accessory muscles of respiration.  CARDIOVASCULAR: S1, S2 normal. No murmurs, rubs, or gallops.  ABDOMEN: Soft, nontender, nondistended. Bowel sounds present. No  organomegaly or mass.  EXTREMITIES: No cyanosis, clubbing , ++ edema b/l.   R antecub area pustule NEUROLOGIC: Cranial nerves II through XII are intact. No focal Motor or sensory deficits b/l.   PSYCHIATRIC: The patient is alert and oriented x 3.  SKIN: No obvious rash, lesion, or ulcer.   LABORATORY PANEL:  CBC  Recent Labs Lab 09/26/15 0947  WBC 3.1*  HGB 9.4*  HCT 28.9*  PLT 76*    Chemistries   Recent Labs Lab 09/25/15 1535  NA 138  K 4.1  CL 101  CO2 30  GLUCOSE 117*  BUN 24*  CREATININE 3.39*  CALCIUM 7.8*  AST 13*  ALT 16*  ALKPHOS 107  BILITOT 0.8    Cardiac Enzymes  Recent Labs Lab 09/20/15 1344  TROPONINI 0.04*   RADIOLOGY:  Dg Chest 2 View  09/25/2015  CLINICAL DATA:  Fever.  Hypertension and back pain. EXAM: CHEST  2 VIEW COMPARISON:  09/23/2015 FINDINGS: There is a right IJ catheter with tip in the projection of the right atrium. Heart size is moderately enlarged. No pleural effusion or edema. Asymmetric elevation of the right hemidiaphragm is identified. IMPRESSION: 1. No acute findings. 2. Cardiac enlargement and asymmetric elevation of right hemidiaphragm. Electronically Signed   By: Kerby Moors M.D.   On: 09/25/2015 16:46   ASSESSMENT AND PLAN:   55 year old Caucasian gentleman history of essential hypertension is presenting with elevated blood pressure.  1. R arm thrombophlebitis: antecub pustule seen at old IV site today, ID started vanco for now.  2. Hypertensive urgency: Continue Coreg, clonidine and hydralazine - blood pressures improved  3. End-stage renal disease now on hemodialysis -Tolerated first treatment -s/p Right IJ temp cath placement  09/23/15 by Dr  schnier -Patient will need permanent catheter this week - d/w OR nurse for Vascular for Dr Lucky Cowboy for perm cath placement.  4. Hyperkalemia: Received Kayexalate potassium improved -Resolved -off telemetry  * Venous thromboembolism prophylactic: Heparin  subcutaneous  Case discussed with Care Management/Social Worker. Management plans discussed with the patient, family and they are in agreement.  CODE STATUS: full  DVT Prophylaxis: heparin  TOTAL TIME TAKING CARE OF THIS PATIENT: 30 minutes.   >50% time spent on counselling and coordination of care - family at bedside  POSSIBLE D/C IN 2-3 DAYS, DEPENDING ON CLINICAL CONDITION.- once fever resolves, will need perm cath  Note: This dictation was prepared with Dragon dictation along with smaller phrase technology. Any transcriptional errors that result from this process are unintentional.  [facility], Cherylanne Ardelean M.D on 09/26/2015 at 10:01 PM  Between 7am to 6pm - Pager - [phone]  After 6pm go to [email or web address] - password [email or web address] [facility]  [place] [facility]  Office  [phone]  CC: Primary care physician; No primary care provider on file.

## 2015-09-26 NOTE — Progress Notes (Signed)
Initial Nutrition Assessment     INTERVENTION:  Meals and snacks: Pt may benefit from renal diet Nutrition diet education: Left renal diet information at bedside as pt sleeping this am during rounds.   NUTRITION DIAGNOSIS:   Inadequate oral intake related to acute illness as evidenced by  (nausea, vomiting this am).    GOAL:   Patient will meet greater than or equal to 90% of their needs    MONITOR:    (Energy intake, Digestive system)  REASON FOR ASSESSMENT:   LOS    ASSESSMENT:      Pt admitted with uncontrolled HTN and worsening renal function. Has received 3 HD session since admission  Past Medical History  Diagnosis Date  . Hypertension   . Chronic low back pain   . Scoliosis   . Anxiety   . Sciatica     Current Nutrition: nothing eaten this am per RN, Raquel Sarna secondary to nausea, vomiting.  Pt sleeping and did not disturb secondary to events of this am  Food/Nutrition-Related History: unsure intake prior to admission.  Per I and O eating up and down (0% -100%)   Scheduled Medications:  . aspirin EC  81 mg Oral Daily  . carvedilol  12.5 mg Oral BID WC  . cloNIDine  0.1 mg Oral BID  . hydrALAZINE  25 mg Oral 3 times per day     Electrolyte/Renal Profile and Glucose Profile:   Recent Labs Lab 09/23/15 2051 09/24/15 1929 09/25/15 0938 09/25/15 1535  NA 136 137 139 138  K 5.3* 4.7 4.6 4.1  CL 114* 110 104 101  CO2 19* 22 27 30   BUN 70* 56* 38* 24*  CREATININE 5.15* 4.91* 4.08* 3.39*  CALCIUM 7.2* 7.6* 7.9* 7.8*  PHOS 5.2* 4.2 4.0  --   GLUCOSE 116* 140* 102* 117*   Protein Profile:   Recent Labs Lab 09/24/15 1929 09/25/15 0938 09/25/15 1535  ALBUMIN 3.4* 3.2* 3.1*    Gastrointestinal Profile: Last BM:1/20   Nutrition-Focused Physical Exam Findings:  Unable to complete Nutrition-Focused physical exam at this time.     Weight Change: wt encounters reviewed    Diet Order:  Diet Heart Room service appropriate?: Yes; Fluid  consistency:: Thin  Skin:   reviewed   Height:   Ht Readings from Last 1 Encounters:  09/19/15 6' (1.829 m)    Weight:   Wt Readings from Last 1 Encounters:  09/25/15 259 lb 11.2 oz (117.8 kg)    Ideal Body Weight:     BMI:  Body mass index is 35.21 kg/(m^2).   EDUCATION NEEDS:   Education needs no appropriate at this time  LOW Care Level  Art Levan B. Zenia Resides, [place], [place] (pager) Weekend/On-Call pager [phone])

## 2015-09-26 NOTE — Progress Notes (Signed)
ANTIBIOTIC CONSULT NOTE - INITIAL  Pharmacy Consult for Vancomycin Indication: right arm septic thrombophlebitis  No Known Allergies  Patient Measurements: Height: 6' (182.9 cm) Weight: 259 lb 11.2 oz (117.8 kg) IBW/kg (Calculated) : 77.6 Adjusted Body Weight: 91 kg  Vital Signs: Temp: 99.2 F (37.3 C) (01/24 1220) Temp Source: Oral (01/24 1220) BP: 126/68 mmHg (01/24 1220) Pulse Rate: 97 (01/24 1220) Intake/Output from previous day: 01/23 0701 - 01/24 0700 In: 0  Out: 800 [Urine:300] Intake/Output from this shift: Total I/O In: 0  Out: 200 [Urine:200]  Labs:  Recent Labs  09/24/15 1929 09/25/15 0938 09/25/15 1535 09/26/15 0947  WBC 5.1 5.2  --  3.1*  HGB 9.8* 9.9*  --  9.4*  PLT 86* 84*  --  76*  CREATININE 4.91* 4.08* 3.39*  --    Estimated Creatinine Clearance: 33.8 mL/min (by C-G formula based on Cr of 3.39). No results for input(s): VANCOTROUGH, VANCOPEAK, VANCORANDOM, GENTTROUGH, GENTPEAK, GENTRANDOM, TOBRATROUGH, TOBRAPEAK, TOBRARND, AMIKACINPEAK, AMIKACINTROU, AMIKACIN in the last 72 hours.   Microbiology: Recent Results (from the past 720 hour(s))  CULTURE, BLOOD (ROUTINE X 2) w Reflex to PCR ID Panel     Status: None (Preliminary result)   Collection Time: 09/25/15 11:08 AM  Result Value Ref Range Status   Specimen Description BLOOD LEFT HAND  Final   Special Requests BOTTLES DRAWN AEROBIC AND ANAEROBIC [place]  Final   Culture NO GROWTH 1 DAY  Final   Report Status PENDING  Incomplete  CULTURE, BLOOD (ROUTINE X 2) w Reflex to PCR ID Panel     Status: None (Preliminary result)   Collection Time: 09/25/15  3:41 PM  Result Value Ref Range Status   Specimen Description BLOOD LINE  Final   Special Requests   Final    BOTTLES DRAWN AEROBIC AND ANAEROBIC AERO 8CC ANA 14CC   Culture NO GROWTH < 24 HOURS  Final   Report Status PENDING  Incomplete    Medical History: Past Medical History  Diagnosis Date  . Hypertension   . Chronic low back  pain   . Scoliosis   . Anxiety   . Sciatica     Medications:  Scheduled:  . aspirin EC  81 mg Oral Daily  . carvedilol  12.5 mg Oral BID WC  . cloNIDine  0.1 mg Oral BID  . hydrALAZINE  25 mg Oral 3 times per day  . vancomycin  2,000 mg Intravenous Once  . [START ON 09/27/2015] vancomycin  1,000 mg Intravenous Q M,W,F-HD   Infusions:   Assessment: Pharmacy consulted to dose Vancomycin in a 55 yo male for empiric treatment of right arm septic thrombophlebitis.  Patient with ESRD requiring HD.  Patient's last HD session was on 1/23.  Per nephrology notes, next HD session tentatively scheduled for Wednesday (1/25).    Goal of Therapy:  Vancomycin trough 15-25 mcg/mL  Plan:  Will order Vancomycin 2 gm IV once as a loading dose.  Will schedule Vancomycin 1 gm IV on MWF with HD.  Will need to follow closely for possible change in HD schedule.  Will tentatively schedule trough level for 1/30 (Monday).    Pharmacy will continue to follow.   Keziah Avis G 09/26/2015,3:14 PM

## 2015-09-26 NOTE — Progress Notes (Signed)
[place] INFECTIOUS DISEASE PROGRESS NOTE Date of Admission:  09/19/2015     ID: Ronnie Keith is a 55 y.o. male with fever, new onset ESRD on HD  Active Problems:   Hypertensive urgency   Acute kidney injury ([place])   Hyperkalemia   Subjective: Feels "bad", not sleeping, some nausea but no vomiting or diarrhea. Low grade fevers persist  ROS  Eleven systems are reviewed and negative except per hpi  Medications:  Antibiotics Given (last 72 hours)    None     . aspirin EC  81 mg Oral Daily  . carvedilol  12.5 mg Oral BID WC  . cloNIDine  0.1 mg Oral BID  . hydrALAZINE  25 mg Oral 3 times per day    Objective: Vital signs in last 24 hours: Temp:  [98.4 F (36.9 C)-100.3 F (37.9 C)] 99.2 F (37.3 C) (01/24 1220) Pulse Rate:  [90-105] 97 (01/24 1220) Resp:  [16-20] 16 (01/24 1220) BP: (95-137)/(44-72) 126/68 mmHg (01/24 1220) SpO2:  [91 %-93 %] 93 % (01/24 1220) Constitutional: He is alert, some flat affect, minimal interaction. . He appears obese, stittin gup on side of bedHENT: PERRLA,  Mouth/Throat: Oropharynx is clear and dry . No oropharyngeal exudate.  Cardiovascular: Normal rate, regular rhythm and normal heart sounds.  R neck IJ line in placed Pulmonary/Chest: Effort normal and breath sounds normal. No respiratory distress. He has no wheezes.  Abdominal: obese, distended, mild ttp diffusely, esp RUQ . Bowel sounds are normal. Lymphadenopathy: He has no cervical adenopathy.  Neurological: He is alert and oriented to person, place, and time.  Skin: Skin is warm and dry. R antecub has a pustule with surrounding redness and induration .  Psychiatric: He has a flat affect  Lab Results  Recent Labs  09/25/15 0938 09/25/15 1535 09/26/15 0947  WBC 5.2  --  3.1*  HGB 9.9*  --  9.4*  HCT 29.6*  --  28.9*  NA 139 138  --   K 4.6 4.1  --   CL 104 101  --   CO2 27 30  --   BUN 38* 24*  --   CREATININE 4.08* 3.39*  --     Microbiology: Results for orders  placed or performed during the hospital encounter of 09/19/15  CULTURE, BLOOD (ROUTINE X 2) w Reflex to PCR ID Panel     Status: None (Preliminary result)   Collection Time: 09/25/15 11:08 AM  Result Value Ref Range Status   Specimen Description BLOOD LEFT HAND  Final   Special Requests BOTTLES DRAWN AEROBIC AND ANAEROBIC [place]  Final   Culture NO GROWTH 1 DAY  Final   Report Status PENDING  Incomplete  CULTURE, BLOOD (ROUTINE X 2) w Reflex to PCR ID Panel     Status: None (Preliminary result)   Collection Time: 09/25/15  3:41 PM  Result Value Ref Range Status   Specimen Description BLOOD LINE  Final   Special Requests   Final    BOTTLES DRAWN AEROBIC AND ANAEROBIC AERO 8CC ANA 14CC   Culture NO GROWTH < 24 HOURS  Final   Report Status PENDING  Incomplete   Studies/Results: Dg Chest 2 View  09/25/2015  CLINICAL DATA:  Fever.  Hypertension and back pain. EXAM: CHEST  2 VIEW COMPARISON:  09/23/2015 FINDINGS: There is a right IJ catheter with tip in the projection of the right atrium. Heart size is moderately enlarged. No pleural effusion or edema. Asymmetric elevation of the right  hemidiaphragm is identified. IMPRESSION: 1. No acute findings. 2. Cardiac enlargement and asymmetric elevation of right hemidiaphragm. Electronically Signed   By: Kerby Moors M.D.   On: 09/25/2015 16:46    Assessment/Plan: Ronnie Keith is a 55 y.o. male with Htn, CKD now on HD with fevers on day 6 of admission. [place]. WBC nml. CXR neg. UA on admit neg for infection, does not currently have a foley.  HIV and Flu negative Today noticed R antecub pustule at old IV site with induration. I have tried to unroof lesion and cultured it.  Will start vanco for probable R arm thrombophlebitis and apply warm compress.  I note his LFTs are nml and had neg CT recently of abd so I suspect his Abd pain is not related to his fever  Thank you very much for the consult. Will follow with you.  [place],  [place]   09/26/2015, 2:57 PM

## 2015-09-26 NOTE — Progress Notes (Signed)
[facility]  ROUNDING NOTE   Subjective:   Third hemodialysis treatment yesterday. Tolerated treatment well.   Tmax 100.4 - blood cultures currently with no growth  Objective:  Vital signs in last 24 hours:  Temp:  [98.4 F (36.9 C)-100.4 F (38 C)] 99.7 F (37.6 C) (01/24 0511) Pulse Rate:  [90-112] 102 (01/24 0511) Resp:  [16-20] 20 (01/24 0511) BP: (95-160)/(44-82) 131/68 mmHg (01/24 0511) SpO2:  [91 %-96 %] 91 % (01/24 0511) Weight:  [117.8 kg (259 lb 11.2 oz)] 117.8 kg (259 lb 11.2 oz) (01/23 1200)  Weight change: 0.1 kg (3.5 oz) Filed Weights   09/24/15 1915 09/25/15 0915 09/25/15 1200  Weight: 119.9 kg (264 lb 5.3 oz) 120 kg (264 lb 8.8 oz) 117.8 kg (259 lb 11.2 oz)    Intake/Output: I/O last 3 completed shifts: In: 0  Out: 2450 [Urine:950; Other:1500]   Intake/Output this shift:     Physical Exam: General: NAD, laying in bed.   Head: Normocephalic, atraumatic. Moist oral mucosal membranes  Eyes: Anicteric, PERRL  Neck: Supple, trachea midline  Lungs:  Clear to auscultation normal effort  Heart: Regular rate and rhythm  Abdomen:  Soft, nontender, BS present  Extremities: no peripheral edema.  Neurologic: Nonfocal, moving all four extremities  Skin: No lesions  Access: RIJ temp HD catheter 09/23/2015 Dr. Delana Meyer    Basic Metabolic Panel:  Recent Labs Lab 09/21/15 0904 09/22/15 0548 09/23/15 2051 09/24/15 1929 09/25/15 0938 09/25/15 1535  NA 139 137 136 137 139 138  K 6.3* 5.3* 5.3* 4.7 4.6 4.1  CL 114* 111 114* 110 104 101  CO2 19* 19* 19* 22 27 30   GLUCOSE 104* 88 116* 140* 102* 117*  BUN 68* 61* 70* 56* 38* 24*  CREATININE 4.92* 5.01* 5.15* 4.91* 4.08* 3.39*  CALCIUM 7.5* 7.2* 7.2* 7.6* 7.9* 7.8*  PHOS 4.1 5.3* 5.2* 4.2 4.0  --     Liver Function Tests:  Recent Labs Lab 09/22/15 0548 09/23/15 2051 09/24/15 1929 09/25/15 0938 09/25/15 1535  AST  --   --   --   --  13*  ALT  --   --   --   --  16*  ALKPHOS  --   --    --   --  107  BILITOT  --   --   --   --  0.8  PROT  --   --   --   --  5.9*  ALBUMIN 3.1* 3.1* 3.4* 3.2* 3.1*   No results for input(s): LIPASE, AMYLASE in the last 168 hours. No results for input(s): AMMONIA in the last 168 hours.  CBC:  Recent Labs Lab 09/19/15 1801 09/20/15 0532 09/22/15 0548 09/23/15 0600 09/24/15 1929 09/25/15 0938  WBC 6.9 6.0 5.4 5.4 5.1 5.2  NEUTROABS 5.1  --   --   --   --   --   HGB 12.4* 10.9* 9.8* 9.5* 9.8* 9.9*  HCT 37.4* 32.4* 30.2* 29.3* 29.9* 29.6*  MCV 90.3 90.0 89.1 89.9 90.0 89.1  PLT 144* 123* 106* 95* 86* 84*    Cardiac Enzymes:  Recent Labs Lab 09/19/15 1801 09/19/15 2345 09/20/15 0532 09/20/15 1132 09/20/15 1344  TROPONINI 0.04* 0.06* 0.04* 0.05* 0.04*    BNP: Invalid input(s): POCBNP  CBG: No results for input(s): GLUCAP in the last 168 hours.  Microbiology: Results for orders placed or performed during the hospital encounter of 09/19/15  CULTURE, BLOOD (ROUTINE X 2) w Reflex to PCR ID Panel  Status: None (Preliminary result)   Collection Time: 09/25/15 11:08 AM  Result Value Ref Range Status   Specimen Description BLOOD LEFT HAND  Final   Special Requests BOTTLES DRAWN AEROBIC AND ANAEROBIC [place]  Final   Culture NO GROWTH < 12 HOURS  Final   Report Status PENDING  Incomplete    Coagulation Studies: No results for input(s): LABPROT, INR in the last 72 hours.  Urinalysis:  Recent Labs  09/25/15 1920  COLORURINE YELLOW*  LABSPEC 1.010  PHURINE 7.0  GLUCOSEU 50*  HGBUR NEGATIVE  BILIRUBINUR NEGATIVE  KETONESUR TRACE*  PROTEINUR >500*  NITRITE NEGATIVE  LEUKOCYTESUR NEGATIVE      Imaging: Dg Chest 2 View  09/25/2015  CLINICAL DATA:  Fever.  Hypertension and back pain. EXAM: CHEST  2 VIEW COMPARISON:  09/23/2015 FINDINGS: There is a right IJ catheter with tip in the projection of the right atrium. Heart size is moderately enlarged. No pleural effusion or edema. Asymmetric elevation of the  right hemidiaphragm is identified. IMPRESSION: 1. No acute findings. 2. Cardiac enlargement and asymmetric elevation of right hemidiaphragm. Electronically Signed   By: Kerby Moors M.D.   On: 09/25/2015 16:46     Medications:     . aspirin EC  81 mg Oral Daily  . carvedilol  12.5 mg Oral BID WC  . cloNIDine  0.1 mg Oral BID  . hydrALAZINE  25 mg Oral 3 times per day   sodium chloride, sodium chloride, acetaminophen **OR** acetaminophen, alteplase, heparin, hydrALAZINE, lidocaine (PF), lidocaine-prilocaine, morphine injection, ondansetron **OR** ondansetron (ZOFRAN) IV, oxyCODONE, pentafluoroprop-tetrafluoroeth, sodium chloride  Assessment/ Plan:  55 y.o.white male  with hypertension, chronic low back pain, scoliosis, anxiety, CKD stage IV Baseline Cr 3.4 who was admitted to [facility] on 09/19/2015 for evaluation of uncontrolled hypertension.   1. End Stage Renal Disease: completed three treatments of hemodialysis. Baseline creatinine 3.4 on 12/28/14.  Etiology of ESRD unclear but most likely secondary to uncontrolled HTN and subsequent focal glomeruloscerlosis with significant proteinuria. Serologic work up negative.  Would not be beneficial to biopsy kidneys at this point as we would find scar most likley. Renal US suggested hydronephrosis however on CT scan abd/pelvis this was negative. -Patient had first hemodialysis treatment 09/23/15. - Will need tunneled permcath placement before discharge. However currently with fevers and infection.  - PPD pending.  - Next treatment tentatively for Wednesday.   2. Anemia chronic kidney disease. Hgb 9.9 - will consider epogen/mircera as outpt. EPO for next treatment.   3. Secondary hyperparathyroidism. PTH 411, phos 4.   - not currently on a binder or vitamin D agent.   4. Hypertension.  Blood pressure at goal. -  continue Coreg, clonidine, and hydralazine   LOS: [place], Zykee Avakian 1/24/201711:14 AM

## 2015-09-27 LAB — RENAL FUNCTION PANEL
ALBUMIN: 2.9 g/dL — AB (ref 3.5–5.0)
ANION GAP: 11 (ref 5–15)
BUN: 41 mg/dL — AB (ref 6–20)
CHLORIDE: 101 mmol/L (ref 101–111)
CO2: 24 mmol/L (ref 22–32)
Calcium: 7.9 mg/dL — ABNORMAL LOW (ref 8.9–10.3)
Creatinine, Ser: 5.38 mg/dL — ABNORMAL HIGH (ref 0.61–1.24)
GFR calc Af Amer: 13 mL/min — ABNORMAL LOW (ref >60)
GFR, EST NON AFRICAN AMERICAN: 11 mL/min — AB (ref >60)
Glucose, Bld: 111 mg/dL — ABNORMAL HIGH (ref 65–99)
PHOSPHORUS: 3.1 mg/dL (ref 2.5–4.6)
POTASSIUM: 4.2 mmol/L (ref 3.5–5.1)
Sodium: 136 mmol/L (ref 135–145)

## 2015-09-27 LAB — CBC
HEMATOCRIT: 30.3 % — AB (ref 40.0–52.0)
HEMOGLOBIN: 10 g/dL — AB (ref 13.0–18.0)
MCH: 29.7 pg (ref 26.0–34.0)
MCHC: 32.9 g/dL (ref 32.0–36.0)
MCV: 90.3 fL (ref 80.0–100.0)
Platelets: 79 10*3/uL — ABNORMAL LOW (ref 150–440)
RBC: 3.36 MIL/uL — AB (ref 4.40–5.90)
RDW: 13.3 % (ref 11.5–14.5)
WBC: 5 10*3/uL (ref 3.8–10.6)

## 2015-09-27 MED ORDER — CHLORHEXIDINE GLUCONATE CLOTH 2 % EX PADS
6.0000 | MEDICATED_PAD | Freq: Once | CUTANEOUS | Status: DC
Start: 2015-09-27 — End: 2015-09-29

## 2015-09-27 MED ORDER — EPOETIN ALFA 10000 UNIT/ML IJ SOLN
10000.0000 [IU] | Freq: Once | INTRAMUSCULAR | Status: AC
Start: 2015-09-27 — End: 2015-09-27
  Administered 2015-09-27: 10000 [IU] via INTRAVENOUS

## 2015-09-27 MED ORDER — SODIUM CHLORIDE 0.9 % IV SOLN: INTRAVENOUS | Status: DC

## 2015-09-27 NOTE — Progress Notes (Signed)
Hemodialysis start 

## 2015-09-27 NOTE — Progress Notes (Signed)
[facility]  ROUNDING NOTE   Subjective:   Seen and examined on hemodialysis. Tolerating treatment. UF 2 litres.  Complains of bilateral leg pain related to peripheral edema.   Afebrile. Blood cultures are negative.   Objective:  Vital signs in last 24 hours:  Temp:  [98.2 F (36.8 C)-99.2 F (37.3 C)] 98.8 F (37.1 C) (01/25 1000) Pulse Rate:  [84-97] 92 (01/25 1006) Resp:  [14-23] 23 (01/25 1006) BP: (114-144)/(62-87) 133/84 mmHg (01/25 1006) SpO2:  [93 %-95 %] 94 % (01/25 1006) Weight:  [118.9 kg (262 lb 2 oz)] 118.9 kg (262 lb 2 oz) (01/25 1000)  Weight change:  Filed Weights   09/25/15 0915 09/25/15 1200 09/27/15 1000  Weight: 120 kg (264 lb 8.8 oz) 117.8 kg (259 lb 11.2 oz) 118.9 kg (262 lb 2 oz)    Intake/Output: I/O last 3 completed shifts: In: 720 [P.O.:720] Out: 1375 [id number]   Intake/Output this shift:  Total I/O In: -  Out: 200 [Urine:200]  Physical Exam: General: NAD, laying in bed.   Head: Normocephalic, atraumatic. Moist oral mucosal membranes  Eyes: Anicteric, PERRL  Neck: Supple, trachea midline  Lungs:  Clear to auscultation normal effort  Heart: Regular rate and rhythm  Abdomen:  Soft, nontender, BS present  Extremities: 1+ peripheral edema.  Neurologic: Nonfocal, moving all four extremities  Skin: No lesions  Access: RIJ temp HD catheter 09/23/2015 Dr. Delana Meyer    Basic Metabolic Panel:  Recent Labs Lab 09/21/15 0904 09/22/15 0548 09/23/15 2051 09/24/15 1929 09/25/15 0938 09/25/15 1535  NA 139 137 136 137 139 138  K 6.3* 5.3* 5.3* 4.7 4.6 4.1  CL 114* 111 114* 110 104 101  CO2 19* 19* 19* 22 27 30   GLUCOSE 104* 88 116* 140* 102* 117*  BUN 68* 61* 70* 56* 38* 24*  CREATININE 4.92* 5.01* 5.15* 4.91* 4.08* 3.39*  CALCIUM 7.5* 7.2* 7.2* 7.6* 7.9* 7.8*  PHOS 4.1 5.3* 5.2* 4.2 4.0  --     Liver Function Tests:  Recent Labs Lab 09/22/15 0548 09/23/15 2051 09/24/15 1929 09/25/15 0938 09/25/15 1535  AST  --    --   --   --  13*  ALT  --   --   --   --  16*  ALKPHOS  --   --   --   --  107  BILITOT  --   --   --   --  0.8  PROT  --   --   --   --  5.9*  ALBUMIN 3.1* 3.1* 3.4* 3.2* 3.1*   No results for input(s): LIPASE, AMYLASE in the last 168 hours. No results for input(s): AMMONIA in the last 168 hours.  CBC:  Recent Labs Lab 09/22/15 0548 09/23/15 0600 09/24/15 1929 09/25/15 0938 09/26/15 0947  WBC 5.4 5.4 5.1 5.2 3.1*  HGB 9.8* 9.5* 9.8* 9.9* 9.4*  HCT 30.2* 29.3* 29.9* 29.6* 28.9*  MCV 89.1 89.9 90.0 89.1 88.9  PLT 106* 95* 86* 84* 76*    Cardiac Enzymes:  Recent Labs Lab 09/20/15 1132 09/20/15 1344  TROPONINI 0.05* 0.04*    BNP: Invalid input(s): POCBNP  CBG: No results for input(s): GLUCAP in the last 168 hours.  Microbiology: Results for orders placed or performed during the hospital encounter of 09/19/15  CULTURE, BLOOD (ROUTINE X 2) w Reflex to PCR ID Panel     Status: None (Preliminary result)   Collection Time: 09/25/15 11:08 AM  Result Value Ref Range Status  Specimen Description BLOOD LEFT HAND  Final   Special Requests BOTTLES DRAWN AEROBIC AND ANAEROBIC [place]  Final   Culture NO GROWTH 1 DAY  Final   Report Status PENDING  Incomplete  CULTURE, BLOOD (ROUTINE X 2) w Reflex to PCR ID Panel     Status: None (Preliminary result)   Collection Time: 09/25/15  3:41 PM  Result Value Ref Range Status   Specimen Description BLOOD LINE  Final   Special Requests   Final    BOTTLES DRAWN AEROBIC AND ANAEROBIC AERO 8CC ANA 14CC   Culture NO GROWTH < 24 HOURS  Final   Report Status PENDING  Incomplete  Wound culture     Status: None (Preliminary result)   Collection Time: 09/26/15  3:02 PM  Result Value Ref Range Status   Specimen Description WOUND  Final   Special Requests Normal  Final   Gram Stain PENDING  Incomplete   Culture   Final    STAPHYLOCOCCUS AUREUS SUSCEPTIBILITIES TO FOLLOW    Report Status PENDING  Incomplete    Coagulation  Studies: No results for input(s): LABPROT, INR in the last 72 hours.  Urinalysis:  Recent Labs  09/25/15 1920  COLORURINE YELLOW*  LABSPEC 1.010  PHURINE 7.0  GLUCOSEU 50*  HGBUR NEGATIVE  BILIRUBINUR NEGATIVE  KETONESUR TRACE*  PROTEINUR >500*  NITRITE NEGATIVE  LEUKOCYTESUR NEGATIVE      Imaging: Dg Chest 2 View  09/25/2015  CLINICAL DATA:  Fever.  Hypertension and back pain. EXAM: CHEST  2 VIEW COMPARISON:  09/23/2015 FINDINGS: There is a right IJ catheter with tip in the projection of the right atrium. Heart size is moderately enlarged. No pleural effusion or edema. Asymmetric elevation of the right hemidiaphragm is identified. IMPRESSION: 1. No acute findings. 2. Cardiac enlargement and asymmetric elevation of right hemidiaphragm. Electronically Signed   By: Kerby Moors M.D.   On: 09/25/2015 16:46     Medications:     . aspirin EC  81 mg Oral Daily  . carvedilol  12.5 mg Oral BID WC  . cloNIDine  0.1 mg Oral BID  . epoetin (EPOGEN/PROCRIT) injection  10,000 Units Intravenous Once  . hydrALAZINE  25 mg Oral 3 times per day  . senna-docusate  1 tablet Oral BID  . vancomycin  1,000 mg Intravenous Q M,W,F-HD   acetaminophen **OR** acetaminophen, alteplase, hydrALAZINE, lidocaine (PF), lidocaine-prilocaine, morphine injection, ondansetron **OR** ondansetron (ZOFRAN) IV, oxyCODONE, pentafluoroprop-tetrafluoroeth, sodium chloride  Assessment/ Plan:  55 y.o.white male  with hypertension, chronic low back pain, scoliosis, anxiety, CKD stage IV Baseline Cr 3.4 who was admitted to [facility] on 09/19/2015 for evaluation of uncontrolled hypertension.   1. End Stage Renal Disease: completed three treatments of hemodialysis. Now of fourth treatment. Baseline creatinine 3.4 on 12/28/14.  Etiology of ESRD unclear but most likely secondary to uncontrolled HTN and subsequent focal glomeruloscerlosis with significant proteinuria. Serologic work up negative.   -Patient had first  hemodialysis treatment 09/23/15. - Will need tunneled permcath placement before discharge, tentatively for Friday.  - Next treatment tentatively for Friday  2. Anemia chronic kidney disease. Hgb 9.4 - epo 10000 units ordered   3. Secondary hyperparathyroidism. PTH 411, phos 4.   - not currently on a binder or vitamin D agent.   4. Hypertension.  Blood pressure at goal. -  continue Coreg, clonidine, and hydralazine   LOS: 7 Erikah Thumm 1/25/201711:00 AM

## 2015-09-27 NOTE — Progress Notes (Signed)
Hemodialysis completed. 

## 2015-09-27 NOTE — Plan of Care (Signed)
Problem: Nutrition: Goal: Adequate nutrition will be maintained Outcome: Not Progressing Pt is not eating very much with meals

## 2015-09-27 NOTE — Progress Notes (Signed)
Per MD pt may have clears for breakfast until 9am. MD is hopeful to do procedure tomorrow afternoon.

## 2015-09-27 NOTE — Progress Notes (Signed)
Post hd tx 

## 2015-09-27 NOTE — Progress Notes (Signed)
ANTIBIOTIC CONSULT NOTE - Follow Up  Pharmacy Consult for Vancomycin Indication: right arm septic thrombophlebitis  No Known Allergies  Patient Measurements: Height: 6' (182.9 cm) Weight: 262 lb 2 oz (118.9 kg) IBW/kg (Calculated) : 77.6 Adjusted Body Weight: 91 kg  Vital Signs: Temp: 98.8 F (37.1 C) (01/25 1000) Temp Source: Oral (01/25 1000) BP: 127/80 mmHg (01/25 1230) Pulse Rate: 79 (01/25 1230) Intake/Output from previous day: 01/24 0701 - 01/25 0700 In: 720 [P.O.:720] Out: 1075 [Urine:1075] Intake/Output from this shift: Total I/O In: -  Out: 200 [Urine:200]  Labs:  Recent Labs  09/25/15 0938 09/25/15 1535 09/26/15 0947 09/27/15 1100  WBC 5.2  --  3.1* 5.0  HGB 9.9*  --  9.4* 10.0*  PLT 84*  --  76* 79*  CREATININE 4.08* 3.39*  --  5.38*   Estimated Creatinine Clearance: 21.4 mL/min (by C-G formula based on Cr of 5.38). No results for input(s): VANCOTROUGH, VANCOPEAK, VANCORANDOM, GENTTROUGH, GENTPEAK, GENTRANDOM, TOBRATROUGH, TOBRAPEAK, TOBRARND, AMIKACINPEAK, AMIKACINTROU, AMIKACIN in the last 72 hours.   Microbiology: Recent Results (from the past 720 hour(s))  CULTURE, BLOOD (ROUTINE X 2) w Reflex to PCR ID Panel     Status: None (Preliminary result)   Collection Time: 09/25/15 11:08 AM  Result Value Ref Range Status   Specimen Description BLOOD LEFT HAND  Final   Special Requests BOTTLES DRAWN AEROBIC AND ANAEROBIC [place]  Final   Culture NO GROWTH 2 DAYS  Final   Report Status PENDING  Incomplete  CULTURE, BLOOD (ROUTINE X 2) w Reflex to PCR ID Panel     Status: None (Preliminary result)   Collection Time: 09/25/15  3:41 PM  Result Value Ref Range Status   Specimen Description BLOOD LINE  Final   Special Requests   Final    BOTTLES DRAWN AEROBIC AND ANAEROBIC AERO 8CC ANA 14CC   Culture NO GROWTH 2 DAYS  Final   Report Status PENDING  Incomplete  Wound culture     Status: None (Preliminary result)   Collection Time: 09/26/15  3:02 PM   Result Value Ref Range Status   Specimen Description WOUND  Final   Special Requests Normal  Final   Gram Stain PENDING  Incomplete   Culture   Final    STAPHYLOCOCCUS AUREUS SUSCEPTIBILITIES TO FOLLOW    Report Status PENDING  Incomplete    Medical History: Past Medical History  Diagnosis Date  . Hypertension   . Chronic low back pain   . Scoliosis   . Anxiety   . Sciatica     Medications:  Scheduled:  . aspirin EC  81 mg Oral Daily  . carvedilol  12.5 mg Oral BID WC  . cloNIDine  0.1 mg Oral BID  . hydrALAZINE  25 mg Oral 3 times per day  . senna-docusate  1 tablet Oral BID  . vancomycin  1,000 mg Intravenous Q M,W,F-HD   Infusions:   Assessment: Pharmacy consulted to dose Vancomycin in a 55 yo male for empiric treatment of right arm septic thrombophlebitis.  Patient with ESRD requiring HD.  Patient's last HD session was today (1/25).  Patient received loading dose of 2 gm IV once on 1/24 and received Vancomycin 1 gm IV once during last 60 minutes of HD session for today.     Goal of Therapy:  Vancomycin trough 15-25 mcg/mL  Plan:  Continue Vancomycin 1 gm IV on MWF with HD.  Will need to follow closely for possible change in HD schedule.  Will tentatively schedule trough level for 1/30 (Monday).    Pharmacy will continue to follow.   Mary Hockey G 09/27/2015,12:45 PM

## 2015-09-27 NOTE — Progress Notes (Signed)
Pt states stomach hurts, but just wants to sleep and not have any pain medicine.

## 2015-09-27 NOTE — Progress Notes (Signed)
[place] INFECTIOUS DISEASE PROGRESS NOTE Date of Admission:  09/19/2015     ID: Ronnie Keith is a 55 y.o. male with fever, new onset ESRD on HD  Active Problems:   Hypertensive urgency   Acute kidney injury ([place])   Hyperkalemia   Subjective: No further fevers.  ROS  Eleven systems are reviewed and negative except per hpi  Medications:  Antibiotics Given (last 72 hours)    Date/Time Action Medication Dose Rate   09/26/15 1654 Given  [just received from pharmacy]   vancomycin (VANCOCIN) 2,000 mg in sodium chloride 0.9 % 500 mL IVPB 2,000 mg 250 mL/hr   09/27/15 1240 Given  [Given last hour of hemodialysis treatment.]   vancomycin (VANCOCIN) IVPB 1000 mg/200 mL premix 1,000 mg 200 mL/hr     . aspirin EC  81 mg Oral Daily  . carvedilol  12.5 mg Oral BID WC  . cloNIDine  0.1 mg Oral BID  . hydrALAZINE  25 mg Oral 3 times per day  . senna-docusate  1 tablet Oral BID  . vancomycin  1,000 mg Intravenous Q M,W,F-HD    Objective: Vital signs in last 24 hours: Temp:  [98.2 F (36.8 C)-98.9 F (37.2 C)] 98.2 F (36.8 C) (01/25 1355) Pulse Rate:  [79-92] 88 (01/25 1355) Resp:  [10-23] 12 (01/25 1355) BP: (114-144)/(62-90) 122/67 mmHg (01/25 1355) SpO2:  [93 %-98 %] 94 % (01/25 1355) Weight:  [117.4 kg (258 lb 13.1 oz)-118.9 kg (262 lb 2 oz)] 117.4 kg (258 lb 13.1 oz) (01/25 1312) Constitutional: He is alert, some flat affect, minimal interaction. . He appears obese, stittin gup on side of bedHENT: PERRLA,  Mouth/Throat: Oropharynx is clear and dry . No oropharyngeal exudate.  Cardiovascular: Normal rate, regular rhythm and normal heart sounds.  R neck IJ line in placed Pulmonary/Chest: Effort normal and breath sounds normal. No respiratory distress. He has no wheezes.  Abdominal: obese, distended, mild ttp diffusely, esp RUQ . Bowel sounds are normal. Lymphadenopathy: He has no cervical adenopathy.  Neurological: He is alert and oriented to person, place, and time.   Skin: Skin is warm and dry. R antecub has a pustule with surrounding redness and induration .  Psychiatric: He has a flat affect  Lab Results  Recent Labs  09/25/15 1535 09/26/15 0947 09/27/15 1100  WBC  --  3.1* 5.0  HGB  --  9.4* 10.0*  HCT  --  28.9* 30.3*  NA 138  --  136  K 4.1  --  4.2  CL 101  --  101  CO2 30  --  24  BUN 24*  --  41*  CREATININE 3.39*  --  5.38*    Microbiology: Results for orders placed or performed during the hospital encounter of 09/19/15  CULTURE, BLOOD (ROUTINE X 2) w Reflex to PCR ID Panel     Status: None (Preliminary result)   Collection Time: 09/25/15 11:08 AM  Result Value Ref Range Status   Specimen Description BLOOD LEFT HAND  Final   Special Requests BOTTLES DRAWN AEROBIC AND ANAEROBIC [place]  Final   Culture NO GROWTH 2 DAYS  Final   Report Status PENDING  Incomplete  CULTURE, BLOOD (ROUTINE X 2) w Reflex to PCR ID Panel     Status: None (Preliminary result)   Collection Time: 09/25/15  3:41 PM  Result Value Ref Range Status   Specimen Description BLOOD LINE  Final   Special Requests   Final    BOTTLES  DRAWN AEROBIC AND ANAEROBIC AERO 8CC ANA 14CC   Culture NO GROWTH 2 DAYS  Final   Report Status PENDING  Incomplete  Wound culture     Status: None (Preliminary result)   Collection Time: 09/26/15  3:02 PM  Result Value Ref Range Status   Specimen Description WOUND  Final   Special Requests Normal  Final   Gram Stain PENDING  Incomplete   Culture   Final    STAPHYLOCOCCUS AUREUS SUSCEPTIBILITIES TO FOLLOW    Report Status PENDING  Incomplete   Studies/Results: Dg Chest 2 View  09/25/2015  CLINICAL DATA:  Fever.  Hypertension and back pain. EXAM: CHEST  2 VIEW COMPARISON:  09/23/2015 FINDINGS: There is a right IJ catheter with tip in the projection of the right atrium. Heart size is moderately enlarged. No pleural effusion or edema. Asymmetric elevation of the right hemidiaphragm is identified. IMPRESSION: 1. No acute  findings. 2. Cardiac enlargement and asymmetric elevation of right hemidiaphragm. Electronically Signed   By: Kerby Moors M.D.   On: 09/25/2015 16:46    Assessment/Plan: Ronnie Keith is a 55 y.o. male with Htn, CKD now on HD with fevers on day 6 of admission. [name]. WBC nml. CXR neg. UA on admit neg for infection, does not currently have a foley.  HIV and Flu negative Today noticed R antecub pustule at old IV site with induration.  cx is Staph aureus Will cont vanco for probable R arm thrombophlebitis and apply warm compress.  I note his LFTs are nml and had neg CT recently of abd so I suspect his Abd pain is not related to his fever  Thank you very much for the consult. Will follow with you.  [place], [place]   09/27/2015, 2:52 PM

## 2015-09-27 NOTE — Progress Notes (Signed)
[place] [facility] [place] NAME: Ronnie Keith    MR#:  [id number]  DATE OF BIRTH:  Nov 11, 1960   seen during hemodialysis, complains of abdominal pain. No fever. No nausea.    REVIEW OF SYSTEMS:   Review of Systems  Constitutional: Negative for fever, chills and weight loss.  HENT: Negative for ear discharge, ear pain and nosebleeds.   Eyes: Negative for blurred vision, pain and discharge.  Respiratory: Negative for sputum production, shortness of breath, wheezing and stridor.   Cardiovascular: Positive for leg swelling. Negative for chest pain, palpitations, orthopnea and PND.  Gastrointestinal: Positive for abdominal pain. Negative for nausea, vomiting and diarrhea.  Genitourinary: Negative for urgency and frequency.  Musculoskeletal: Negative for back pain and joint pain.  Neurological: Negative for sensory change, speech change, focal weakness and weakness.  Psychiatric/Behavioral: Negative for depression and hallucinations. The patient is not nervous/anxious.    Tolerating Diet:yes Tolerating PT: not needed  DRUG ALLERGIES:   Allergies  Allergen Reactions  . Heparin     Thrombocytopenia.  HIT panel positive, awaiting SRA results.    VITALS:  Blood pressure 138/79, pulse 83, temperature 98.9 F (37.2 C), temperature source Oral, resp. rate 13, height 6' (1.829 m), weight 117.4 kg (258 lb 13.1 oz), SpO2 97 %. PHYSICAL EXAMINATION:   Physical Exam  GENERAL:  55 y.o.-year-old patient lying in the bed with no acute distress.  EYES: Pupils equal, round, reactive to light and accommodation. No scleral icterus. Extraocular muscles intact.  HEENT: Head atraumatic, normocephalic. Oropharynx and nasopharynx clear.  NECK:  Supple, no jugular venous distention. No thyroid enlargement, no tenderness.  LUNGS: Normal breath sounds bilaterally, no wheezing, rales, rhonchi. No use of accessory muscles of respiration.  CARDIOVASCULAR: S1, S2  normal. No murmurs, rubs, or gallops.  ABDOMEN:Mild epigastric tenderness present,no rebound tenderness, bowel sounds presentS: No cyanosis, clubbing , ++ edema b/l.   NEUROLOGIC: Cranial nerves II through XII are intact. No focal Motor or sensory deficits b/l.   PSYCHIATRIC: The patient is alert and oriented x 3.  SKIN: No obvious rash, lesion, or ulcer.   LABORATORY PANEL:  CBC  Recent Labs Lab 09/27/15 1100  WBC 5.0  HGB 10.0*  HCT 30.3*  PLT 79*    Chemistries   Recent Labs Lab 09/25/15 1535 09/27/15 1100  NA 138 136  K 4.1 4.2  CL 101 101  CO2 30 24  GLUCOSE 117* 111*  BUN 24* 41*  CREATININE 3.39* 5.38*  CALCIUM 7.8* 7.9*  AST 13*  --   ALT 16*  --   ALKPHOS 107  --   BILITOT 0.8  --     Cardiac Enzymes  Recent Labs Lab 09/20/15 1344  TROPONINI 0.04*   RADIOLOGY:  Dg Chest 2 View  09/25/2015  CLINICAL DATA:  Fever.  Hypertension and back pain. EXAM: CHEST  2 VIEW COMPARISON:  09/23/2015 FINDINGS: There is a right IJ catheter with tip in the projection of the right atrium. Heart size is moderately enlarged. No pleural effusion or edema. Asymmetric elevation of the right hemidiaphragm is identified. IMPRESSION: 1. No acute findings. 2. Cardiac enlargement and asymmetric elevation of right hemidiaphragm. Electronically Signed   By: Kerby Moors M.D.   On: 09/25/2015 16:46   ASSESSMENT AND PLAN:   55 year old Caucasian gentleman history of essential hypertension is presenting with elevated blood pressure.  1. R arm thrombophlebitis: antecub pustule seen at old IV site  ;continue  the IV  vancomycin as per ID recommendation. 2. Hypertensive urgency: Continue Coreg, clonidine and hydralazine - blood pressures improved  3. End-stage renal disease now on hemodialysis -Tolerated first treatment -s/p Right IJ temp cath placement  09/23/15 by Dr schnier -Patient will need permanent catheter this week -   4. Hyperkalemia: Received Kayexalate potassium  improved -Resolved -off telemetry  5.abdominal pain likely due to constipation: Continue PPIs, stool softeners.  * Venous thromboembolism prophylactic: Heparin subcutaneous   Case discussed with Care Management/Social Worker. Management plans discussed with the patient, family and they are in agreement.  CODE STATUS: full  DVT Prophylaxis: heparin  TOTAL TIME TAKING CARE OF THIS PATIENT: 30 minutes.     POSSIBLE D/C IN 2-3 DAYS, DEPENDING ON CLINICAL CONDITION.- needs permacath  Note: This dictation was prepared with Dragon dictation along with smaller phrase technology. Any transcriptional errors that result from this process are unintentional.  Rainn Bullinger M.D on 09/27/2015 at 1:40 PM  Between 7am to 6pm - Pager - [phone]  After 6pm go to [email or web address] - password [email or web address] [facility]  [facility]  Office  [phone]  CC: Primary care physician; No primary care provider on file.

## 2015-09-27 NOTE — Progress Notes (Signed)
Pre-hd tx 

## 2015-09-28 ENCOUNTER — Encounter
Admission: EM | Disposition: A | Discharge status: Home / Self Care | Payer: Self-pay | Source: Home / Self Care | Attending: Internal Medicine

## 2015-09-28 DIAGNOSIS — F332 Major depressive disorder, recurrent severe without psychotic features: Secondary | ICD-10-CM

## 2015-09-28 DIAGNOSIS — F331 Major depressive disorder, recurrent, moderate: Secondary | ICD-10-CM

## 2015-09-28 LAB — CBC
HEMATOCRIT: 27.7 % — AB (ref 40.0–52.0)
Hemoglobin: 9.2 g/dL — ABNORMAL LOW (ref 13.0–18.0)
MCH: 29.8 pg (ref 26.0–34.0)
MCHC: 33.2 g/dL (ref 32.0–36.0)
MCV: 89.9 fL (ref 80.0–100.0)
PLATELETS: 79 10*3/uL — AB (ref 150–440)
RBC: 3.08 MIL/uL — AB (ref 4.40–5.90)
RDW: 13 % (ref 11.5–14.5)
WBC: 5.2 10*3/uL (ref 3.8–10.6)

## 2015-09-28 LAB — TROPONIN I: Troponin I: 0.04 ng/mL — ABNORMAL HIGH (ref <0.031)

## 2015-09-28 SURGERY — DIALYSIS/PERMA CATHETER INSERTION
Anesthesia: Moderate Sedation

## 2015-09-28 MED ORDER — ALPRAZOLAM 1 MG PO TABS
1.0000 mg | ORAL_TABLET | Freq: Three times a day (TID) | ORAL | Status: DC | PRN
  Administered 2015-09-28 – 2015-10-06 (×9): 1 mg via ORAL
  Filled 2015-09-28 (×11): qty 1

## 2015-09-28 MED ORDER — CEFAZOLIN SODIUM 1-5 GM-% IV SOLN
1.0000 g | INTRAVENOUS | Status: DC
Start: 2015-09-28 — End: 2015-10-06
  Administered 2015-09-28 – 2015-10-05 (×7): 1 g via INTRAVENOUS
  Filled 2015-09-28 (×11): qty 50

## 2015-09-28 MED ORDER — MIRTAZAPINE 15 MG PO TBDP
15.0000 mg | ORAL_TABLET | Freq: Every day | ORAL | Status: DC
  Administered 2015-09-28 – 2015-10-03 (×6): 15 mg via ORAL
  Filled 2015-09-28 (×6): qty 1

## 2015-09-28 MED ORDER — NITROGLYCERIN 0.4 MG SL SUBL
0.4000 mg | SUBLINGUAL_TABLET | SUBLINGUAL | Status: DC | PRN
  Administered 2015-09-28 (×2): 0.4 mg via SUBLINGUAL
  Filled 2015-09-28 (×2): qty 1

## 2015-09-28 NOTE — Care Management Note (Signed)
All records have been sent to [facility] admission intake.  Conversations are currently taking place within the dialysis company in regards to patients lack of insurance coverage.  I will update note as soon as I hear back from them. Placement is planned for [facility].  Iran Sizer   Dialysis Liaison  [phone]

## 2015-09-28 NOTE — Progress Notes (Signed)
Patient refused to sign consents for procedure and refused CHG bath. Patient stated that he was too sick.   Christene Slates  09/28/2015  6:19 AM

## 2015-09-28 NOTE — Progress Notes (Signed)
[place] at [place] NAME: Ronnie Keith    MR#:  [id number]  DATE OF BIRTH:  Mar 18, 1961 He is very agitated this morning, anxious about permacath placement. He also had suicidal ideation with hurting himself a shot gun. He was never been admitted to hospital, he is worried about his dialysis catheter and hold disease the process. I explained to him that we can give him Xanax to help with anxiety. Patient denies any suicidal or homicidal ideation at this time. But he does not eat.  REVIEW OF SYSTEMS:   Review of Systems  Constitutional: Negative for fever, chills and weight loss.  HENT: Negative for ear discharge, ear pain and nosebleeds.   Eyes: Negative for blurred vision, pain and discharge.  Respiratory: Negative for sputum production, shortness of breath, wheezing and stridor.   Cardiovascular: Positive for leg swelling. Negative for chest pain, palpitations, orthopnea and PND.  Gastrointestinal: Negative for nausea, vomiting and diarrhea.  Genitourinary: Negative for urgency and frequency.  Musculoskeletal: Negative for back pain and joint pain.  Neurological: Negative for sensory change, speech change, focal weakness and weakness.  Psychiatric/Behavioral: Negative for depression and hallucinations. The patient is not nervous/anxious.    Tolerating Diet:yes Tolerating PT: not needed  DRUG ALLERGIES:   Allergies  Allergen Reactions  . Heparin     Thrombocytopenia.  HIT panel positive, awaiting SRA results.    VITALS:  Blood pressure 124/61, pulse 81, temperature 98.6 F (37 C), temperature source Oral, resp. rate 18, height 6' (1.829 m), weight 117.6 kg (259 lb 4.2 oz), SpO2 98 %. PHYSICAL EXAMINATION:   Physical Exam  GENERAL:  55 y.o.-year-old patient lying in the bed with no acute distress.  EYES: Pupils equal, round, reactive to light and accommodation. No scleral icterus. Extraocular muscles intact.  HEENT: Head  atraumatic, normocephalic. Oropharynx and nasopharynx clear.  NECK:  Supple, no jugular venous distention. No thyroid enlargement, no tenderness.  LUNGS: Normal breath sounds bilaterally, no wheezing, rales, rhonchi. No use of accessory muscles of respiration.  CARDIOVASCULAR: S1, S2 normal. No murmurs, rubs, or gallops.  ABDOMEN; bowel sounds present,no rebound tenderness, bowel sounds presentS: No cyanosis, clubbing , ++ edema b/l.   NEUROLOGIC: Cranial nerves II through XII are intact. No focal Motor or sensory deficits b/l.   PSYCHIATRIC: The patient is alert and oriented x 3.  SKIN: No obvious rash, lesion, or ulcer.   LABORATORY PANEL:  CBC  Recent Labs Lab 09/28/15 0621  WBC 5.2  HGB 9.2*  HCT 27.7*  PLT 79*    Chemistries   Recent Labs Lab 09/25/15 1535 09/27/15 1100  NA 138 136  K 4.1 4.2  CL 101 101  CO2 30 24  GLUCOSE 117* 111*  BUN 24* 41*  CREATININE 3.39* 5.38*  CALCIUM 7.8* 7.9*  AST 13*  --   ALT 16*  --   ALKPHOS 107  --   BILITOT 0.8  --     Cardiac Enzymes  Recent Labs Lab 09/28/15 0621  TROPONINI 0.04*   RADIOLOGY:  No results found. ASSESSMENT AND PLAN:   55 year old Caucasian gentleman history of essential hypertension is presenting with elevated blood pressure.  1. R arm thrombophlebitis: antecub pustule seen at old IV site  ;continue  the IV vancomycin as per ID recommendation. 2. Hypertensive urgency: Continue Coreg, clonidine and hydralazine - blood pressures improved  3. End-stage renal disease now on hemodialysis -Tolerated first treatment -s/p Right IJ temp cath placement  09/23/15 by Dr schnier Unable to do  permacath today because he was very agitated and anxious. Will try tomorrow.  4. Hyperkalemia: Received Kayexalate potassium improved -Resolved -off telemetry  5.abdominal pain likely due to constipation: Continue PPIs, stool softeners. 6.PAssive suicidal ideation;: Likely due to anxiety, possible  adjustment.disorder; According to  Aunt ,he is never been  Admitted tothe hospital. gettting worried  about his renal disease. Will consult psychiatric.continue Sitter.  * Venous thromboembolism prophylactic: Heparin subcutaneous   Case discussed with Care Management/Social Worker. Management plans discussed with the patient, family and they are in agreement.  CODE STATUS: full  DVT Prophylaxis: heparin  TOTAL TIME TAKING CARE OF THIS PATIENT: 30 minutes.     POSSIBLE D/C IN 2-3 DAYS, DEPENDING ON CLINICAL CONDITION.- needs permacath  Note: This dictation was prepared with Dragon dictation along with smaller phrase technology. Any transcriptional errors that result from this process are unintentional.  Shauntia Levengood M.D on 09/28/2015 at 1:21 PM  Between 7am to 6pm - Pager - [phone]  After 6pm go to [email or web address] - password [email or web address]  [facility]  Office  [phone]  CC: Primary care physician; No primary care provider on file.

## 2015-09-28 NOTE — Progress Notes (Signed)
Pt became very hysterical and refusing pain medicine even though he voiced pain and twitching in his legs. Pt also made comment this morning about wishing someone would give him a shotgun. Asked MD for a psych consult. Md acknowledged she would called psych. Will continue to monitor. Aunt and sitter at bedside. Dr. Juleen China also aware.

## 2015-09-28 NOTE — Progress Notes (Signed)
Pt C/O chest pain that is a lot of pressure. MD paged and orders given for EKG, Nitro, and Troponins. Will continue to monitor.

## 2015-09-28 NOTE — Progress Notes (Signed)
Patient scheduled for a Permcath today but became combative and refused procedure.  Aggressive towards nursing and adamant that he does not want this done. Tight schedule window today will not allow for placement of catheter if he decides to have this done. If he becomes agreeable with procedure, will try to do tomorrow. If he does not agree with procedure and refuses dialysis, hospice should be considered

## 2015-09-28 NOTE — Consult Note (Signed)
[facility] Face-to-Face Psychiatry Consult   Reason for Consult:  Consult for this 55 year old man who is currently in the hospital because of end-stage renal disease now necessitating dialysis and beginning the process of hemodialysis. Consult for depression and suicidal statements. Referring Physician:  Vianne Bulls Patient Identification: Ronnie Keith MRN:  [id number] Principal Diagnosis: Depression, major, recurrent, moderate ([place]) Diagnosis:   Patient Active Problem List   Diagnosis Date Noted  . Depression, major, recurrent, moderate ([place]) [F33.1] 09/28/2015  . Adjustment disorder with mixed anxiety and depressed mood [F43.23] 09/28/2015  . Hypertensive urgency [I16.0] 09/20/2015  . Acute kidney injury ([place]) [N17.9] 09/20/2015  . Hyperkalemia [E87.5] 09/20/2015    Total Time spent with patient: 1 hour  Subjective:   Ronnie Keith is a 55 y.o. male patient admitted with "I'm just very nervous".  HPI:  Patient interviewed. Chart reviewed. Labs and vitals reviewed. Looked through old notes. This is a 55 year old man who is in the hospital now because he is renal disease has gotten to end-stage. He has had to begin hemodialysis. Patient has expressed a lot of frustration and earlier today he apparently said that he wished that he could kill himself. In interview today the patient says that he did not really mean that he wanted to kill himself. He says he does not actually want to die but he was feeling very frustrated and that was just a figure of speech. He admits that his mood is been very bad and very nervous. He gets stressed out by medical issues. Interestingly, he tells me the thing that upsets him the most is the possibility that a dialysis catheter will add to his unusual physical appearance. He talked quite a bit about how he has suffered through his life with people making fun of him for the way he walks and his physical appearance. Now that he realizes that the catheter will not be as visible as  the one protruding from his neck he is a little bit relieved. Nevertheless his mood recently has been down. He's been sleeping more poorly than usual. He feels panicky and nervous much of the time. His any hallucinations or psychotic symptoms. In addition to the medical problems and now the hemodialysis he lost his job last September which was very upsetting to him. He also worries about the precarious nature of his social situation since he lives with his mother who is elderly and his sister who evidently is even more impaired than he is.  Social history: Patient lives with his elderly mother and disabled sister. He is worried about how they will be able to take care of themselves since it sounds like he actually had been the most functional 1 until recently doing most of the driving. His father died several years ago and he still gets very tearful about that. Doesn't appear to be working currently. He tells me in an offhand manner that he does not have any friends and has never felt like he had a friend in his life.  Medical history: Patient has end-stage renal disease and is now beginning hemodialysis. High blood pressure. Had been off of his medicine recently.  Substance abuse history: Does not drink no substance abuse no past history of substance abuse.  Past Psychiatric History: Patient describes having episodes of depression in the past. He has never tried to kill himself. Never been in a psychiatric hospital. He is somewhat unclear about whether and what medicine he might of ever been prescribed. He denies ever trying  to harm himself and there is no history of psychosis.  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 Is it possible that this has gone somehow at least partially overlooked? 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 His would help to explain some of the behavior impulsivity and slightly childlike nature to his behavior.  Risk to Self: Is patient at risk for suicide?: No Risk to Others:   Prior Inpatient Therapy:   Prior Outpatient Therapy:    Past Medical History:  Past Medical History  Diagnosis Date  . Hypertension   . Chronic low back pain   . Scoliosis   . Anxiety   . Sciatica    History reviewed. No pertinent past surgical history. Family History:  Family History  Problem Relation Age of Onset  . Hypertension Other    Family Psychiatric  History: Patient denies knowledge of any family history of mental health problems Social History:  History  Alcohol Use No     History  Drug Use No    Social History   Social History  . Marital Status: Single    Spouse Name: N/A  . Number of Children: N/A  . Years of Education: N/A   Social History Main Topics  . Smoking status: Never Smoker   . Smokeless tobacco: Never Used  . Alcohol Use: No  . Drug Use: No  . Sexual Activity: Not Currently   Other Topics Concern  . None   Social History Narrative   Additional Social History:                          Allergies:   Allergies  Allergen Reactions  . Heparin     Thrombocytopenia.  HIT panel positive, awaiting SRA results.    Labs:  Results for orders placed or performed during the hospital encounter of 09/19/15 (from the past 48 hour(s))  Renal function panel     Status: Abnormal   Collection Time: 09/27/15 11:00 AM  Result Value Ref Range   Sodium 136 135 - 145 mmol/L   Potassium 4.2 3.5 - 5.1 mmol/L   Chloride 101 101 - 111 mmol/L   CO2 24 22 - 32 mmol/L   Glucose, Bld 111 (H) 65 - 99 mg/dL   BUN 41 (H) 6 - 20 mg/dL   Creatinine, Ser 5.38 (H) 0.61 - 1.24 mg/dL   Calcium 7.9 (L) 8.9 -  10.3 mg/dL   Phosphorus 3.1 2.5 - 4.6 mg/dL   Albumin 2.9 (L) 3.5 - 5.0 g/dL   GFR calc non Af Amer 11 (L) >60 mL/min   GFR calc Af Amer 13 (L) >60 mL/min    Comment: (NOTE) The eGFR has been calculated using the CKD EPI equation. This calculation has not been validated in all clinical situations. eGFR's persistently <60 mL/min signify possible Chronic Kidney Disease.    Anion gap 11 5 - 15  CBC     Status: Abnormal   Collection Time: 09/27/15 11:00 AM  Result Value Ref Range   WBC 5.0 3.8 - 10.6 K/uL   RBC 3.36 (L) 4.40 - 5.90 MIL/uL  Hemoglobin 10.0 (L) 13.0 - 18.0 g/dL   HCT 30.3 (L) 40.0 - 52.0 %   MCV 90.3 80.0 - 100.0 fL   MCH 29.7 26.0 - 34.0 pg   MCHC 32.9 32.0 - 36.0 g/dL   RDW 13.3 11.5 - 14.5 %   Platelets 79 (L) 150 - 440 K/uL  CBC     Status: Abnormal   Collection Time: 09/28/15  6:21 AM  Result Value Ref Range   WBC 5.2 3.8 - 10.6 K/uL   RBC 3.08 (L) 4.40 - 5.90 MIL/uL   Hemoglobin 9.2 (L) 13.0 - 18.0 g/dL   HCT 27.7 (L) 40.0 - 52.0 %   MCV 89.9 80.0 - 100.0 fL   MCH 29.8 26.0 - 34.0 pg   MCHC 33.2 32.0 - 36.0 g/dL   RDW 13.0 11.5 - 14.5 %   Platelets 79 (L) 150 - 440 K/uL  Troponin I     Status: Abnormal   Collection Time: 09/28/15  6:21 AM  Result Value Ref Range   Troponin I 0.04 (H) <0.031 ng/mL    Comment: READ BACK AND VERIFIED WITH GINA DENTON AT 1610 09/28/15 DAS        PERSISTENTLY INCREASED TROPONIN VALUES IN THE RANGE OF 0.04-0.49 ng/mL CAN BE SEEN IN:       -UNSTABLE ANGINA       -CONGESTIVE HEART FAILURE       -MYOCARDITIS       -CHEST TRAUMA       -ARRYHTHMIAS       -LATE PRESENTING MYOCARDIAL INFARCTION       -COPD   CLINICAL FOLLOW-UP RECOMMENDED.     Current Facility-Administered Medications  Medication Dose Route Frequency Provider Last Rate Last Dose  . 0.9 %  sodium chloride infusion   Intravenous Continuous Algernon Huxley, MD      . acetaminophen (TYLENOL) tablet 650 mg  650 mg Oral Q6H PRN Lytle Butte, MD   650 mg at  09/28/15 9604   Or  . acetaminophen (TYLENOL) suppository 650 mg  650 mg Rectal Q6H PRN Lytle Butte, MD      . ALPRAZolam Duanne Moron) tablet 1 mg  1 mg Oral TID PRN Epifanio Lesches, MD   1 mg at 09/28/15 1412  . alteplase (CATHFLO ACTIVASE) injection 2 mg  2 mg Intracatheter Once PRN Munsoor Lateef, MD      . aspirin EC tablet 81 mg  81 mg Oral Daily Lytle Butte, MD   81 mg at 09/28/15 1412  . carvedilol (COREG) tablet 12.5 mg  12.5 mg Oral BID WC Lytle Butte, MD   12.5 mg at 09/28/15 1821  . ceFAZolin (ANCEF) IVPB 1 g/50 mL premix  1 g Intravenous Q24H Adrian Prows, MD   1 g at 09/28/15 1821  . Chlorhexidine Gluconate Cloth 2 % PADS 6 each  6 each Topical Once Algernon Huxley, MD      . cloNIDine (CATAPRES) tablet 0.1 mg  0.1 mg Oral BID Dustin Flock, MD   0.1 mg at 09/27/15 2115  . hydrALAZINE (APRESOLINE) injection 10 mg  10 mg Intravenous Q4H PRN Lytle Butte, MD      . hydrALAZINE (APRESOLINE) tablet 25 mg  25 mg Oral 3 times per day Dustin Flock, MD   25 mg at 09/27/15 2115  . lidocaine (PF) (XYLOCAINE) 1 % injection 5 mL  5 mL Intradermal PRN Munsoor Lateef, MD      . lidocaine-prilocaine (EMLA) cream  1 application  1 application Topical PRN Munsoor Lateef, MD      . mirtazapine (REMERON SOL-TAB) disintegrating tablet 15 mg  15 mg Oral QHS Gonzella Lex, MD      . morphine 2 MG/ML injection 2 mg  2 mg Intravenous Q4H PRN Lytle Butte, MD   2 mg at 09/28/15 1128  . nitroGLYCERIN (NITROSTAT) SL tablet 0.4 mg  0.4 mg Sublingual Q5 min PRN Epifanio Lesches, MD   0.4 mg at 09/28/15 0817  . ondansetron (ZOFRAN) tablet 4 mg  4 mg Oral Q6H PRN Lytle Butte, MD   4 mg at 09/25/15 1738   Or  . ondansetron [facility]) injection 4 mg  4 mg Intravenous Q6H PRN Lytle Butte, MD   4 mg at 09/28/15 1412  . oxyCODONE (Oxy IR/ROXICODONE) immediate release tablet 5 mg  5 mg Oral Q4H PRN Lytle Butte, MD   5 mg at 09/28/15 1540  . pentafluoroprop-tetrafluoroeth (GEBAUERS) aerosol 1  application  1 application Topical PRN Munsoor Lateef, MD      . senna-docusate (Senokot-S) tablet 1 tablet  1 tablet Oral BID Max Sane, MD   1 tablet at 09/28/15 1540  . sodium chloride 0.9 % injection 3 mL  3 mL Intravenous PRN Lytle Butte, MD        Musculoskeletal: Strength & Muscle Tone: within normal limits Gait & Station: broad based, Patient does have scoliosis and although I didn't see him walk he describes himself as always having a very unsteady gait. Patient leans: Right  Psychiatric Specialty Exam: Review of Systems  Constitutional: Negative.   HENT: Negative.        Patient has pain in his neck with catheter remains.  Eyes: Negative.   Respiratory: Negative.   Cardiovascular: Negative.   Gastrointestinal: Negative.   Musculoskeletal: Positive for back pain.  Skin: Negative.   Neurological: Negative.   Psychiatric/Behavioral: Positive for depression. Negative for suicidal ideas, hallucinations, memory loss and substance abuse. The patient is nervous/anxious and has insomnia.     Blood pressure 120/40, pulse 84, temperature 98.6 F (37 C), temperature source Oral, resp. rate 18, height 6' (1.829 m), weight 117.6 kg (259 lb 4.2 oz), SpO2 98 %.Body mass index is 35.15 kg/(m^2).  General Appearance: Casual  Eye Contact::  Good  Speech:  Clear and Coherent  Volume:  Normal  Mood:  Depressed  Affect:  Tearful  Thought Process:  Goal Directed  Orientation:  Full (Time, Place, and Person)  Thought Content:  Negative  Suicidal Thoughts:  No  Homicidal Thoughts:  No  Memory:  Immediate;   Good Recent;   Fair Remote;   Fair  Judgement:  Intact  Insight:  Present  Psychomotor Activity:  Normal  Concentration:  Good  Recall:  Good  Fund of Knowledge:Good  Language: Good  Akathisia:  No  Handed:  Right  AIMS (if indicated):     Assets:  Communication Skills Desire for Improvement Financial Resources/Insurance Housing Resilience Social Support  ADL's:  Intact   Cognition: Impaired,  Mild  Sleep:      Treatment Plan Summary: Daily contact with patient to assess and evaluate symptoms and progress in treatment, Medication management and Plan Supportive therapy done. I spent quite a bit of time with him listening to some of the stories about the problems he has had in the past. Offered support around his new medical problems. Discussed his fears with him. Patient is not currently suicidal certainly does  not require psychiatric inpatient treatment. I do think it would be reasonable to try a small dose at least to start with mirtazapine which may help him to sleep better. 15 mg should be safe with his renal function. I will continue to follow-up with that. I described to him and he was agreeable.  Disposition: Patient does not meet criteria for psychiatric inpatient admission. Supportive therapy provided about ongoing stressors.  John Clapacs 09/28/2015 7:38 PM

## 2015-09-28 NOTE — Progress Notes (Signed)
ANTIBIOTIC CONSULT NOTE - INITIAL  Pharmacy Consult for Cefazolin Indication: Wound infection  Allergies  Allergen Reactions  . Heparin     Thrombocytopenia.  HIT panel positive, awaiting SRA results.    Patient Measurements: Height: 6' (182.9 cm) Weight: 259 lb 4.2 oz (117.6 kg) IBW/kg (Calculated) : 77.6  Vital Signs: Temp: 98.6 F (37 C) (01/26 0741) Temp Source: Oral (01/26 0741) BP: 120/40 mmHg (01/26 1403) Pulse Rate: 84 (01/26 1403) Intake/Output from previous day: 01/25 0701 - 01/26 0700 In: 240 [P.O.:240] Out: 2575 [Urine:1075] Intake/Output from this shift: Total I/O In: 120 [P.O.:120] Out: 300 [Urine:300]  Labs:  Recent Labs  09/25/15 1535 09/26/15 0947 09/27/15 1100 09/28/15 0621  WBC  --  3.1* 5.0 5.2  HGB  --  9.4* 10.0* 9.2*  PLT  --  76* 79* 79*  CREATININE 3.39*  --  5.38*  --    Estimated Creatinine Clearance: 21.3 mL/min (by C-G formula based on Cr of 5.38). No results for input(s): VANCOTROUGH, VANCOPEAK, VANCORANDOM, GENTTROUGH, GENTPEAK, GENTRANDOM, TOBRATROUGH, TOBRAPEAK, TOBRARND, AMIKACINPEAK, AMIKACINTROU, AMIKACIN in the last 72 hours.   Microbiology: Recent Results (from the past 720 hour(s))  CULTURE, BLOOD (ROUTINE X 2) w Reflex to PCR ID Panel     Status: None (Preliminary result)   Collection Time: 09/25/15 11:08 AM  Result Value Ref Range Status   Specimen Description BLOOD LEFT HAND  Final   Special Requests BOTTLES DRAWN AEROBIC AND ANAEROBIC [place]  Final   Culture NO GROWTH 3 DAYS  Final   Report Status PENDING  Incomplete  CULTURE, BLOOD (ROUTINE X 2) w Reflex to PCR ID Panel     Status: None (Preliminary result)   Collection Time: 09/25/15  3:41 PM  Result Value Ref Range Status   Specimen Description BLOOD LINE  Final   Special Requests   Final    BOTTLES DRAWN AEROBIC AND ANAEROBIC AERO 8CC ANA 14CC   Culture NO GROWTH 3 DAYS  Final   Report Status PENDING  Incomplete  Wound culture     Status: None  (Preliminary result)   Collection Time: 09/26/15  3:02 PM  Result Value Ref Range Status   Specimen Description WOUND  Final   Special Requests Normal  Final   Gram Stain RARE WBC SEEN NO ORGANISMS SEEN   Final   Culture STAPHYLOCOCCUS AUREUS  Final   Report Status PENDING  Incomplete   Organism ID, Bacteria STAPHYLOCOCCUS AUREUS  Final      Susceptibility   Staphylococcus aureus - MIC*    OXACILLIN Value in next row Sensitive      SENSITIVE<=0.25    GENTAMICIN Value in next row Sensitive      SENSITIVE<=0.5    CIPROFLOXACIN Value in next row Sensitive      SENSITIVE<=0.5    ERYTHROMYCIN Value in next row Sensitive      SENSITIVE<=0.25    CLINDAMYCIN Value in next row Sensitive      SENSITIVE<=0.25    VANCOMYCIN Value in next row Sensitive      SENSITIVE1    TETRACYCLINE Value in next row Sensitive      SENSITIVE<=1    RIFAMPIN Value in next row Sensitive      SENSITIVE<=0.5    TRIMETH/SULFA Value in next row Sensitive      SENSITIVE<=10    * STAPHYLOCOCCUS AUREUS    Medical History: Past Medical History  Diagnosis Date  . Hypertension   . Chronic low back pain   . Scoliosis   .  Anxiety   . Sciatica     Medications:  Scheduled:  . aspirin EC  81 mg Oral Daily  . carvedilol  12.5 mg Oral BID WC  .  ceFAZolin (ANCEF) IV  1 g Intravenous Q24H  . Chlorhexidine Gluconate Cloth  6 each Topical Once  . cloNIDine  0.1 mg Oral BID  . hydrALAZINE  25 mg Oral 3 times per day  . senna-docusate  1 tablet Oral BID   Assessment: Pharmacy consulted to dose Cefazolin in a 55 yo male with right arm thrombophlebitis.  Patient with ESRD requiring HD.  Wound Cultures growing oxacillin sensitive staph aureus.  Patient has received IV Vancomycin therapy for 3 days.  MD desires to narrow to Cefazolin.    Goal of Therapy:  Resolution of infection  Plan:  Will transition patient to Cefazolin 1 gm IV q24h with doses scheduled to be given after HD on HD days.   Pharmacy will  continue to follow.    Selim Durden G 09/28/2015,3:11 PM

## 2015-09-28 NOTE — Progress Notes (Signed)
Per MD, Orders given make diet order clear liquids from Midnight to 0900. Then at 0900 make NPO for procedure.

## 2015-09-28 NOTE — Progress Notes (Signed)
MD verbally told of critical troponin of 0.04. No new orders given.

## 2015-09-28 NOTE — Progress Notes (Signed)
[facility]  ROUNDING NOTE   Subjective:   Aunt at bedside. Patient refusing tunneled catheter placement.  Hemodialysis treatment yesterday. Tolerated treatment well. UF of 1.5 litres  Objective:  Vital signs in last 24 hours:  Temp:  [98.1 F (36.7 C)-98.6 F (37 C)] 98.6 F (37 C) (01/26 0741) Pulse Rate:  [78-85] 81 (01/26 0741) Resp:  [18-20] 18 (01/26 0532) BP: (82-124)/(35-71) 124/61 mmHg (01/26 0741) SpO2:  [95 %-98 %] 98 % (01/26 0741) Weight:  [117.6 kg (259 lb 4.2 oz)] 117.6 kg (259 lb 4.2 oz) (01/26 0607)  Weight change:  Filed Weights   09/27/15 1000 09/27/15 1312 09/28/15 0607  Weight: 118.9 kg (262 lb 2 oz) 117.4 kg (258 lb 13.1 oz) 117.6 kg (259 lb 4.2 oz)    Intake/Output: I/O last 3 completed shifts: In: Miller [P.O.:960] Out: [id number] [Urine:1950; Other:1500]   Intake/Output this shift:  Total I/O In: 0  Out: 300 [Urine:300]  Physical Exam: General: NAD, laying in bed.   Head: Normocephalic, atraumatic. Moist oral mucosal membranes  Eyes: Anicteric, PERRL  Neck: Supple, trachea midline  Lungs:  Clear to auscultation normal effort  Heart: Regular rate and rhythm  Abdomen:  Soft, nontender, BS present  Extremities: 1+ peripheral edema.  Neurologic: Nonfocal, moving all four extremities  Skin: No lesions  Access: RIJ temp HD catheter 09/23/2015 Dr. Delana Meyer    Basic Metabolic Panel:  Recent Labs Lab 09/22/15 0548 09/23/15 2051 09/24/15 1929 09/25/15 0938 09/25/15 1535 09/27/15 1100  NA 137 136 137 139 138 136  K 5.3* 5.3* 4.7 4.6 4.1 4.2  CL 111 114* 110 104 101 101  CO2 19* 19* 22 27 30 24   GLUCOSE 88 116* 140* 102* 117* 111*  BUN 61* 70* 56* 38* 24* 41*  CREATININE 5.01* 5.15* 4.91* 4.08* 3.39* 5.38*  CALCIUM 7.2* 7.2* 7.6* 7.9* 7.8* 7.9*  PHOS 5.3* 5.2* 4.2 4.0  --  3.1    Liver Function Tests:  Recent Labs Lab 09/23/15 2051 09/24/15 1929 09/25/15 0938 09/25/15 1535 09/27/15 1100  AST  --   --   --  13*  --   ALT   --   --   --  16*  --   ALKPHOS  --   --   --  107  --   BILITOT  --   --   --  0.8  --   PROT  --   --   --  5.9*  --   ALBUMIN 3.1* 3.4* 3.2* 3.1* 2.9*   No results for input(s): LIPASE, AMYLASE in the last 168 hours. No results for input(s): AMMONIA in the last 168 hours.  CBC:  Recent Labs Lab 09/24/15 1929 09/25/15 0938 09/26/15 0947 09/27/15 1100 09/28/15 0621  WBC 5.1 5.2 3.1* 5.0 5.2  HGB 9.8* 9.9* 9.4* 10.0* 9.2*  HCT 29.9* 29.6* 28.9* 30.3* 27.7*  MCV 90.0 89.1 88.9 90.3 89.9  PLT 86* 84* 76* 79* 79*    Cardiac Enzymes:  Recent Labs Lab 09/28/15 0621  TROPONINI 0.04*    BNP: Invalid input(s): POCBNP  CBG: No results for input(s): GLUCAP in the last 168 hours.  Microbiology: Results for orders placed or performed during the hospital encounter of 09/19/15  CULTURE, BLOOD (ROUTINE X 2) w Reflex to PCR ID Panel     Status: None (Preliminary result)   Collection Time: 09/25/15 11:08 AM  Result Value Ref Range Status   Specimen Description BLOOD LEFT HAND  Final   Special  Requests BOTTLES DRAWN AEROBIC AND ANAEROBIC [place]  Final   Culture NO GROWTH 3 DAYS  Final   Report Status PENDING  Incomplete  CULTURE, BLOOD (ROUTINE X 2) w Reflex to PCR ID Panel     Status: None (Preliminary result)   Collection Time: 09/25/15  3:41 PM  Result Value Ref Range Status   Specimen Description BLOOD LINE  Final   Special Requests   Final    BOTTLES DRAWN AEROBIC AND ANAEROBIC AERO 8CC ANA 14CC   Culture NO GROWTH 3 DAYS  Final   Report Status PENDING  Incomplete  Wound culture     Status: None (Preliminary result)   Collection Time: 09/26/15  3:02 PM  Result Value Ref Range Status   Specimen Description WOUND  Final   Special Requests Normal  Final   Gram Stain RARE WBC SEEN NO ORGANISMS SEEN   Final   Culture STAPHYLOCOCCUS AUREUS  Final   Report Status PENDING  Incomplete   Organism ID, Bacteria STAPHYLOCOCCUS AUREUS  Final      Susceptibility    Staphylococcus aureus - MIC*    OXACILLIN Value in next row Sensitive      SENSITIVE<=0.25    GENTAMICIN Value in next row Sensitive      SENSITIVE<=0.5    CIPROFLOXACIN Value in next row Sensitive      SENSITIVE<=0.5    ERYTHROMYCIN Value in next row Sensitive      SENSITIVE<=0.25    CLINDAMYCIN Value in next row Sensitive      SENSITIVE<=0.25    VANCOMYCIN Value in next row Sensitive      SENSITIVE1    TETRACYCLINE Value in next row Sensitive      SENSITIVE<=1    RIFAMPIN Value in next row Sensitive      SENSITIVE<=0.5    TRIMETH/SULFA Value in next row Sensitive      SENSITIVE<=10    * STAPHYLOCOCCUS AUREUS    Coagulation Studies: No results for input(s): LABPROT, INR in the last 72 hours.  Urinalysis:  Recent Labs  09/25/15 1920  COLORURINE YELLOW*  LABSPEC 1.010  PHURINE 7.0  GLUCOSEU 50*  HGBUR NEGATIVE  BILIRUBINUR NEGATIVE  KETONESUR TRACE*  PROTEINUR >500*  NITRITE NEGATIVE  LEUKOCYTESUR NEGATIVE      Imaging: No results found.   Medications:   . sodium chloride     . aspirin EC  81 mg Oral Daily  . carvedilol  12.5 mg Oral BID WC  . Chlorhexidine Gluconate Cloth  6 each Topical Once  . cloNIDine  0.1 mg Oral BID  . hydrALAZINE  25 mg Oral 3 times per day  . senna-docusate  1 tablet Oral BID  . vancomycin  1,000 mg Intravenous Q M,W,F-HD   acetaminophen **OR** acetaminophen, ALPRAZolam, alteplase, hydrALAZINE, lidocaine (PF), lidocaine-prilocaine, morphine injection, nitroGLYCERIN, ondansetron **OR** ondansetron (ZOFRAN) IV, oxyCODONE, pentafluoroprop-tetrafluoroeth, sodium chloride  Assessment/ Plan:  55 y.o.white male  with hypertension, chronic low back pain, scoliosis, anxiety, CKD stage IV Baseline Cr 3.4 who was admitted to [facility] on 09/19/2015 for evaluation of uncontrolled hypertension.   1. End Stage Renal Disease: completed Four treatments of hemodialysis.  Baseline creatinine 3.4 on 12/28/14.  Etiology of ESRD unclear but most likely  secondary to uncontrolled HTN and subsequent focal glomeruloscerlosis with significant proteinuria. Serologic work up negative.   -Patient had first hemodialysis treatment 09/23/15. - Will need tunneled permcath placement before discharge, discussed this with length with patient and family  - Next treatment tentatively for Friday  2. Anemia chronic kidney disease. Hgb 9.2 - epo with dialysis treatment.   3. Secondary hyperparathyroidism. PTH 411, phos 4.   - not currently on a binder or vitamin D agent.   4. Hypertension.  Blood pressure at goal. -  continue Coreg, clonidine, and hydralazine   LOS: 8 Ronnie Keith 1/26/20171:58 PM

## 2015-09-29 ENCOUNTER — Encounter
Admission: EM | Disposition: A | Discharge status: Home / Self Care | Payer: Self-pay | Source: Home / Self Care | Attending: Internal Medicine

## 2015-09-29 HISTORY — PX: PERIPHERAL VASCULAR CATHETERIZATION: SHX172C

## 2015-09-29 LAB — WOUND CULTURE: SPECIAL REQUESTS: NORMAL

## 2015-09-29 LAB — RENAL FUNCTION PANEL
ALBUMIN: 2.8 g/dL — AB (ref 3.5–5.0)
Anion gap: 8 (ref 5–15)
BUN: 46 mg/dL — AB (ref 6–20)
CHLORIDE: 103 mmol/L (ref 101–111)
CO2: 28 mmol/L (ref 22–32)
Calcium: 8.1 mg/dL — ABNORMAL LOW (ref 8.9–10.3)
Creatinine, Ser: 5.52 mg/dL — ABNORMAL HIGH (ref 0.61–1.24)
GFR calc Af Amer: 12 mL/min — ABNORMAL LOW (ref >60)
GFR calc non Af Amer: 11 mL/min — ABNORMAL LOW (ref >60)
Glucose, Bld: 86 mg/dL (ref 65–99)
POTASSIUM: 4.3 mmol/L (ref 3.5–5.1)
Phosphorus: 4.1 mg/dL (ref 2.5–4.6)
Sodium: 139 mmol/L (ref 135–145)

## 2015-09-29 LAB — CBC
HEMATOCRIT: 28.3 % — AB (ref 40.0–52.0)
HEMOGLOBIN: 9.3 g/dL — AB (ref 13.0–18.0)
MCH: 29.2 pg (ref 26.0–34.0)
MCHC: 33 g/dL (ref 32.0–36.0)
MCV: 88.6 fL (ref 80.0–100.0)
Platelets: 100 10*3/uL — ABNORMAL LOW (ref 150–440)
RBC: 3.19 MIL/uL — AB (ref 4.40–5.90)
RDW: 13.1 % (ref 11.5–14.5)
WBC: 5.5 10*3/uL (ref 3.8–10.6)

## 2015-09-29 LAB — SEROTONIN RELEASE ASSAY (SRA)
SRA .2 IU/mL UFH Ser-aCnc: 1 % (ref 0–20)
SRA, HIGH DOSE HEPARIN: 1 % (ref 0–20)

## 2015-09-29 SURGERY — DIALYSIS/PERMA CATHETER INSERTION
Anesthesia: Moderate Sedation

## 2015-09-29 MED ORDER — MIDAZOLAM HCL 2 MG/2ML IJ SOLN
INTRAMUSCULAR | Status: AC
  Filled 2015-09-29: qty 4

## 2015-09-29 MED ORDER — MIDAZOLAM HCL 2 MG/ML PO SYRP
10.0000 mg | ORAL_SOLUTION | Freq: Once | ORAL | Status: AC
  Administered 2015-09-29: 10 mg via ORAL
  Filled 2015-09-29: qty 5

## 2015-09-29 MED ORDER — FENTANYL CITRATE (PF) 100 MCG/2ML IJ SOLN
INTRAMUSCULAR | Status: AC
  Filled 2015-09-29: qty 4

## 2015-09-29 MED ORDER — MIDAZOLAM HCL 2 MG/2ML IJ SOLN
INTRAMUSCULAR | Status: DC | PRN
  Administered 2015-09-29 (×2): 1 mg via INTRAVENOUS

## 2015-09-29 MED ORDER — FENTANYL CITRATE (PF) 100 MCG/2ML IJ SOLN
INTRAMUSCULAR | Status: DC | PRN
  Administered 2015-09-29 (×3): 50 ug via INTRAVENOUS
  Administered 2015-09-29: 25 ug via INTRAVENOUS

## 2015-09-29 MED ORDER — HEPARIN SODIUM (PORCINE) 10000 UNIT/ML IJ SOLN
INTRAMUSCULAR | Status: AC
  Filled 2015-09-29: qty 1

## 2015-09-29 MED ORDER — ALTEPLASE 2 MG IJ SOLR
INTRAMUSCULAR | Status: AC
  Filled 2015-09-29: qty 2

## 2015-09-29 MED ORDER — HEPARIN (PORCINE) IN NACL 2-0.9 UNIT/ML-% IJ SOLN
INTRAMUSCULAR | Status: AC
  Filled 2015-09-29: qty 500

## 2015-09-29 MED ORDER — LIDOCAINE-EPINEPHRINE (PF) 1 %-1:200000 IJ SOLN
INTRAMUSCULAR | Status: AC
  Filled 2015-09-29: qty 30

## 2015-09-29 SURGICAL SUPPLY — 9 items
BIOPATCH RED 1 DISK 7.0 (GAUZE/BANDAGES/DRESSINGS) ×2 IMPLANT
BIOPATCH RED 1IN DISK 7.0MM (GAUZE/BANDAGES/DRESSINGS) ×1
CATH PALINDROME RT-P 15FX23CM (CATHETERS) ×3 IMPLANT
DRAPE INCISE IOBAN 66X45 STRL (DRAPES) ×3 IMPLANT
PACK ANGIOGRAPHY (CUSTOM PROCEDURE TRAY) ×3 IMPLANT
SET INTRO CAPELLA COAXIAL (SET/KITS/TRAYS/PACK) ×3 IMPLANT
SUT MNCRL AB 4-0 PS2 18 (SUTURE) ×3 IMPLANT
SUT SILK 0 FSL (SUTURE) ×3 IMPLANT
TOWEL OR 17X26 4PK STRL BLUE (TOWEL DISPOSABLE) ×3 IMPLANT

## 2015-09-29 NOTE — Progress Notes (Signed)
[place] [facility] [place] NAME: Ronnie Keith    MR#:  [id number]  DATE OF BIRTH:  11-20-1960 For permacath placement. Today. Resting quietly.  REVIEW OF SYSTEMS:   Review of Systems  Constitutional: Negative for fever, chills and weight loss.  HENT: Negative for ear discharge, ear pain and nosebleeds.   Eyes: Negative for blurred vision, pain and discharge.  Respiratory: Negative for sputum production, shortness of breath, wheezing and stridor.   Cardiovascular: Positive for leg swelling. Negative for chest pain, palpitations, orthopnea and PND.  Gastrointestinal: Negative for nausea, vomiting and diarrhea.  Genitourinary: Negative for urgency and frequency.  Musculoskeletal: Negative for back pain and joint pain.  Neurological: Negative for sensory change, speech change, focal weakness and weakness.  Psychiatric/Behavioral: Negative for depression and hallucinations. The patient is not nervous/anxious.    Tolerating Diet:yes Tolerating PT: not needed  DRUG ALLERGIES:   Allergies  Allergen Reactions  . Heparin     Thrombocytopenia.  HIT panel positive, awaiting SRA results.    VITALS:  Blood pressure 121/72, pulse 72, temperature 97.6 F (36.4 C), temperature source Oral, resp. rate 16, height 6' (1.829 m), weight 117.6 kg (259 lb 4.2 oz), SpO2 100 %. PHYSICAL EXAMINATION:   Physical Exam  GENERAL:  55 year-old patient lying in the bed with no acute distress.  EYES: Pupils equal, round, reactive to light and accommodation. No scleral icterus. Extraocular muscles intact.  HEENT: Head atraumatic, normocephalic. Oropharynx and nasopharynx clear.  NECK:  Supple, no jugular venous distention. No thyroid enlargement, no tenderness.  LUNGS: Normal breath sounds bilaterally, no wheezing, rales, rhonchi. No use of accessory muscles of respiration.  CARDIOVASCULAR: S1, S2 normal. No murmurs, rubs, or gallops.  ABDOMEN; bowel sounds  present,no rebound tenderness, bowel sounds presentS: No cyanosis, clubbing , ++ edema b/l.   NEUROLOGIC: Cranial nerves II through XII are intact. No focal Motor or sensory deficits b/l.   PSYCHIATRIC: The patient is alert and oriented x 3.  SKIN: No obvious rash, lesion, or ulcer.   LABORATORY PANEL:  CBC  Recent Labs Lab 09/28/15 0621  WBC 5.2  HGB 9.2*  HCT 27.7*  PLT 79*    Chemistries   Recent Labs Lab 09/25/15 1535  09/29/15 0921  NA 138  < > 139  K 4.1  < > 4.3  CL 101  < > 103  CO2 30  < > 28  GLUCOSE 117*  < > 86  BUN 24*  < > 46*  CREATININE 3.39*  < > 5.52*  CALCIUM 7.8*  < > 8.1*  AST 13*  --   --   ALT 16*  --   --   ALKPHOS 107  --   --   BILITOT 0.8  --   --   < > = values in this interval not displayed.  Cardiac Enzymes  Recent Labs Lab 09/28/15 0621  TROPONINI 0.04*   RADIOLOGY:  No results found. ASSESSMENT AND PLAN:   55 year old Caucasian gentleman history of essential hypertension is presenting with elevated blood pressure.  1. R arm thrombophlebitis: antecub pustule seen at old IV site  ;continue  the IV vancomycin as per ID recommendation. 2. Hypertensive urgency: Continue Coreg, clonidine and hydralazine - blood pressures improved  3. End-stage renal disease now on hemodialysis -Tolerated first treatment -s/p Right IJ temp cath placement  09/23/15 by Dr schnier For Permacath placement today;use xanax for agitation   4. Hyperkalemia: Received Kayexalate potassium  improved -Resolved -off telemetry  5.abdominal pain likely due to constipation: Continue PPIs, stool softeners.  6.PAssive suicidal ideation;: Likely due to anxiety, possible adjustment.disorder; According to  Aunt ,he is never been  Seen by psych,added Remeron..  * Venous thromboembolism prophylactic: Heparin subcutaneous   Case discussed with Care Management/Social Worker. Management plans discussed with the patient, family and they are in agreement.  CODE  STATUS: full  DVT Prophylaxis: heparin  TOTAL TIME TAKING CARE OF THIS PATIENT: 30 minutes.     POSSIBLE D/C IN 2-3 DAYS, DEPENDING ON CLINICAL CONDITION.- needs permacath  Note: This dictation was prepared with Dragon dictation along with smaller phrase technology. Any transcriptional errors that result from this process are unintentional.  Akanksha Bellmore M.D on 09/29/2015 at 1:12 PM  Between 7am to 6pm - Pager - [phone]  After 6pm go to [email or web address] - password [email or web address] [facility]  [facility]  Office  [phone]  CC: Primary care physician; No primary care provider on file.

## 2015-09-29 NOTE — Progress Notes (Signed)
ANTIBIOTIC CONSULT NOTE - Follow Up  Pharmacy Consult for Cefazolin Indication: Wound infection  No Known Allergies  Patient Measurements: Height: 6' (182.9 cm) Weight: 259 lb 4.2 oz (117.6 kg) IBW/kg (Calculated) : 77.6  Vital Signs: Temp: 97.6 F (36.4 C) (01/27 0606) Temp Source: Oral (01/27 0606) BP: 121/72 mmHg (01/27 0606) Pulse Rate: 72 (01/27 0606) Intake/Output from previous day: 01/26 0701 - 01/27 0700 In: 465 [P.O.:420; IV Piggyback:45] Out: 650 [Urine:650] Intake/Output from this shift:    Labs:  Recent Labs  09/27/15 1100 09/28/15 0621 09/29/15 0921  WBC 5.0 5.2  --   HGB 10.0* 9.2*  --   PLT 79* 79*  --   CREATININE 5.38*  --  5.52*   Estimated Creatinine Clearance: 20.7 mL/min (by C-G formula based on Cr of 5.52). No results for input(s): VANCOTROUGH, VANCOPEAK, VANCORANDOM, GENTTROUGH, GENTPEAK, GENTRANDOM, TOBRATROUGH, TOBRAPEAK, TOBRARND, AMIKACINPEAK, AMIKACINTROU, AMIKACIN in the last 72 hours.   Microbiology: Recent Results (from the past 720 hour(s))  CULTURE, BLOOD (ROUTINE X 2) w Reflex to PCR ID Panel     Status: None (Preliminary result)   Collection Time: 09/25/15 11:08 AM  Result Value Ref Range Status   Specimen Description BLOOD LEFT HAND  Final   Special Requests BOTTLES DRAWN AEROBIC AND ANAEROBIC [place]  Final   Culture NO GROWTH 4 DAYS  Final   Report Status PENDING  Incomplete  CULTURE, BLOOD (ROUTINE X 2) w Reflex to PCR ID Panel     Status: None (Preliminary result)   Collection Time: 09/25/15  3:41 PM  Result Value Ref Range Status   Specimen Description BLOOD LINE  Final   Special Requests   Final    BOTTLES DRAWN AEROBIC AND ANAEROBIC AERO 8CC ANA 14CC   Culture NO GROWTH 4 DAYS  Final   Report Status PENDING  Incomplete  Wound culture     Status: None   Collection Time: 09/26/15  3:02 PM  Result Value Ref Range Status   Specimen Description WOUND  Final   Special Requests Normal  Final   Gram Stain RARE WBC  SEEN NO ORGANISMS SEEN   Final   Culture LIGHT GROWTH STAPHYLOCOCCUS AUREUS  Final   Report Status 09/29/2015 FINAL  Final   Organism ID, Bacteria STAPHYLOCOCCUS AUREUS  Final      Susceptibility   Staphylococcus aureus - MIC*    OXACILLIN Value in next row Sensitive      SENSITIVE<=0.25    GENTAMICIN Value in next row Sensitive      SENSITIVE<=0.5    CIPROFLOXACIN Value in next row Sensitive      SENSITIVE<=0.5    ERYTHROMYCIN Value in next row Sensitive      SENSITIVE<=0.25    CLINDAMYCIN Value in next row Sensitive      SENSITIVE<=0.25    VANCOMYCIN Value in next row Sensitive      SENSITIVE1    TETRACYCLINE Value in next row Sensitive      SENSITIVE<=1    RIFAMPIN Value in next row Sensitive      SENSITIVE<=0.5    TRIMETH/SULFA Value in next row Sensitive      SENSITIVE<=10    * LIGHT GROWTH STAPHYLOCOCCUS AUREUS    Medical History: Past Medical History  Diagnosis Date  . Hypertension   . Chronic low back pain   . Scoliosis   . Anxiety   . Sciatica     Medications:  Scheduled:  . aspirin EC  81 mg Oral Daily  .  carvedilol  12.5 mg Oral BID WC  .  ceFAZolin (ANCEF) IV  1 g Intravenous Q24H  . Chlorhexidine Gluconate Cloth  6 each Topical Once  . cloNIDine  0.1 mg Oral BID  . hydrALAZINE  25 mg Oral 3 times per day  . mirtazapine  15 mg Oral QHS  . senna-docusate  1 tablet Oral BID   Assessment: Pharmacy consulted to dose Cefazolin in a 55 yo male with right arm thrombophlebitis.  Patient with ESRD requiring HD.  Wound Cultures growing oxacillin sensitive staph aureus.  Patient has received IV Vancomycin therapy for 3 days.  MD desires to narrow to Cefazolin.    Goal of Therapy:  Resolution of infection  Plan:  Will transition patient to Cefazolin 1 gm IV q24h with doses scheduled to be given after HD on HD days.   Pharmacy will continue to follow.    Seleena Reimers G 09/29/2015,2:49 PM

## 2015-09-29 NOTE — Progress Notes (Signed)
[place] INFECTIOUS DISEASE PROGRESS NOTE Date of Admission:  09/19/2015     ID: Ronnie Keith is a 55 y.o. male with fever, new onset ESRD on HD  Principal Problem:   Depression, major, recurrent, moderate ([place]) Active Problems:   Hypertensive urgency   Acute kidney injury ([place])   Hyperkalemia   Adjustment disorder with mixed anxiety and depressed mood   Subjective: No further fevers.  R arm site less red  ROS  Eleven systems are reviewed and negative except per hpi  Medications:  Antibiotics Given (last 72 hours)    Date/Time Action Medication Dose Rate   09/26/15 1654 Given  [just received from pharmacy]   vancomycin (VANCOCIN) 2,000 mg in sodium chloride 0.9 % 500 mL IVPB 2,000 mg 250 mL/hr   09/27/15 1240 Given  [Given last hour of hemodialysis treatment.]   vancomycin (VANCOCIN) IVPB 1000 mg/200 mL premix 1,000 mg 200 mL/hr   09/28/15 1821 Given   ceFAZolin (ANCEF) IVPB 1 g/50 mL premix 1 g 100 mL/hr     . aspirin EC  81 mg Oral Daily  . carvedilol  12.5 mg Oral BID WC  .  ceFAZolin (ANCEF) IV  1 g Intravenous Q24H  . Chlorhexidine Gluconate Cloth  6 each Topical Once  . cloNIDine  0.1 mg Oral BID  . hydrALAZINE  25 mg Oral 3 times per day  . midazolam  10 mg Oral Once  . mirtazapine  15 mg Oral QHS  . senna-docusate  1 tablet Oral BID    Objective: Vital signs in last 24 hours: Temp:  [97.6 F (36.4 C)] 97.6 F (36.4 C) (01/27 0606) Pulse Rate:  [72-78] 72 (01/27 0606) Resp:  [16] 16 (01/27 0606) BP: (102-121)/(62-72) 121/72 mmHg (01/27 0606) SpO2:  [89 %-100 %] 100 % (01/27 0606) Constitutional: He is alert, some flat affect, minimal interaction. . He appears obese, stittin gup on side of bedHENT: PERRLA,  Mouth/Throat: Oropharynx is clear and dry . No oropharyngeal exudate.  Cardiovascular: Normal rate, regular rhythm and normal heart sounds.  R neck IJ line in placed Pulmonary/Chest: Effort normal and breath sounds normal. No respiratory  distress. He has no wheezes.  Abdominal: obese, distended, mild ttp diffusely, esp RUQ . Bowel sounds are normal. Lymphadenopathy: He has no cervical adenopathy.  Neurological: He is alert and oriented to person, place, and time.  Skin: Skin is warm and dry. R antecub has much less redness and induration but still somewhat firm .  Psychiatric: He has a flat affect  Lab Results  Recent Labs  09/27/15 1100 09/28/15 0621 09/29/15 0921  WBC 5.0 5.2  --   HGB 10.0* 9.2*  --   HCT 30.3* 27.7*  --   NA 136  --  139  K 4.2  --  4.3  CL 101  --  103  CO2 24  --  28  BUN 41*  --  46*  CREATININE 5.38*  --  5.52*    Microbiology: Results for orders placed or performed during the hospital encounter of 09/19/15  CULTURE, BLOOD (ROUTINE X 2) w Reflex to PCR ID Panel     Status: None (Preliminary result)   Collection Time: 09/25/15 11:08 AM  Result Value Ref Range Status   Specimen Description BLOOD LEFT HAND  Final   Special Requests BOTTLES DRAWN AEROBIC AND ANAEROBIC [place]  Final   Culture NO GROWTH 4 DAYS  Final   Report Status PENDING  Incomplete  CULTURE, BLOOD (ROUTINE  X 2) w Reflex to PCR ID Panel     Status: None (Preliminary result)   Collection Time: 09/25/15  3:41 PM  Result Value Ref Range Status   Specimen Description BLOOD LINE  Final   Special Requests   Final    BOTTLES DRAWN AEROBIC AND ANAEROBIC AERO 8CC ANA 14CC   Culture NO GROWTH 4 DAYS  Final   Report Status PENDING  Incomplete  Wound culture     Status: None   Collection Time: 09/26/15  3:02 PM  Result Value Ref Range Status   Specimen Description WOUND  Final   Special Requests Normal  Final   Gram Stain RARE WBC SEEN NO ORGANISMS SEEN   Final   Culture LIGHT GROWTH STAPHYLOCOCCUS AUREUS  Final   Report Status 09/29/2015 FINAL  Final   Organism ID, Bacteria STAPHYLOCOCCUS AUREUS  Final      Susceptibility   Staphylococcus aureus - MIC*    OXACILLIN Value in next row Sensitive       SENSITIVE<=0.25    GENTAMICIN Value in next row Sensitive      SENSITIVE<=0.5    CIPROFLOXACIN Value in next row Sensitive      SENSITIVE<=0.5    ERYTHROMYCIN Value in next row Sensitive      SENSITIVE<=0.25    CLINDAMYCIN Value in next row Sensitive      SENSITIVE<=0.25    VANCOMYCIN Value in next row Sensitive      SENSITIVE1    TETRACYCLINE Value in next row Sensitive      SENSITIVE<=1    RIFAMPIN Value in next row Sensitive      SENSITIVE<=0.5    TRIMETH/SULFA Value in next row Sensitive      SENSITIVE<=10    * LIGHT GROWTH STAPHYLOCOCCUS AUREUS   Studies/Results: Ct Abdomen Pelvis Wo Contrast  09/21/2015  CLINICAL DATA:  Hydronephrosis. Hypertension. Chronic low back pain. Scoliosis. Stage 4 chronic kidney disease. EXAM: CT ABDOMEN AND PELVIS WITHOUT CONTRAST TECHNIQUE: Multidetector CT imaging of the abdomen and pelvis was performed following the standard protocol without IV contrast. COMPARISON:  09/21/2015 FINDINGS: Lower chest: Mild airway thickening in both lower lobes. Mild cardiomegaly. Hepatobiliary: Unremarkable Pancreas: Unremarkable Spleen: Unremarkable Adrenals/Urinary Tract: Adrenal glands normal. Bilateral renal atrophy. Several small marginal cysts are present in the kidneys. No hydronephrosis or hydroureter. No stones identified. Stomach/Bowel: Orally administered contrast extends through to the colon. Frothy air fluid level in the rectum suggesting diarrheal process. Vascular/Lymphatic: Upper normal sized peripancreatic and porta hepatis lymph nodes. No significant atherosclerotic calcification in the abdomen or pelvis. Reproductive: Scattered prostate gland calcifications primarily in the central zone but potentially also in the left peripheral zone. Other: No supplemental non-categorized findings. Musculoskeletal: Fatty left spermatic cord. IMPRESSION: 1. No adrenal mass. No significant atherosclerosis to suggest renal artery stenosis. 2. Several small suspected cysts of  the kidneys. Renal atrophy. No hydronephrosis currently. No hydroureter. 3. Airway thickening is present, suggesting bronchitis or reactive airways disease. 4. Mild cardiomegaly. 5. Scattered prostate gland calcifications. Electronically Signed   By: Van Clines M.D.   On: 09/21/2015 17:09   Dg Chest 1 View  09/23/2015  CLINICAL DATA:  Central line placement EXAM: CHEST 1 VIEW COMPARISON:  None. FINDINGS: Cardiomegaly is noted. Mild basilar atelectasis. Mild elevation of the right hemidiaphragm. No segmental infiltrate or pulmonary edema. There is right IJ central line with tip in SVC right atrium junction. No pneumothorax. Mild mid mediastinal prominence. Adenopathy cannot be excluded. IMPRESSION: Right IJ central line with tip  in SVC right atrium junction. No pneumothorax. Cardiomegaly again noted. Mild mediastinal prominence. Although this may be vascular in nature adenopathy cannot be excluded. Electronically Signed   By: Lahoma Crocker M.D.   On: 09/23/2015 16:03   Dg Chest 2 View  09/25/2015  CLINICAL DATA:  Fever.  Hypertension and back pain. EXAM: CHEST  2 VIEW COMPARISON:  09/23/2015 FINDINGS: There is a right IJ catheter with tip in the projection of the right atrium. Heart size is moderately enlarged. No pleural effusion or edema. Asymmetric elevation of the right hemidiaphragm is identified. IMPRESSION: 1. No acute findings. 2. Cardiac enlargement and asymmetric elevation of right hemidiaphragm. Electronically Signed   By: Kerby Moors M.D.   On: 09/25/2015 16:46   Ct Head Wo Contrast  09/21/2015  CLINICAL DATA:  Change in mental status. EXAM: CT HEAD WITHOUT CONTRAST TECHNIQUE: Contiguous axial images were obtained from the base of the skull through the vertex without intravenous contrast. COMPARISON:  None. FINDINGS: There is no intra or extra-axial fluid collection or mass lesion. The basilar cisterns and ventricles have a normal appearance. There is no CT evidence for acute infarction  or hemorrhage. Bone windows are unremarkable. The paranasal and mastoid air cells are normally aerated. IMPRESSION: No evidence for acute intracranial abnormality. Electronically Signed   By: Nolon Nations M.D.   On: 09/21/2015 13:17   US Renal  09/21/2015  CLINICAL DATA:  Acute renal failure. EXAM: RENAL / URINARY TRACT ULTRASOUND COMPLETE COMPARISON:  None. FINDINGS: Right Kidney: Length: 8.3 cm. Right kidney is echogenic consistent chronic medical renal disease. Mild moderate hydronephrosis. 1.0 cm simple cyst upper pole. Left Kidney: Length: 9.7 cm. Left kidney is echogenic consistent chronic medical renal disease. Mild hydronephrosis. 1.7 cm simple cyst lower pole. Bladder: Appears normal for degree of bladder distention. Prevoid volume 319 cc, postvoid volume 19 cc. Hydronephrosis does not change following voiding. IMPRESSION: Kidneys are echogenic bilaterally consistent chronic medical renal disease. Mild bilateral hydronephrosis. No bladder distention. Electronically Signed   By: Marcello Moores  Register   On: 09/21/2015 12:28   Dg Abd 2 Views  09/21/2015  CLINICAL DATA:  Patient complaining of abdominal pain. History of hypertension and anxiety. No chest pain or sob. EXAM: ABDOMEN - 2 VIEW COMPARISON:  None. FINDINGS: There is no free intraperitoneal air beneath the diaphragm. Supine and erect views of the abdomen demonstrate air-fluid levels in loops of ascending large bowel which is a nonspecific appearance. No evidence for bowel dilatation. Scoliosis. No organomegaly. Prostatic calcifications noted. IMPRESSION: 1. No evidence for bowel obstruction. 2. No free intraperitoneal air. Electronically Signed   By: Nolon Nations M.D.   On: 09/21/2015 14:12     Assessment/Plan: Ronnie Keith is a 55 y.o. male with Htn, CKD now on HD with fevers on day 6 of admission. [place]. WBC nml. CXR neg. UA on admit neg for infection, does not currently have a foley.  HIV and Flu negative He has R antecub  infection at old IV site with induration.  Cx is Methicillin Staph aureus Clinically improving but still some induration at site.  Would continue ancef until 2/2 - can extend if site not fully healed by then I will sign off. Please call with questions. Thank you very much for the consult.   [place], [place]   09/29/2015, 2:22 PM

## 2015-09-29 NOTE — Consult Note (Signed)
  Psychiatry: Came by to try and reevaluate the patient today but he appears to be out of his room getting his catheter placed most of the day. I will continue to follow up over the weekend.

## 2015-09-29 NOTE — Progress Notes (Signed)
1540 - Zinacef 1.5g given with no adverse reactions noted.

## 2015-09-29 NOTE — Progress Notes (Signed)
Observed areas on lower right posterior of head and upper left posterior head slight swelling. Notified Dr Marcille Blanco. VSS  Pupils reactive. No new orders. Will continue to monitor. Notified daytime nurse, staff will continue to monitor.

## 2015-09-29 NOTE — Progress Notes (Signed)
At 0135 patient awoke, got out of bed,walked unsteadily at a fast pace to BR, urinated on floor, sat on toilet still urinating on floor in front of toilet. As staff placed towels so pt would not slip, pt started to stand up and as staff said "Sit so you won't slip" pt became angry. He threw himself backwards and his back hit the metal part at the back of commode, his head hit the metal also. The staff said "Sir, please don't do that" and pt purposely did it again. Another staff member ran to try to help pt from repeating it but patient did it one more time with staff trying to stop him. Called more staff for help, able to calm patient down, help him walk back to bed, clean him up, get new gown on. VSS. Pt has two reddish marks on his back. No bleeding on head. Pupils reactive. Spoke with Dr Lavetta Nielsen concerning this episode. No new orders.

## 2015-09-29 NOTE — Progress Notes (Signed)
[facility]  ROUNDING NOTE   Subjective:   Tunneled catheter placement for later today.  Given midazolam for anxiety  Objective:  Vital signs in last 24 hours:  Temp:  [97.6 F (36.4 C)] 97.6 F (36.4 C) (01/27 0606) Pulse Rate:  [72-84] 72 (01/27 0606) Resp:  [16] 16 (01/27 0606) BP: (102-121)/(40-72) 121/72 mmHg (01/27 0606) SpO2:  [89 %-100 %] 100 % (01/27 0606)  Weight change:  Filed Weights   09/27/15 1000 09/27/15 1312 09/28/15 0607  Weight: 118.9 kg (262 lb 2 oz) 117.4 kg (258 lb 13.1 oz) 117.6 kg (259 lb 4.2 oz)    Intake/Output: I/O last 3 completed shifts: In: 705 [P.O.:660; IV Piggyback:45] Out: [id number] [Urine:1225]   Intake/Output this shift:     Physical Exam: General: NAD, laying in bed.   Head: Normocephalic, atraumatic. Moist oral mucosal membranes  Eyes: Anicteric, PERRL  Neck: Supple, trachea midline  Lungs:  Clear to auscultation normal effort  Heart: Regular rate and rhythm  Abdomen:  Soft, nontender, BS present  Extremities: 1+ peripheral edema.  Neurologic: Nonfocal, moving all four extremities  Skin: No lesions  Access: RIJ temp HD catheter 09/23/2015 Dr. Delana Meyer    Basic Metabolic Panel:  Recent Labs Lab 09/23/15 2051 09/24/15 1929 09/25/15 0938 09/25/15 1535 09/27/15 1100 09/29/15 0921  NA 136 137 139 138 136 139  K 5.3* 4.7 4.6 4.1 4.2 4.3  CL 114* 110 104 101 101 103  CO2 19* 22 27 30 24 28   GLUCOSE 116* 140* 102* 117* 111* 86  BUN 70* 56* 38* 24* 41* 46*  CREATININE 5.15* 4.91* 4.08* 3.39* 5.38* 5.52*  CALCIUM 7.2* 7.6* 7.9* 7.8* 7.9* 8.1*  PHOS 5.2* 4.2 4.0  --  3.1 4.1    Liver Function Tests:  Recent Labs Lab 09/24/15 1929 09/25/15 0938 09/25/15 1535 09/27/15 1100 09/29/15 0921  AST  --   --  13*  --   --   ALT  --   --  16*  --   --   ALKPHOS  --   --  107  --   --   BILITOT  --   --  0.8  --   --   PROT  --   --  5.9*  --   --   ALBUMIN 3.4* 3.2* 3.1* 2.9* 2.8*   No results for input(s):  LIPASE, AMYLASE in the last 168 hours. No results for input(s): AMMONIA in the last 168 hours.  CBC:  Recent Labs Lab 09/24/15 1929 09/25/15 0938 09/26/15 0947 09/27/15 1100 09/28/15 0621  WBC 5.1 5.2 3.1* 5.0 5.2  HGB 9.8* 9.9* 9.4* 10.0* 9.2*  HCT 29.9* 29.6* 28.9* 30.3* 27.7*  MCV 90.0 89.1 88.9 90.3 89.9  PLT 86* 84* 76* 79* 79*    Cardiac Enzymes:  Recent Labs Lab 09/28/15 0621  TROPONINI 0.04*    BNP: Invalid input(s): POCBNP  CBG: No results for input(s): GLUCAP in the last 168 hours.  Microbiology: Results for orders placed or performed during the hospital encounter of 09/19/15  CULTURE, BLOOD (ROUTINE X 2) w Reflex to PCR ID Panel     Status: None (Preliminary result)   Collection Time: 09/25/15 11:08 AM  Result Value Ref Range Status   Specimen Description BLOOD LEFT HAND  Final   Special Requests BOTTLES DRAWN AEROBIC AND ANAEROBIC [place]  Final   Culture NO GROWTH 4 DAYS  Final   Report Status PENDING  Incomplete  CULTURE, BLOOD (ROUTINE X 2) w  Reflex to PCR ID Panel     Status: None (Preliminary result)   Collection Time: 09/25/15  3:41 PM  Result Value Ref Range Status   Specimen Description BLOOD LINE  Final   Special Requests   Final    BOTTLES DRAWN AEROBIC AND ANAEROBIC AERO 8CC ANA 14CC   Culture NO GROWTH 4 DAYS  Final   Report Status PENDING  Incomplete  Wound culture     Status: None   Collection Time: 09/26/15  3:02 PM  Result Value Ref Range Status   Specimen Description WOUND  Final   Special Requests Normal  Final   Gram Stain RARE WBC SEEN NO ORGANISMS SEEN   Final   Culture LIGHT GROWTH STAPHYLOCOCCUS AUREUS  Final   Report Status 09/29/2015 FINAL  Final   Organism ID, Bacteria STAPHYLOCOCCUS AUREUS  Final      Susceptibility   Staphylococcus aureus - MIC*    OXACILLIN Value in next row Sensitive      SENSITIVE<=0.25    GENTAMICIN Value in next row Sensitive      SENSITIVE<=0.5    CIPROFLOXACIN Value in next row  Sensitive      SENSITIVE<=0.5    ERYTHROMYCIN Value in next row Sensitive      SENSITIVE<=0.25    CLINDAMYCIN Value in next row Sensitive      SENSITIVE<=0.25    VANCOMYCIN Value in next row Sensitive      SENSITIVE1    TETRACYCLINE Value in next row Sensitive      SENSITIVE<=1    RIFAMPIN Value in next row Sensitive      SENSITIVE<=0.5    TRIMETH/SULFA Value in next row Sensitive      SENSITIVE<=10    * LIGHT GROWTH STAPHYLOCOCCUS AUREUS    Coagulation Studies: No results for input(s): LABPROT, INR in the last 72 hours.  Urinalysis: No results for input(s): COLORURINE, LABSPEC, PHURINE, GLUCOSEU, HGBUR, BILIRUBINUR, KETONESUR, PROTEINUR, UROBILINOGEN, NITRITE, LEUKOCYTESUR in the last 72 hours.  Invalid input(s): APPERANCEUR    Imaging: No results found.   Medications:   . sodium chloride     . aspirin EC  81 mg Oral Daily  . carvedilol  12.5 mg Oral BID WC  .  ceFAZolin (ANCEF) IV  1 g Intravenous Q24H  . Chlorhexidine Gluconate Cloth  6 each Topical Once  . cloNIDine  0.1 mg Oral BID  . hydrALAZINE  25 mg Oral 3 times per day  . midazolam  10 mg Oral Once  . mirtazapine  15 mg Oral QHS  . senna-docusate  1 tablet Oral BID   acetaminophen **OR** acetaminophen, ALPRAZolam, alteplase, hydrALAZINE, lidocaine (PF), lidocaine-prilocaine, morphine injection, nitroGLYCERIN, ondansetron **OR** ondansetron (ZOFRAN) IV, oxyCODONE, pentafluoroprop-tetrafluoroeth, sodium chloride  Assessment/ Plan:  55 y.o.white male  with hypertension, chronic low back pain, scoliosis, anxiety, CKD stage IV Baseline Cr 3.4 who was admitted to [facility] 09/19/2015 for evaluation of uncontrolled hypertension.   1. End Stage Renal Disease: completed Four treatments of hemodialysis.  Baseline creatinine 3.4 on 12/28/14.  Etiology of ESRD unclear but most likely secondary to uncontrolled HTN and subsequent focal glomeruloscerlosis with significant proteinuria. Serologic work up negative.   -Patient  had first hemodialysis treatment 09/23/15. - Will need tunneled permcath placement before discharge, discussed this with length with patient and family  - Next treatment tentatively for later today. Orders prepared.   2. Anemia chronic kidney disease. Hgb 9.2 - epo with dialysis treatment.   3. Secondary hyperparathyroidism. PTH 411, phos 4.   -  not currently on a binder or vitamin D agent.   4. Hypertension.  Blood pressure at goal. -  continue Coreg, clonidine, and hydralazine   LOS: [place], Zulay Corrie 1/27/201712:27 PM

## 2015-09-30 LAB — CULTURE, BLOOD (ROUTINE X 2)
CULTURE: NO GROWTH
Culture: NO GROWTH

## 2015-09-30 NOTE — Progress Notes (Signed)
Ronnie Keith at [place] NAME: Ronnie Keith    MR#:  [id number]  DATE OF BIRTH:  1961/03/25 had permacath placement yesterday. He is sleepy this morning,,I think its because of remeron. He did not get any xanax since last evening.  REVIEW OF SYSTEMS:   Review of Systems  Constitutional: Negative for fever, chills and weight loss.  HENT: Negative for ear discharge, ear pain and nosebleeds.   Eyes: Negative for blurred vision, pain and discharge.  Respiratory: Negative for sputum production, shortness of breath, wheezing and stridor.   Cardiovascular: Positive for leg swelling. Negative for chest pain, palpitations, orthopnea and PND.  Gastrointestinal: Negative for nausea, vomiting and diarrhea.  Genitourinary: Negative for urgency and frequency.  Musculoskeletal: Negative for back pain and joint pain.  Neurological: Negative for sensory change, speech change, focal weakness and weakness.  Psychiatric/Behavioral: Negative for depression and hallucinations. The patient is not nervous/anxious.    Tolerating Diet:yes Tolerating PT: not needed  DRUG ALLERGIES:   No Known Allergies  VITALS:  Blood pressure 112/69, pulse 78, temperature 97.5 F (36.4 C), temperature source Oral, resp. rate 12, height 6' (1.829 m), weight 115.4 kg (254 lb 6.6 oz), SpO2 91 %. PHYSICAL EXAMINATION:   Physical Exam  GENERAL:  55 y.o.-year-old patient lying in the bed with no acute distress.  EYES: Pupils equal, round, reactive to light and accommodation. No scleral icterus. Extraocular muscles intact.  HEENT: Head atraumatic, normocephalic. Oropharynx and nasopharynx clear.  NECK:  Supple, no jugular venous distention. No thyroid enlargement, no tenderness.  LUNGS: Normal breath sounds bilaterally, no wheezing, rales, rhonchi. No use of accessory muscles of respiration.  CARDIOVASCULAR: S1, S2 normal. No murmurs, rubs, or gallops.  ABDOMEN; bowel sounds  present,no rebound tenderness, bowel sounds presentS: No cyanosis, clubbing , ++ edema b/l.   NEUROLOGIC: Cranial nerves II through XII are intact. No focal Motor or sensory deficits b/l.   PSYCHIATRIC: The patient is alert and oriented x 3.  SKIN: No obvious rash, lesion, or ulcer.   LABORATORY PANEL:  CBC  Recent Labs Lab 09/29/15 1755  WBC 5.5  HGB 9.3*  HCT 28.3*  PLT 100*    Chemistries   Recent Labs Lab 09/25/15 1535  09/29/15 0921  NA 138  < > 139  K 4.1  < > 4.3  CL 101  < > 103  CO2 30  < > 28  GLUCOSE 117*  < > 86  BUN 24*  < > 46*  CREATININE 3.39*  < > 5.52*  CALCIUM 7.8*  < > 8.1*  AST 13*  --   --   ALT 16*  --   --   ALKPHOS 107  --   --   BILITOT 0.8  --   --   < > = values in this interval not displayed.  Cardiac Enzymes  Recent Labs Lab 09/28/15 0621  TROPONINI 0.04*   RADIOLOGY:  No results found. ASSESSMENT AND PLAN:   55 year old Caucasian gentleman history of essential hypertension is presenting with elevated blood pressure.  1. R arm thrombophlebitis: antecub pustule seen at old IV site  Blood cultures are  negative to date.had chest x-ray negative. Patient IV site  is still indurated. He has MSSA. Continue IV Ancef to February 2. Seen by ID  2. Hypertensive urgency: Continue Coreg, clonidine and hydralazine - blood pressures improved  3. End-stage renal disease now on hemodialysis; status permacath placement.remove Right IJ<>. Spoke with Dr.  Kolluru, Patient has no insurance, waiting for insurance approval for out  patient dialysis -  4. Hyperkalemia: Received Kayexalate potassium improved -Resolved -off telemetry  5.abdominal pain likely due to constipation: Continue PPIs, stool softeners.  6.depression: Started on Remeron but he is very sedated ,missing meals also.appreciate psych following and changing to different  Med.   * Venous thromboembolism prophylactic: Heparin subcutaneous   Case discussed with Care  Management/Social Worker. Management plans discussed with the patient, family and they are in agreement.  CODE STATUS: full  DVT Prophylaxis: heparin  TOTAL TIME TAKING CARE OF THIS PATIENT: 30 minutes.     POSSIBLE D/C IN 2-3 DAYS, DEPENDING ON CLINICAL CONDITION.- needs permacath  Note: This dictation was prepared with Dragon dictation along with smaller phrase technology. Any transcriptional errors that result from this process are unintentional.  Amariona Rathje M.D on 09/30/2015 at 10:43 AM  Between 7am to 6pm - Pager - [phone]  After 6pm go to [email or web address] - password [email or web address] [facility]  [facility]  Office  [phone]  CC: Primary care physician; No primary care provider on file.

## 2015-09-30 NOTE — Progress Notes (Signed)
[facility]  ROUNDING NOTE   Subjective:   Lethargic this morning. Tunelled catheter placed yesterday. Hemodialysis yesterday evening. UF 1.5. Tolerating procedure well. Given lorazepam during dialysis.    Objective:  Vital signs in last 24 hours:  Temp:  [97.5 F (36.4 C)-97.9 F (36.6 C)] 97.5 F (36.4 C) (01/28 0536) Pulse Rate:  [75-86] 78 (01/28 1002) Resp:  [8-20] 12 (01/28 0957) BP: (103-141)/(59-80) 112/69 mmHg (01/28 0957) SpO2:  [89 %-92 %] 91 % (01/28 1002) Weight:  [115.4 kg (254 lb 6.6 oz)-117.6 kg (259 lb 4.2 oz)] 115.4 kg (254 lb 6.6 oz) (01/27 2052)  Weight change:  Filed Weights   09/28/15 0607 09/29/15 1750 09/29/15 2052  Weight: 117.6 kg (259 lb 4.2 oz) 117.6 kg (259 lb 4.2 oz) 115.4 kg (254 lb 6.6 oz)    Intake/Output: I/O last 3 completed shifts: In: 47 [P.O.:300; IV Piggyback:45] Out: 2100 [Urine:600; Other:1500]   Intake/Output this shift:  Total I/O In: -  Out: 250 [Urine:250]  Physical Exam: General: NAD, laying in bed.   Head: Normocephalic, atraumatic. Moist oral mucosal membranes  Eyes: Anicteric, PERRL  Neck: Supple, trachea midline  Lungs:  Clear to auscultation normal effort  Heart: Regular rate and rhythm  Abdomen:  Soft, nontender, BS present  Extremities: 1+ peripheral edema.  Neurologic: Nonfocal, moving all four extremities  Skin: No lesions  Access: RIJ permcath 09/29/15 Dr. Delana Meyer    Basic Metabolic Panel:  Recent Labs Lab 09/23/15 2051 09/24/15 1929 09/25/15 0938 09/25/15 1535 09/27/15 1100 09/29/15 0921  NA 136 137 139 138 136 139  K 5.3* 4.7 4.6 4.1 4.2 4.3  CL 114* 110 104 101 101 103  CO2 19* 22 27 30 24 28   GLUCOSE 116* 140* 102* 117* 111* 86  BUN 70* 56* 38* 24* 41* 46*  CREATININE 5.15* 4.91* 4.08* 3.39* 5.38* 5.52*  CALCIUM 7.2* 7.6* 7.9* 7.8* 7.9* 8.1*  PHOS 5.2* 4.2 4.0  --  3.1 4.1    Liver Function Tests:  Recent Labs Lab 09/24/15 1929 09/25/15 0938 09/25/15 1535  09/27/15 1100 09/29/15 0921  AST  --   --  13*  --   --   ALT  --   --  16*  --   --   ALKPHOS  --   --  107  --   --   BILITOT  --   --  0.8  --   --   PROT  --   --  5.9*  --   --   ALBUMIN 3.4* 3.2* 3.1* 2.9* 2.8*   No results for input(s): LIPASE, AMYLASE in the last 168 hours. No results for input(s): AMMONIA in the last 168 hours.  CBC:  Recent Labs Lab 09/25/15 0938 09/26/15 0947 09/27/15 1100 09/28/15 0621 09/29/15 1755  WBC 5.2 3.1* 5.0 5.2 5.5  HGB 9.9* 9.4* 10.0* 9.2* 9.3*  HCT 29.6* 28.9* 30.3* 27.7* 28.3*  MCV 89.1 88.9 90.3 89.9 88.6  PLT 84* 76* 79* 79* 100*    Cardiac Enzymes:  Recent Labs Lab 09/28/15 0621  TROPONINI 0.04*    BNP: Invalid input(s): POCBNP  CBG: No results for input(s): GLUCAP in the last 168 hours.  Microbiology: Results for orders placed or performed during the hospital encounter of 09/19/15  CULTURE, BLOOD (ROUTINE X 2) w Reflex to PCR ID Panel     Status: None   Collection Time: 09/25/15 11:08 AM  Result Value Ref Range Status   Specimen Description BLOOD LEFT HAND  Final   Special Requests BOTTLES DRAWN AEROBIC AND ANAEROBIC [place]  Final   Culture NO GROWTH 5 DAYS  Final   Report Status 09/30/2015 FINAL  Final  CULTURE, BLOOD (ROUTINE X 2) w Reflex to PCR ID Panel     Status: None   Collection Time: 09/25/15  3:41 PM  Result Value Ref Range Status   Specimen Description BLOOD LINE  Final   Special Requests   Final    BOTTLES DRAWN AEROBIC AND ANAEROBIC AERO 8CC ANA 14CC   Culture NO GROWTH 5 DAYS  Final   Report Status 09/30/2015 FINAL  Final  Wound culture     Status: None   Collection Time: 09/26/15  3:02 PM  Result Value Ref Range Status   Specimen Description WOUND  Final   Special Requests Normal  Final   Gram Stain RARE WBC SEEN NO ORGANISMS SEEN   Final   Culture LIGHT GROWTH STAPHYLOCOCCUS AUREUS  Final   Report Status 09/29/2015 FINAL  Final   Organism ID, Bacteria STAPHYLOCOCCUS AUREUS   Final      Susceptibility   Staphylococcus aureus - MIC*    OXACILLIN Value in next row Sensitive      SENSITIVE<=0.25    GENTAMICIN Value in next row Sensitive      SENSITIVE<=0.5    CIPROFLOXACIN Value in next row Sensitive      SENSITIVE<=0.5    ERYTHROMYCIN Value in next row Sensitive      SENSITIVE<=0.25    CLINDAMYCIN Value in next row Sensitive      SENSITIVE<=0.25    VANCOMYCIN Value in next row Sensitive      SENSITIVE1    TETRACYCLINE Value in next row Sensitive      SENSITIVE<=1    RIFAMPIN Value in next row Sensitive      SENSITIVE<=0.5    TRIMETH/SULFA Value in next row Sensitive      SENSITIVE<=10    * LIGHT GROWTH STAPHYLOCOCCUS AUREUS    Coagulation Studies: No results for input(s): LABPROT, INR in the last 72 hours.  Urinalysis: No results for input(s): COLORURINE, LABSPEC, PHURINE, GLUCOSEU, HGBUR, BILIRUBINUR, KETONESUR, PROTEINUR, UROBILINOGEN, NITRITE, LEUKOCYTESUR in the last 72 hours.  Invalid input(s): APPERANCEUR    Imaging: No results found.   Medications:     . aspirin EC  81 mg Oral Daily  . carvedilol  12.5 mg Oral BID WC  .  ceFAZolin (ANCEF) IV  1 g Intravenous Q24H  . cloNIDine  0.1 mg Oral BID  . hydrALAZINE  25 mg Oral 3 times per day  . mirtazapine  15 mg Oral QHS  . senna-docusate  1 tablet Oral BID   acetaminophen **OR** acetaminophen, ALPRAZolam, alteplase, hydrALAZINE, lidocaine (PF), lidocaine-prilocaine, morphine injection, nitroGLYCERIN, ondansetron **OR** ondansetron (ZOFRAN) IV, oxyCODONE, pentafluoroprop-tetrafluoroeth, sodium chloride  Assessment/ Plan:  55 y.o.white male  with hypertension, chronic low back pain, scoliosis, anxiety, Down's Baseline Cr 3.4 who was admitted to [facility] on 09/19/2015 for evaluation of uncontrolled hypertension.   1. End Stage Renal Disease: completed Four treatments of hemodialysis.  Baseline creatinine 3.4 on 12/28/14.  Etiology of ESRD unclear but most likely secondary to uncontrolled HTN  and subsequent focal glomeruloscerlosis with significant proteinuria. Serologic work up negative.   -Patient had first hemodialysis treatment 09/23/15. - Working on discharge dialysis planning. Family prefers [place].   2. Anemia chronic kidney disease. Hgb 9.3 - epo with dialysis treatment.   3. Secondary hyperparathyroidism. PTH 411, phos 4.1   - not currently  on a binder or vitamin D agent.   4. Hypertension.  Blood pressure at goal. -  continue Coreg, clonidine, and hydralazine   LOS: [place], [place] 1/28/201711:43 AM

## 2015-09-30 NOTE — Consult Note (Signed)
  Psychiatry: Patient seen. Chart reviewed. Patient today was pretty sedated and sluggish. He was not able to answer any questions. Seem to be a little disoriented. Still recovering from surgery. No changed any treatment right now. I will continue to try and check up with him daily.

## 2015-09-30 NOTE — Progress Notes (Signed)
Pt is still very sleepy and is refusing to eat. VSS. Will continue to monitor. Safety sitter at bedside.

## 2015-10-01 NOTE — Progress Notes (Signed)
[facility]  ROUNDING NOTE   Subjective:   Family at bedside.  Patient is resting, told not wake   Objective:  Vital signs in last 24 hours:  Temp:  [97.2 F (36.2 C)-98.7 F (37.1 C)] 98.3 F (36.8 C) (01/29 0517) Pulse Rate:  [70-76] 76 (01/29 0517) Resp:  [15-18] 18 (01/29 0517) BP: (104-129)/(61-76) 129/61 mmHg (01/29 0517) SpO2:  [89 %-100 %] 89 % (01/29 0517) Weight:  [114.941 kg (253 lb 6.4 oz)] 114.941 kg (253 lb 6.4 oz) (01/29 0646)  Weight change: -2.659 kg (-5 lb 13.8 oz) Filed Weights   09/29/15 1750 09/29/15 2052 10/01/15 0646  Weight: 117.6 kg (259 lb 4.2 oz) 115.4 kg (254 lb 6.6 oz) 114.941 kg (253 lb 6.4 oz)    Intake/Output: I/O last 3 completed shifts: In: 480 [P.O.:480] Out: 2925 [Urine:1425; Other:1500]   Intake/Output this shift:  Total I/O In: -  Out: 200 [Urine:200]  Physical Exam: General: NAD, laying in bed.   Head: Normocephalic, atraumatic. Moist oral mucosal membranes  Eyes: Anicteric, PERRL  Neck: Supple, trachea midline  Lungs:  Clear to auscultation normal effort  Heart: Regular rate and rhythm  Abdomen:  Soft, nontender, BS present  Extremities: 1+ peripheral edema.  Neurologic: Nonfocal, moving all four extremities  Skin: No lesions  Access: RIJ permcath 09/29/15 Dr. Delana Meyer    Basic Metabolic Panel:  Recent Labs Lab 09/24/15 1929 09/25/15 0938 09/25/15 1535 09/27/15 1100 09/29/15 0921  NA 137 139 138 136 139  K 4.7 4.6 4.1 4.2 4.3  CL 110 104 101 101 103  CO2 22 27 30 24 28   GLUCOSE 140* 102* 117* 111* 86  BUN 56* 38* 24* 41* 46*  CREATININE 4.91* 4.08* 3.39* 5.38* 5.52*  CALCIUM 7.6* 7.9* 7.8* 7.9* 8.1*  PHOS 4.2 4.0  --  3.1 4.1    Liver Function Tests:  Recent Labs Lab 09/24/15 1929 09/25/15 0938 09/25/15 1535 09/27/15 1100 09/29/15 0921  AST  --   --  13*  --   --   ALT  --   --  16*  --   --   ALKPHOS  --   --  107  --   --   BILITOT  --   --  0.8  --   --   PROT  --   --  5.9*  --    --   ALBUMIN 3.4* 3.2* 3.1* 2.9* 2.8*   No results for input(s): LIPASE, AMYLASE in the last 168 hours. No results for input(s): AMMONIA in the last 168 hours.  CBC:  Recent Labs Lab 09/25/15 0938 09/26/15 0947 09/27/15 1100 09/28/15 0621 09/29/15 1755  WBC 5.2 3.1* 5.0 5.2 5.5  HGB 9.9* 9.4* 10.0* 9.2* 9.3*  HCT 29.6* 28.9* 30.3* 27.7* 28.3*  MCV 89.1 88.9 90.3 89.9 88.6  PLT 84* 76* 79* 79* 100*    Cardiac Enzymes:  Recent Labs Lab 09/28/15 0621  TROPONINI 0.04*    BNP: Invalid input(s): POCBNP  CBG: No results for input(s): GLUCAP in the last 168 hours.  Microbiology: Results for orders placed or performed during the hospital encounter of 09/19/15  CULTURE, BLOOD (ROUTINE X 2) w Reflex to PCR ID Panel     Status: None   Collection Time: 09/25/15 11:08 AM  Result Value Ref Range Status   Specimen Description BLOOD LEFT HAND  Final   Special Requests BOTTLES DRAWN AEROBIC AND ANAEROBIC [place]  Final   Culture NO GROWTH 5 DAYS  Final  Report Status 09/30/2015 FINAL  Final  CULTURE, BLOOD (ROUTINE X 2) w Reflex to PCR ID Panel     Status: None   Collection Time: 09/25/15  3:41 PM  Result Value Ref Range Status   Specimen Description BLOOD LINE  Final   Special Requests   Final    BOTTLES DRAWN AEROBIC AND ANAEROBIC AERO 8CC ANA 14CC   Culture NO GROWTH 5 DAYS  Final   Report Status 09/30/2015 FINAL  Final  Wound culture     Status: None   Collection Time: 09/26/15  3:02 PM  Result Value Ref Range Status   Specimen Description WOUND  Final   Special Requests Normal  Final   Gram Stain RARE WBC SEEN NO ORGANISMS SEEN   Final   Culture LIGHT GROWTH STAPHYLOCOCCUS AUREUS  Final   Report Status 09/29/2015 FINAL  Final   Organism ID, Bacteria STAPHYLOCOCCUS AUREUS  Final      Susceptibility   Staphylococcus aureus - MIC*    OXACILLIN Value in next row Sensitive      SENSITIVE<=0.25    GENTAMICIN Value in next row Sensitive      SENSITIVE<=0.5     CIPROFLOXACIN Value in next row Sensitive      SENSITIVE<=0.5    ERYTHROMYCIN Value in next row Sensitive      SENSITIVE<=0.25    CLINDAMYCIN Value in next row Sensitive      SENSITIVE<=0.25    VANCOMYCIN Value in next row Sensitive      SENSITIVE1    TETRACYCLINE Value in next row Sensitive      SENSITIVE<=1    RIFAMPIN Value in next row Sensitive      SENSITIVE<=0.5    TRIMETH/SULFA Value in next row Sensitive      SENSITIVE<=10    * LIGHT GROWTH STAPHYLOCOCCUS AUREUS    Coagulation Studies: No results for input(s): LABPROT, INR in the last 72 hours.  Urinalysis: No results for input(s): COLORURINE, LABSPEC, PHURINE, GLUCOSEU, HGBUR, BILIRUBINUR, KETONESUR, PROTEINUR, UROBILINOGEN, NITRITE, LEUKOCYTESUR in the last 72 hours.  Invalid input(s): APPERANCEUR    Imaging: No results found.   Medications:     . aspirin EC  81 mg Oral Daily  . carvedilol  12.5 mg Oral BID WC  .  ceFAZolin (ANCEF) IV  1 g Intravenous Q24H  . cloNIDine  0.1 mg Oral BID  . hydrALAZINE  25 mg Oral 3 times per day  . mirtazapine  15 mg Oral QHS  . senna-docusate  1 tablet Oral BID   acetaminophen **OR** acetaminophen, ALPRAZolam, alteplase, hydrALAZINE, lidocaine (PF), lidocaine-prilocaine, morphine injection, nitroGLYCERIN, ondansetron **OR** ondansetron (ZOFRAN) IV, oxyCODONE, pentafluoroprop-tetrafluoroeth, sodium chloride  Assessment/ Plan:  55 y.o.white male  with hypertension, chronic low back pain, scoliosis, anxiety, Down's Baseline Cr 3.4 who was admitted to [facility] on 09/19/2015 for evaluation of uncontrolled hypertension.   1. End Stage Renal Disease: completed Four treatments of hemodialysis.  Baseline creatinine 3.4 on 12/28/14.  Etiology of ESRD unclear but most likely secondary to uncontrolled HTN and subsequent focal glomeruloscerlosis with significant proteinuria. Serologic work up negative.   -Patient had first hemodialysis treatment 09/23/15. - Working on discharge dialysis  planning. Family prefers [place].  - next treatment Monday.   2. Anemia chronic kidney disease. Hgb 9.3 - epo with dialysis treatment.   3. Secondary hyperparathyroidism. PTH 411, phos 4.1   - not currently on a binder or vitamin D agent.   4. Hypertension.  Blood pressure at goal. -  continue  Coreg, clonidine, and hydralazine   LOS: 11 Ronnie Keith 1/29/201711:41 AM

## 2015-10-01 NOTE — Consult Note (Signed)
  Psychiatry: Follow-up 55 year old man now with dialysis. Patient seen. Family was present. Patient was sound asleep and they tell me he has been staying asleep much of the day. They said when he was up he was feeling better. I did not want to wake him up and distress him if he is just now getting some rest. No changed any medication. I will follow-up and see how he is doing all the next few days.

## 2015-10-01 NOTE — Progress Notes (Signed)
Ronnie Keith NAME: Ronnie Keith    MR#:  [id number]  DATE OF BIRTH:  [birth year]   Awake, alert. Trying to eat breakfast. He says he is not hungry. Complains of back pain.  REVIEW OF SYSTEMS:   Review of Systems  Constitutional: Negative for fever, chills and weight loss.  HENT: Negative for ear discharge, ear pain and nosebleeds.   Eyes: Negative for blurred vision, pain and discharge.  Respiratory: Negative for sputum production, shortness of breath, wheezing and stridor.   Cardiovascular: Positive for leg swelling. Negative for chest pain, palpitations, orthopnea and PND.  Gastrointestinal: Negative for nausea, vomiting and diarrhea.  Genitourinary: Negative for urgency and frequency.  Musculoskeletal: Negative for back pain and joint pain.  Neurological: Negative for sensory change, speech change, focal weakness and weakness.  Psychiatric/Behavioral: Negative for depression and hallucinations. The patient is not nervous/anxious.    Tolerating Diet:yes Tolerating PT: not needed  DRUG ALLERGIES:   No Known Allergies  VITALS:  Blood pressure 129/61, pulse 76, temperature 98.3 F (36.8 C), temperature source Oral, resp. rate 18, height 6' (1.829 m), weight 114.941 kg (253 lb 6.4 oz), SpO2 89 %. PHYSICAL EXAMINATION:   Physical Exam  GENERAL:  55 y.o.-year-old patient lying in the bed with no acute distress.  EYES: Pupils equal, round, reactive to light and accommodation. No scleral icterus. Extraocular muscles intact.  HEENT: Head atraumatic, normocephalic. Oropharynx and nasopharynx clear.  NECK:  Supple, no jugular venous distention. No thyroid enlargement, no tenderness.  LUNGS: Normal breath sounds bilaterally, no wheezing, rales, rhonchi. No use of accessory muscles of respiration.  CARDIOVASCULAR: S1, S2 normal. No murmurs, rubs, or gallops.  ABDOMEN; bowel sounds present,no rebound tenderness, bowel sounds  presentS: No cyanosis, clubbing , ++ edema b/l.   NEUROLOGIC: Cranial nerves II through XII are intact. No focal Motor or sensory deficits b/l.   PSYCHIATRIC: The patient is alert and oriented x 3.  SKIN: No obvious rash, lesion, or ulcer.   LABORATORY PANEL:  CBC  Recent Labs Lab 09/29/15 1755  WBC 5.5  HGB 9.3*  HCT 28.3*  PLT 100*    Chemistries   Recent Labs Lab 09/25/15 1535  09/29/15 0921  NA 138  < > 139  K 4.1  < > 4.3  CL 101  < > 103  CO2 30  < > 28  GLUCOSE 117*  < > 86  BUN 24*  < > 46*  CREATININE 3.39*  < > 5.52*  CALCIUM 7.8*  < > 8.1*  AST 13*  --   --   ALT 16*  --   --   ALKPHOS 107  --   --   BILITOT 0.8  --   --   < > = values in this interval not displayed.  Cardiac Enzymes  Recent Labs Lab 09/28/15 0621  TROPONINI 0.04*   RADIOLOGY:  No results found. ASSESSMENT AND PLAN:   55 year old Caucasian gentleman history of essential hypertension is presenting with elevated blood pressure.  1. R arm thrombophlebitis: antecub pustule seen at old IV site  Blood cultures are  negative to date.had chest x-ray negative. Patient IV site  is still indurated. He has MSSA. Continue IV Ancef to February 2. Seen by ID  2. Hypertensive urgency: Continue Coreg, clonidine and hydralazine - blood pressures improved  3. End-stage renal disease now on hemodialysis; status permacath placement.remove Right IJ<>. Spoke with Dr. Juleen China, Patient has no insurance,  waiting for insurance approval for out  patient dialysis -  4. Hyperkalemia: Received Kayexalate potassium improved -Resolved -off telemetry  5.abdominal pain likely due to constipation: Continue PPIs, stool softeners.  6.depression: Started on Remeron. appreciate psych following.  * Venous thromboembolism prophylactic: Heparin subcutaneous   Case discussed with Care Management/Social Worker. Management plans discussed with the patient, family and they are in agreement.  CODE STATUS:  full  DVT Prophylaxis: heparin  TOTAL TIME TAKING CARE OF THIS PATIENT: 30 minutes.     POSSIBLE D/C IN 2-3 DAYS, DEPENDING ON CLINICAL CONDITION.-   Note: This dictation was prepared with Dragon dictation along with smaller phrase technology. Any transcriptional errors that result from this process are unintentional.  Chellie Vanlue M.D on 10/01/2015 at 12:17 PM  Between 7am to 6pm - Pager - [phone]  After 6pm go to [email or web address] - password [email or web address] [facility]  [place] [facility]  Office  [phone]  CC: Primary care physician; No primary care provider on file.

## 2015-10-02 LAB — CBC
HEMATOCRIT: 28.5 % — AB (ref 40.0–52.0)
HEMOGLOBIN: 9.4 g/dL — AB (ref 13.0–18.0)
MCH: 29.6 pg (ref 26.0–34.0)
MCHC: 32.9 g/dL (ref 32.0–36.0)
MCV: 90 fL (ref 80.0–100.0)
Platelets: 127 10*3/uL — ABNORMAL LOW (ref 150–440)
RBC: 3.16 MIL/uL — ABNORMAL LOW (ref 4.40–5.90)
RDW: 13.2 % (ref 11.5–14.5)
WBC: 4.9 10*3/uL (ref 3.8–10.6)

## 2015-10-02 LAB — RENAL FUNCTION PANEL
ALBUMIN: 2.7 g/dL — AB (ref 3.5–5.0)
ANION GAP: 4 — AB (ref 5–15)
BUN: 41 mg/dL — AB (ref 6–20)
CO2: 33 mmol/L — ABNORMAL HIGH (ref 22–32)
Calcium: 8.4 mg/dL — ABNORMAL LOW (ref 8.9–10.3)
Chloride: 104 mmol/L (ref 101–111)
Creatinine, Ser: 5.45 mg/dL — ABNORMAL HIGH (ref 0.61–1.24)
GFR calc Af Amer: 13 mL/min — ABNORMAL LOW (ref >60)
GFR, EST NON AFRICAN AMERICAN: 11 mL/min — AB (ref >60)
Glucose, Bld: 109 mg/dL — ABNORMAL HIGH (ref 65–99)
PHOSPHORUS: 3.5 mg/dL (ref 2.5–4.6)
POTASSIUM: 4 mmol/L (ref 3.5–5.1)
Sodium: 141 mmol/L (ref 135–145)

## 2015-10-02 MED ORDER — EPOETIN ALFA 10000 UNIT/ML IJ SOLN
10000.0000 [IU] | INTRAMUSCULAR | Status: DC
  Administered 2015-10-02 – 2015-10-06 (×3): 10000 [IU] via INTRAVENOUS

## 2015-10-02 NOTE — Progress Notes (Addendum)
[place] [facility] [place] NAME: Ronnie Keith    MR#:  [id number]  DATE OF BIRTH:  [birth year]   The patient was asleep while he was on dialysis  REVIEW OF SYSTEMS:   Review of Systems  Unable to perform ROS Constitutional: Negative for fever, chills and weight loss.  HENT: Negative for ear discharge, ear pain and nosebleeds.   Eyes: Negative for blurred vision, pain and discharge.  Respiratory: Negative for sputum production, shortness of breath, wheezing and stridor.   Cardiovascular: Positive for leg swelling. Negative for chest pain, palpitations, orthopnea and PND.  Gastrointestinal: Negative for nausea, vomiting and diarrhea.  Genitourinary: Negative for urgency and frequency.  Musculoskeletal: Negative for back pain and joint pain.  Neurological: Negative for sensory change, speech change, focal weakness and weakness.  Psychiatric/Behavioral: Negative for depression and hallucinations. The patient is not nervous/anxious.     DRUG ALLERGIES:   No Known Allergies  VITALS:  Blood pressure 118/67, pulse 71, temperature 98.4 F (36.9 C), temperature source Oral, resp. rate 16, height 6' (1.829 m), weight 114.488 kg (252 lb 6.4 oz), SpO2 95 %. PHYSICAL EXAMINATION:   Physical Exam  GENERAL:  55 y.o.-year-old patient lying in the bed with no acute distress.  EYES: Pupils equal, round, reactive to light and accommodation. No scleral icterus. Extraocular muscles intact.  HEENT: Head atraumatic, normocephalic.  NECK:  Supple, no jugular venous distention. No thyroid enlargement, no tenderness.  LUNGS: Normal breath sounds bilaterally, no wheezing, rales, rhonchi. No use of accessory muscles of respiration.  CARDIOVASCULAR: S1, S2 normal. No murmurs, rubs, or gallops.  ABDOMEN; bowel sounds present,no rebound tenderness, bowel sounds presentS: No cyanosis, clubbing , ++ edema b/l.   NEUROLOGIC: unable to exam. PSYCHIATRIC: The patient  was asleep.  SKIN: No obvious rash, lesion, or ulcer.   LABORATORY PANEL:  CBC  Recent Labs Lab 10/02/15 0937  WBC 4.9  HGB 9.4*  HCT 28.5*  PLT 127*    Chemistries   Recent Labs Lab 09/25/15 1535  10/02/15 0937  NA 138  < > 141  K 4.1  < > 4.0  CL 101  < > 104  CO2 30  < > 33*  GLUCOSE 117*  < > 109*  BUN 24*  < > 41*  CREATININE 3.39*  < > 5.45*  CALCIUM 7.8*  < > 8.4*  AST 13*  --   --   ALT 16*  --   --   ALKPHOS 107  --   --   BILITOT 0.8  --   --   < > = values in this interval not displayed.  Cardiac Enzymes  Recent Labs Lab 09/28/15 0621  TROPONINI 0.04*   RADIOLOGY:  No results found. ASSESSMENT AND PLAN:   55 year old Caucasian gentleman history of essential hypertension is presenting with elevated blood pressure.  1. R arm thrombophlebitis: antecub pustule seen at old IV site  Blood cultures are  negative to date.had chest x-ray negative. Patient IV site  is still indurated. He has MSSA. Continue IV Ancef to February 2. Seen by ID  2. Hypertensive urgency: Continue Coreg, clonidine and hydralazine - blood pressures is controlled.  3. End-stage renal disease now on hemodialysis; status permacath placement. removef Right IJ<>. Patient has no insurance, waiting for insurance approval for out  patient dialysis -  4. Hyperkalemia: Received Kayexalate potassium,  improved  5.abdominal pain likely due to constipation: Continue PPIs, stool softeners.  6.depression: Started  on Remeron. appreciate psych following.  * Venous thromboembolism prophylactic: Heparin subcutaneous.  I discussed with Dr. Candiss Norse. Case discussed with Care Management/Social Worker. Management plans discussed with the patient, family and they are in agreement.  CODE STATUS: full  DVT Prophylaxis: heparin  TOTAL TIME TAKING CARE OF THIS PATIENT: 35 minutes.  Greater than 50% time was spent on coordination of care and face-to-face counseling.   POSSIBLE D/C IN 2 DAYS,  DEPENDING ON CLINICAL CONDITION.  Note: This dictation was prepared with Dragon dictation along with smaller phrase technology. Any transcriptional errors that result from this process are unintentional.  [name] M.D on 10/02/2015 at 3:15 PM  Between 7am to 6pm - Pager - [phone]  After 6pm go to [email or web address] - password [email or web address] [facility]  [place] [facility]  Office  [phone]  CC: Primary care physician; No primary care provider on file.

## 2015-10-02 NOTE — Plan of Care (Signed)
Problem: Skin Integrity: Goal: Risk for impaired skin integrity will decrease Outcome: Progressing Pt remained free from infection during my shift.

## 2015-10-02 NOTE — Care Management Note (Signed)
Case Management Note  Patient Details  Name: Ronnie Keith MRN: [id number] Date of Birth: 1960-11-30  Subjective/Objective:       Dr Bridgett Larsson inquired about whether outpatient hemodialysis has been arranged for uninsured Ronnie Keith who resides in [place]. Ronnie Keith is working on this and Keith request for an update was texted to her today by this Probation officer. .              Action/Plan:   Expected Discharge Date:                  Expected Discharge Plan:     In-House Referral:     Discharge planning Services     Post Acute Care Choice:    Choice offered to:     DME Arranged:    DME Agency:     HH Arranged:    [place] Agency:     Status of Service:     Medicare Important Message Given:    Date Medicare IM Given:    Medicare IM give by:    Date Additional Medicare IM Given:    Additional Medicare Important Message give by:     If discussed at [place] of Stay Meetings, dates discussed:    Additional Comments:  Ronnie Divis A, RN 10/02/2015, 10:01 AM

## 2015-10-02 NOTE — Progress Notes (Signed)
[facility]  ROUNDING NOTE   Subjective:   Patient seen during dialysis Tolerating well  Did not wake up to verbal commands   Objective:  Vital signs in last 24 hours:  Temp:  [97.8 F (36.6 C)-98.6 F (37 C)] 98.6 F (37 C) (01/30 0957) Pulse Rate:  [67-76] 73 (01/30 1100) Resp:  [16-20] 16 (01/30 0511) BP: (118-141)/(64-80) 141/80 mmHg (01/30 0511) SpO2:  [90 %-96 %] 95 % (01/30 0957) Weight:  [114.488 kg (252 lb 6.4 oz)] 114.488 kg (252 lb 6.4 oz) (01/30 0511)  Weight change: -0.454 kg (-1 lb) Filed Weights   09/29/15 2052 10/01/15 0646 10/02/15 0511  Weight: 115.4 kg (254 lb 6.6 oz) 114.941 kg (253 lb 6.4 oz) 114.488 kg (252 lb 6.4 oz)    Intake/Output: I/O last 3 completed shifts: In: 480 [P.O.:480] Out: 1250 [Urine:1250]   Intake/Output this shift:     Physical Exam: General: NAD, laying in bed.   Head: Normocephalic, atraumatic. Moist oral mucosal membranes  Eyes: Anicteric,    Neck: Supple, trachea midline  Lungs:  Clear to auscultation normal effort  Heart: Regular rate and rhythm  Abdomen:  Soft, nontender, BS present  Extremities: 1+ peripheral edema.  Neurologic: Nonfocal, moving all four extremities  Skin: No lesions  Access: RIJ permcath 09/29/15 Dr. Delana Meyer    Basic Metabolic Panel:  Recent Labs Lab 09/25/15 1535 09/27/15 1100 09/29/15 0921 10/02/15 0937  NA 138 136 139 141  K 4.1 4.2 4.3 4.0  CL 101 101 103 104  CO2 30 24 28  33*  GLUCOSE 117* 111* 86 109*  BUN 24* 41* 46* 41*  CREATININE 3.39* 5.38* 5.52* 5.45*  CALCIUM 7.8* 7.9* 8.1* 8.4*  PHOS  --  3.1 4.1 3.5    Liver Function Tests:  Recent Labs Lab 09/25/15 1535 09/27/15 1100 09/29/15 0921 10/02/15 0937  AST 13*  --   --   --   ALT 16*  --   --   --   ALKPHOS 107  --   --   --   BILITOT 0.8  --   --   --   PROT 5.9*  --   --   --   ALBUMIN 3.1* 2.9* 2.8* 2.7*   No results for input(s): LIPASE, AMYLASE in the last 168 hours. No results for input(s):  AMMONIA in the last 168 hours.  CBC:  Recent Labs Lab 09/26/15 0947 09/27/15 1100 09/28/15 0621 09/29/15 1755 10/02/15 0937  WBC 3.1* 5.0 5.2 5.5 4.9  HGB 9.4* 10.0* 9.2* 9.3* 9.4*  HCT 28.9* 30.3* 27.7* 28.3* 28.5*  MCV 88.9 90.3 89.9 88.6 90.0  PLT 76* 79* 79* 100* 127*    Cardiac Enzymes:  Recent Labs Lab 09/28/15 0621  TROPONINI 0.04*    BNP: Invalid input(s): POCBNP  CBG: No results for input(s): GLUCAP in the last 168 hours.  Microbiology: Results for orders placed or performed during the hospital encounter of 09/19/15  CULTURE, BLOOD (ROUTINE X 2) w Reflex to PCR ID Panel     Status: None   Collection Time: 09/25/15 11:08 AM  Result Value Ref Range Status   Specimen Description BLOOD LEFT HAND  Final   Special Requests BOTTLES DRAWN AEROBIC AND ANAEROBIC [place]  Final   Culture NO GROWTH 5 DAYS  Final   Report Status 09/30/2015 FINAL  Final  CULTURE, BLOOD (ROUTINE X 2) w Reflex to PCR ID Panel     Status: None   Collection Time: 09/25/15  3:41  PM  Result Value Ref Range Status   Specimen Description BLOOD LINE  Final   Special Requests   Final    BOTTLES DRAWN AEROBIC AND ANAEROBIC AERO 8CC ANA 14CC   Culture NO GROWTH 5 DAYS  Final   Report Status 09/30/2015 FINAL  Final  Wound culture     Status: None   Collection Time: 09/26/15  3:02 PM  Result Value Ref Range Status   Specimen Description WOUND  Final   Special Requests Normal  Final   Gram Stain RARE WBC SEEN NO ORGANISMS SEEN   Final   Culture LIGHT GROWTH STAPHYLOCOCCUS AUREUS  Final   Report Status 09/29/2015 FINAL  Final   Organism ID, Bacteria STAPHYLOCOCCUS AUREUS  Final      Susceptibility   Staphylococcus aureus - MIC*    OXACILLIN Value in next row Sensitive      SENSITIVE<=0.25    GENTAMICIN Value in next row Sensitive      SENSITIVE<=0.5    CIPROFLOXACIN Value in next row Sensitive      SENSITIVE<=0.5    ERYTHROMYCIN Value in next row Sensitive      SENSITIVE<=0.25     CLINDAMYCIN Value in next row Sensitive      SENSITIVE<=0.25    VANCOMYCIN Value in next row Sensitive      SENSITIVE1    TETRACYCLINE Value in next row Sensitive      SENSITIVE<=1    RIFAMPIN Value in next row Sensitive      SENSITIVE<=0.5    TRIMETH/SULFA Value in next row Sensitive      SENSITIVE<=10    * LIGHT GROWTH STAPHYLOCOCCUS AUREUS    Coagulation Studies: No results for input(s): LABPROT, INR in the last 72 hours.  Urinalysis: No results for input(s): COLORURINE, LABSPEC, PHURINE, GLUCOSEU, HGBUR, BILIRUBINUR, KETONESUR, PROTEINUR, UROBILINOGEN, NITRITE, LEUKOCYTESUR in the last 72 hours.  Invalid input(s): APPERANCEUR    Imaging: No results found.   Medications:     . aspirin EC  81 mg Oral Daily  . carvedilol  12.5 mg Oral BID WC  .  ceFAZolin (ANCEF) IV  1 g Intravenous Q24H  . cloNIDine  0.1 mg Oral BID  . epoetin (EPOGEN/PROCRIT) injection  10,000 Units Intravenous Q M,W,F-HD  . hydrALAZINE  25 mg Oral 3 times per day  . mirtazapine  15 mg Oral QHS  . senna-docusate  1 tablet Oral BID   acetaminophen **OR** acetaminophen, ALPRAZolam, alteplase, hydrALAZINE, lidocaine (PF), lidocaine-prilocaine, morphine injection, nitroGLYCERIN, ondansetron **OR** ondansetron (ZOFRAN) IV, oxyCODONE, pentafluoroprop-tetrafluoroeth, sodium chloride  Assessment/ Plan:  55 y.o.white male  with hypertension, chronic low back pain, scoliosis, anxiety, Down's Baseline Cr 3.4 who was admitted to [facility] on 09/19/2015 for evaluation of uncontrolled hypertension.   1. End Stage Renal Disease: completed Four treatments of hemodialysis.  Baseline creatinine 3.4 on 12/28/14.  Etiology of ESRD unclear but most likely secondary to uncontrolled HTN and  focal glomeruloscerlosis with significant proteinuria. Serologic work up negative.   -Patient had first hemodialysis treatment 09/23/15. - Working on discharge dialysis planning. Family prefers [place]. Dialysis d/c planning in  progress per Maudie Mercury Riddle's note from 1/26   2. Anemia chronic kidney disease. Hgb 9.4 - epo with dialysis treatment.   3. Secondary hyperparathyroidism. PTH 411, phos 3.5 - not currently on a binder or vitamin D agent.      LOS: 12 Micca Matura 1/30/201711:39 AM

## 2015-10-03 MED ORDER — NEPRO/CARBSTEADY PO LIQD
237.0000 mL | Freq: Three times a day (TID) | ORAL | Status: DC
  Administered 2015-10-03 – 2015-10-07 (×5): 237 mL via ORAL

## 2015-10-03 MED ORDER — HEPARIN SODIUM (PORCINE) 5000 UNIT/ML IJ SOLN
5000.0000 [IU] | Freq: Three times a day (TID) | INTRAMUSCULAR | Status: DC
Start: 2015-10-03 — End: 2015-10-06
  Administered 2015-10-03 – 2015-10-06 (×9): 5000 [IU] via SUBCUTANEOUS
  Filled 2015-10-03 (×9): qty 1

## 2015-10-03 NOTE — Progress Notes (Signed)
Nutrition Follow-up     INTERVENTION:  Meals and snacks: Spoke with Dr. Candiss Norse and agreeable to liberalizing diet to regular as pt is not eating. Medical Nutrition Supplement Therapy: Recommend adding nepro TID for added nutrition   NUTRITION DIAGNOSIS:   Inadequate oral intake related to acute illness as evidenced by  (nausea, vomiting this am).    GOAL:   Patient will meet greater than or equal to 90% of their needs    MONITOR:    (Energy intake, Digestive system)  REASON FOR ASSESSMENT:   LOS    ASSESSMENT:      Pt asleep during visit this am.   Current Nutrition: no breakfast eaten this am per RN, Pryor Montes and limited intake since admission. Per RN typically eating about 1 meal per day     Scheduled Medications:  . aspirin EC  81 mg Oral Daily  . carvedilol  12.5 mg Oral BID WC  .  ceFAZolin (ANCEF) IV  1 g Intravenous Q24H  . cloNIDine  0.1 mg Oral BID  . epoetin (EPOGEN/PROCRIT) injection  10,000 Units Intravenous Q M,W,F-HD  . feeding supplement (NEPRO CARB STEADY)  237 mL Oral TID WC  . hydrALAZINE  25 mg Oral 3 times per day  . mirtazapine  15 mg Oral QHS  . senna-docusate  1 tablet Oral BID       Electrolyte/Renal Profile and Glucose Profile:   Recent Labs Lab 09/27/15 1100 09/29/15 0921 10/02/15 0937  NA 136 139 141  K 4.2 4.3 4.0  CL 101 103 104  CO2 24 28 33*  BUN 41* 46* 41*  CREATININE 5.38* 5.52* 5.45*  CALCIUM 7.9* 8.1* 8.4*  PHOS 3.1 4.1 3.5  GLUCOSE 111* 86 109*   Protein Profile:  Recent Labs Lab 09/27/15 1100 09/29/15 0921 10/02/15 0937  ALBUMIN 2.9* 2.8* 2.7*    Gastrointestinal Profile: Last BM:1/30    Diet Order:  Diet regular Room service appropriate?: Yes; Fluid consistency:: Thin    Height:   Ht Readings from Last 1 Encounters:  09/19/15 6' (1.829 m)    Weight:   Wt Readings from Last 1 Encounters:  10/02/15 252 lb 6.4 oz (114.488 kg)    Ideal Body Weight:     BMI:  Body mass index is  34.22 kg/(m^2).   EDUCATION NEEDS:   Education needs no appropriate at this time  [place]. Zenia Resides, [place], [place] (pager) Weekend/On-Call pager [phone])

## 2015-10-03 NOTE — Care Management (Signed)
Informed during progression that patient is sitting for dialysis.  A bed has been found at [facility] in [place] and working on payor coverage.  Patient does not have a family doctor to follow for home health.  Since hospitalization related to renal issues, will discuss whether nephrology would agree to follow for home health nursing and social work if can obtain charity care for home health.  Contacted [employer] to assess for funding for home health nurse

## 2015-10-03 NOTE — Progress Notes (Signed)
[place] [facility] [place] NAME: Ronnie Keith    MR#:  [id number]  DATE OF BIRTH:  [birth year]   The patient was asleep, unable to wake up.  REVIEW OF SYSTEMS:   Review of Systems  Unable to perform ROS   DRUG ALLERGIES:   No Known Allergies  VITALS:  Blood pressure 134/67, pulse 64, temperature 98.4 F (36.9 C), temperature source Oral, resp. rate 18, height 6' (1.829 m), weight 114.488 kg (252 lb 6.4 oz), SpO2 97 %. PHYSICAL EXAMINATION:   Physical Exam  GENERAL:  55 y.o.-year-old patient lying in the bed with no acute distress.  EYES: Pupils equal, round, reactive to light and accommodation. No scleral icterus. Extraocular muscles intact.  HEENT: Head atraumatic, normocephalic.  NECK:  Supple, no jugular venous distention. No thyroid enlargement, no tenderness.  LUNGS: Normal breath sounds bilaterally, no wheezing, rales, rhonchi. No use of accessory muscles of respiration.  CARDIOVASCULAR: S1, S2 normal. No murmurs, rubs, or gallops.  ABDOMEN; bowel sounds present,no rebound tenderness, bowel sounds presentS: No cyanosis, clubbing , no edema.   NEUROLOGIC: unable to exam. PSYCHIATRIC: The patient was asleep.  SKIN: No obvious rash, lesion, or ulcer.   LABORATORY PANEL:  CBC  Recent Labs Lab 10/02/15 0937  WBC 4.9  HGB 9.4*  HCT 28.5*  PLT 127*    Chemistries   Recent Labs Lab 10/02/15 0937  NA 141  K 4.0  CL 104  CO2 33*  GLUCOSE 109*  BUN 41*  CREATININE 5.45*  CALCIUM 8.4*    Cardiac Enzymes  Recent Labs Lab 09/28/15 0621  TROPONINI 0.04*   RADIOLOGY:  No results found. ASSESSMENT AND PLAN:   55 year old Caucasian gentleman history of essential hypertension is presenting with elevated blood pressure.  1. R arm thrombophlebitis: antecub pustule seen at old IV site  Blood cultures are  negative to date.had chest x-ray negative. He has MSSA. Continue IV Ancef to February 2. Seen by ID  2.  Hypertensive urgency:Controlled with Coreg, clonidine and hydralazine  3. End-stage renal disease now on hemodialysis; status permacath placement. Removed Right IJ. Patient has no insurance, waiting for insurance approval for out  patient dialysis   4. Hyperkalemia: Received Kayexalate potassium,  improved  5.abdominal pain likely due to constipation: improved. Continue PPIs, stool softeners.  6.depression: Started on Remeron. appreciate psych following.  * Venous thromboembolism prophylactic: Heparin subcutaneous.  I discussed with Dr. Candiss Norse. Case discussed with Care Management/Social Worker. Management plans discussed with the patient, family and they are in agreement.  CODE STATUS: full  DVT Prophylaxis: heparin  TOTAL TIME TAKING CARE OF THIS PATIENT: 28 minutes.  Greater than 50% time was spent on coordination of care and face-to-face counseling.   POSSIBLE D/C IN 2 DAYS, DEPENDING ON CLINICAL CONDITION.  Note: This dictation was prepared with Dragon dictation along with smaller phrase technology. Any transcriptional errors that result from this process are unintentional.  [name] M.D on 10/03/2015 at 4:19 PM  Between 7am to 6pm - Pager - [phone]  After 6pm go to [email or web address] - password [email or web address] [facility]  [facility]  Office  [phone]  CC: Primary care physician; No primary care provider on file.

## 2015-10-03 NOTE — Plan of Care (Signed)
Problem: Nutrition: Goal: Adequate nutrition will be maintained Outcome: Not Progressing Pt will not drink ordered supplement drinks. Also only eating bread each day.

## 2015-10-03 NOTE — Progress Notes (Signed)
[facility]  ROUNDING NOTE   Subjective:   Patient reports that he doesn't feel well today Want [place] to "leave  Him alone" Mother at bedside Patient wants to go home by himself when he has never lived by himself previously No SOB No acute Pain No edema   Objective:  Vital signs in last 24 hours:  Temp:  [98.1 F (36.7 C)-98.4 F (36.9 C)] 98.1 F (36.7 C) (01/31 0541) Pulse Rate:  [62-74] 74 (01/31 0823) Resp:  [14-20] 20 (01/31 0541) BP: (101-138)/(46-90) 101/46 mmHg (01/31 0823) SpO2:  [89 %-99 %] 93 % (01/31 0825)  Weight change:  Filed Weights   09/29/15 2052 10/01/15 0646 10/02/15 0511  Weight: 115.4 kg (254 lb 6.6 oz) 114.941 kg (253 lb 6.4 oz) 114.488 kg (252 lb 6.4 oz)    Intake/Output: I/O last 3 completed shifts: In: 0  Out: 2875 [id number]; Other:1500]   Intake/Output this shift:     Physical Exam: General: NAD, laying in bed.   Head: Normocephalic, atraumatic. Moist oral mucosal membranes  Eyes: Anicteric,    Neck: Supple, trachea midline  Lungs:  Clear to auscultation normal effort  Heart: Regular rate and rhythm  Abdomen:  Soft, nontender, BS present, distended  Extremities: No  peripheral edema.  Neurologic: Nonfocal, moving all four extremities  Skin: No lesions  Access: RIJ permcath 09/29/15 Dr. Delana Meyer    Basic Metabolic Panel:  Recent Labs Lab 09/27/15 1100 09/29/15 0921 10/02/15 0937  NA 136 139 141  K 4.2 4.3 4.0  CL 101 103 104  CO2 24 28 33*  GLUCOSE 111* 86 109*  BUN 41* 46* 41*  CREATININE 5.38* 5.52* 5.45*  CALCIUM 7.9* 8.1* 8.4*  PHOS 3.1 4.1 3.5    Liver Function Tests:  Recent Labs Lab 09/27/15 1100 09/29/15 0921 10/02/15 0937  ALBUMIN 2.9* 2.8* 2.7*   No results for input(s): LIPASE, AMYLASE in the last 168 hours. No results for input(s): AMMONIA in the last 168 hours.  CBC:  Recent Labs Lab 09/27/15 1100 09/28/15 0621 09/29/15 1755 10/02/15 0937  WBC 5.0 5.2 5.5 4.9  HGB 10.0* 9.2*  9.3* 9.4*  HCT 30.3* 27.7* 28.3* 28.5*  MCV 90.3 89.9 88.6 90.0  PLT 79* 79* 100* 127*    Cardiac Enzymes:  Recent Labs Lab 09/28/15 0621  TROPONINI 0.04*    BNP: Invalid input(s): POCBNP  CBG: No results for input(s): GLUCAP in the last 168 hours.  Microbiology: Results for orders placed or performed during the hospital encounter of 09/19/15  CULTURE, BLOOD (ROUTINE X 2) w Reflex to PCR ID Panel     Status: None   Collection Time: 09/25/15 11:08 AM  Result Value Ref Range Status   Specimen Description BLOOD LEFT HAND  Final   Special Requests BOTTLES DRAWN AEROBIC AND ANAEROBIC [place]  Final   Culture NO GROWTH 5 DAYS  Final   Report Status 09/30/2015 FINAL  Final  CULTURE, BLOOD (ROUTINE X 2) w Reflex to PCR ID Panel     Status: None   Collection Time: 09/25/15  3:41 PM  Result Value Ref Range Status   Specimen Description BLOOD LINE  Final   Special Requests   Final    BOTTLES DRAWN AEROBIC AND ANAEROBIC AERO 8CC ANA 14CC   Culture NO GROWTH 5 DAYS  Final   Report Status 09/30/2015 FINAL  Final  Wound culture     Status: None   Collection Time: 09/26/15  3:02 PM  Result Value Ref Range  Status   Specimen Description WOUND  Final   Special Requests Normal  Final   Gram Stain RARE WBC SEEN NO ORGANISMS SEEN   Final   Culture LIGHT GROWTH STAPHYLOCOCCUS AUREUS  Final   Report Status 09/29/2015 FINAL  Final   Organism ID, Bacteria STAPHYLOCOCCUS AUREUS  Final      Susceptibility   Staphylococcus aureus - MIC*    OXACILLIN Value in next row Sensitive      SENSITIVE<=0.25    GENTAMICIN Value in next row Sensitive      SENSITIVE<=0.5    CIPROFLOXACIN Value in next row Sensitive      SENSITIVE<=0.5    ERYTHROMYCIN Value in next row Sensitive      SENSITIVE<=0.25    CLINDAMYCIN Value in next row Sensitive      SENSITIVE<=0.25    VANCOMYCIN Value in next row Sensitive      SENSITIVE1    TETRACYCLINE Value in next row Sensitive      SENSITIVE<=1     RIFAMPIN Value in next row Sensitive      SENSITIVE<=0.5    TRIMETH/SULFA Value in next row Sensitive      SENSITIVE<=10    * LIGHT GROWTH STAPHYLOCOCCUS AUREUS    Coagulation Studies: No results for input(s): LABPROT, INR in the last 72 hours.  Urinalysis: No results for input(s): COLORURINE, LABSPEC, PHURINE, GLUCOSEU, HGBUR, BILIRUBINUR, KETONESUR, PROTEINUR, UROBILINOGEN, NITRITE, LEUKOCYTESUR in the last 72 hours.  Invalid input(s): APPERANCEUR    Imaging: No results found.   Medications:     . aspirin EC  81 mg Oral Daily  . carvedilol  12.5 mg Oral BID WC  .  ceFAZolin (ANCEF) IV  1 g Intravenous Q24H  . cloNIDine  0.1 mg Oral BID  . epoetin (EPOGEN/PROCRIT) injection  10,000 Units Intravenous Q M,W,F-HD  . feeding supplement (NEPRO CARB STEADY)  237 mL Oral TID WC  . hydrALAZINE  25 mg Oral 3 times per day  . mirtazapine  15 mg Oral QHS  . senna-docusate  1 tablet Oral BID   acetaminophen **OR** acetaminophen, ALPRAZolam, alteplase, hydrALAZINE, lidocaine (PF), lidocaine-prilocaine, morphine injection, nitroGLYCERIN, ondansetron **OR** ondansetron (ZOFRAN) IV, oxyCODONE, pentafluoroprop-tetrafluoroeth, sodium chloride  Assessment/ Plan:  55 y.o.white male  with hypertension, chronic low back pain, scoliosis, anxiety, Down's Baseline Cr 3.4 who was admitted to [facility] on 09/19/2015 for evaluation of uncontrolled hypertension.   1. End Stage Renal Disease: completed Four treatments of hemodialysis.  Baseline creatinine 3.4 on 12/28/14.  Etiology of ESRD unclear but most likely secondary to uncontrolled HTN and  focal glomeruloscerlosis with significant proteinuria. Serologic work up negative.   -Patient had first hemodialysis treatment 09/23/15. - Working on discharge dialysis planning. Family prefers [place]. Dialysis d/c planning in progress per Maudie Mercury Riddle's note from 1/26  2. Anemia chronic kidney disease. Hgb 9.4 - epo with dialysis treatment.   3. Secondary  hyperparathyroidism. PTH 411, phos 3.5 - not currently on a binder or vitamin D agent.   4. Depression - Patient will need re-evaluation by psychiatry as he is not clear in his healthcare decisions - He has demonstrated some agitation in the dialysis suite which if continues, will make outpatient placement difficult - will discuss with primary team     LOS: 13 Rhyder Koegel 1/31/201712:26 PM

## 2015-10-04 MED ORDER — LACTULOSE 10 GM/15ML PO SOLN
30.0000 g | Freq: Two times a day (BID) | ORAL | Status: DC | PRN
  Filled 2015-10-04 (×2): qty 60

## 2015-10-04 NOTE — Consult Note (Signed)
[facility] Consult   Reason for Consult:  Follow-up for this 55 year old man with concerns about depression and is adapting to his medical state. Referring Physician:  Bridgett Larsson Patient Identification: DAXON GREASON MRN:  [id number] Principal Diagnosis: Depression, major, recurrent, moderate ([place]) Diagnosis:   Patient Active Problem List   Diagnosis Date Noted  . Depression, major, recurrent, moderate ([place]) [F33.1] 09/28/2015  . Adjustment disorder with mixed anxiety and depressed mood [F43.23] 09/28/2015  . Hypertensive urgency [I16.0] 09/20/2015  . Acute kidney injury ([place]) [N17.9] 09/20/2015  . Hyperkalemia [E87.5] 09/20/2015    Total Time spent with patient: 30 minutes  Subjective:   Ronnie Keith is a 55 y.o. male patient admitted with "I just don't feel good".  HPI:  This a follow-up consult. I had put in a note earlier today but after I did my note I spoke to case management and nursing and reviewed the chart a little bit more and went back to speak to the patient again. This time when I went to see him he was still very lethargic and would not communicate much but I got him to communicate a little bit more. He told me that he was feeling very sick. He had multiple physical complaints. Complained of pain in his legs and his abdomen. Complained of not being hungry and feeling like food was going to make him throw up. Not described himself as depressed but he did say that he was feeling at times like giving up. I asked him if he was having any thoughts about wanting to die and he was vague about it but said that sometimes he felt that way. He was unable to go into much more detail. He didn't have any delusions and didn't talk about any hallucinations. Patient has clearly been having a rough time adapting to dialysis.  Social history: His mother was here today. She was able to communicate with him a little bit more but not much better. Thoughts. Been living with up until now. Not  clear at this point how well they are going to get along with his new illness.  Medical history: Patient has renal disease that has progressed to end-stage any now is on dialysis.  Past Psychiatric History: No clear past psychiatric history except that I have confirmed with case management that other people are aware that this gentleman has developmental disability. I standby my assessment after observing him again that he must have Down syndrome although I don't see that specifically laid out in the chart. No known history of suicide attempt.  Risk to Self: Is patient at risk for suicide?: No Risk to Others:   Prior Inpatient Therapy:   Prior Outpatient Therapy:    Past Medical History:  Past Medical History  Diagnosis Date  . Hypertension   . Chronic low back pain   . Scoliosis   . Anxiety   . Sciatica    History reviewed. No pertinent past surgical history. Family History:  Family History  Problem Relation Age of Onset  . Hypertension Other    Family Psychiatric  History: No known family psychiatric history reported Social History:  History  Alcohol Use No     History  Drug Use No    Social History   Social History  . Marital Status: Single    Spouse Name: N/A  . Number of Children: N/A  . Years of Education: N/A   Social History Main Topics  . Smoking status: Never Smoker   .  Smokeless tobacco: Never Used  . Alcohol Use: No  . Drug Use: No  . Sexual Activity: Not Currently   Other Topics Concern  . None   Social History Narrative   Additional Social History:    Allergies:  No Known Allergies  Labs: No results found for this or any previous visit (from the past 48 hour(s)).  Current Facility-Administered Medications  Medication Dose Route Frequency Provider Last Rate Last Dose  . acetaminophen (TYLENOL) tablet 650 mg  650 mg Oral Q6H PRN Lytle Butte, MD   650 mg at 10/04/15 1500   Or  . acetaminophen (TYLENOL) suppository 650 mg  650 mg Rectal Q6H  PRN Lytle Butte, MD      . ALPRAZolam Duanne Moron) tablet 1 mg  1 mg Oral TID PRN Epifanio Lesches, MD   1 mg at 10/04/15 0220  . alteplase (CATHFLO ACTIVASE) injection 2 mg  2 mg Intracatheter Once PRN Munsoor Lateef, MD      . aspirin EC tablet 81 mg  81 mg Oral Daily Lytle Butte, MD   81 mg at 10/04/15 1501  . carvedilol (COREG) tablet 12.5 mg  12.5 mg Oral BID WC Lytle Butte, MD   12.5 mg at 10/03/15 1716  . ceFAZolin (ANCEF) IVPB 1 g/50 mL premix  1 g Intravenous Q24H Adrian Prows, MD   1 g at 10/03/15 1715  . cloNIDine (CATAPRES) tablet 0.1 mg  0.1 mg Oral BID Dustin Flock, MD   0.1 mg at 10/04/15 1501  . epoetin alfa (EPOGEN,PROCRIT) injection 10,000 Units  10,000 Units Intravenous Q M,W,F-HD Lavonia Dana, MD   10,000 Units at 10/04/15 1025  . feeding supplement (NEPRO CARB STEADY) liquid 237 mL  237 mL Oral TID WC Harmeet Singh, MD   237 mL at 10/03/15 1510  . heparin injection 5,000 Units  5,000 Units Subcutaneous 3 times per day [name], MD   5,000 Units at 10/04/15 1400  . hydrALAZINE (APRESOLINE) injection 10 mg  10 mg Intravenous Q4H PRN Lytle Butte, MD      . hydrALAZINE (APRESOLINE) tablet 25 mg  25 mg Oral 3 times per day Dustin Flock, MD   25 mg at 10/04/15 1501  . lidocaine (PF) (XYLOCAINE) 1 % injection 5 mL  5 mL Intradermal PRN Munsoor Lateef, MD      . lidocaine-prilocaine (EMLA) cream 1 application  1 application Topical PRN Munsoor Lateef, MD      . morphine 2 MG/ML injection 2 mg  2 mg Intravenous Q4H PRN Lytle Butte, MD   2 mg at 10/04/15 0849  . nitroGLYCERIN (NITROSTAT) SL tablet 0.4 mg  0.4 mg Sublingual Q5 min PRN Epifanio Lesches, MD   0.4 mg at 09/28/15 0817  . ondansetron (ZOFRAN) tablet 4 mg  4 mg Oral Q6H PRN Lytle Butte, MD   4 mg at 09/25/15 1738   Or  . ondansetron [facility]) injection 4 mg  4 mg Intravenous Q6H PRN Lytle Butte, MD   4 mg at 09/28/15 1412  . oxyCODONE (Oxy IR/ROXICODONE) immediate release tablet 5 mg  5 mg Oral Q4H PRN  Lytle Butte, MD   5 mg at 10/03/15 2341  . pentafluoroprop-tetrafluoroeth (GEBAUERS) aerosol 1 application  1 application Topical PRN Munsoor Lateef, MD      . senna-docusate (Senokot-S) tablet 1 tablet  1 tablet Oral BID Max Sane, MD   1 tablet at 10/04/15 1501  . sodium chloride 0.9 % injection  3 mL  3 mL Intravenous PRN Lytle Butte, MD        Musculoskeletal: Strength & Muscle Tone: decreased Gait & Station: unable to stand Patient leans: N/A  Psychiatric Specialty Exam: Review of Systems  Constitutional: Positive for malaise/fatigue.  HENT: Negative.   Eyes: Negative.   Respiratory: Negative.   Cardiovascular: Negative.   Gastrointestinal: Positive for nausea.  Musculoskeletal: Positive for joint pain.  Skin: Negative.   Neurological: Positive for weakness.  Psychiatric/Behavioral: Positive for memory loss. Negative for depression, suicidal ideas, hallucinations and substance abuse. The patient is nervous/anxious. The patient does not have insomnia.     Blood pressure 135/80, pulse 88, temperature 98.5 F (36.9 C), temperature source Oral, resp. rate 14, height 6' (1.829 m), weight 108.8 kg (239 lb 13.8 oz), SpO2 97 %.Body mass index is 32.52 kg/(m^2).  General Appearance: Disheveled  Eye Contact::  None  Speech:  Slow and Slurred  Volume:  Decreased  Mood:  Dysphoric  Affect:  Depressed  Thought Process:  Linear  Orientation:  Full (Time, Place, and Person)  Thought Content:  Negative  Suicidal Thoughts:  Yes.  without intent/plan  Homicidal Thoughts:  No  Memory:  Negative  Judgement:  Impaired  Insight:  Shallow  Psychomotor Activity:  Psychomotor Retardation  Concentration:  Poor  Recall:  [place] of Knowledge:Fair  Language: Fair  Akathisia:  No  Handed:  Right  AIMS (if indicated):     Assets:  Desire for Improvement Housing Resilience Social Support  ADL's:  Impaired  Cognition: Impaired,  Mild  Sleep:      Treatment Plan Summary: Daily  contact with patient to assess and evaluate symptoms and progress in treatment, Medication management and Plan After coming back in talking with him some more today I can see possibly where the concern about capacity came from. Patient does have a developmental disability and has some cognitive impairments and typical of such people has a hard time reacting to stress and can retreat into somewhat childlike coping. He is having a hard time dealing with his physical symptoms. He is talking about day wishes to die. I would not necessarily call this suicidal thinking. He was able to tell me after several prompts what the basic idea of dialysis was any understood that without it he would get sicker. He would not articulate that without it he could die and so I made that clear to him. After talking with him today I think I will reinstate a mirtazapine that I had started as I doubt that that is part of why he is so lethargic and withdrawn. I will continue to follow-up regularly. As far as capacity, I would still want to know exactly what kind of capacity we were talking about. I would hold out hope that like other patients I work with who have dialysis and developmental disability that any resistance can usually be dealt with by gentle coaxing and negotiation rather than having to resort to any kind of force.  Disposition: Patient does not meet criteria for psychiatric inpatient admission. Supportive therapy provided about ongoing stressors.  Alethia Berthold, MD 10/04/2015 4:44 PM

## 2015-10-04 NOTE — Progress Notes (Signed)
Spoke with Dr Jannifer Franklin concerning patient's BP. Reported pt had dialysis today. MD order to not give scheduled Clonidine this evening. Reported to Dr Jannifer Franklin that nurse tried multiple times over the past hours for pt to take Lactulose. Pt refused at each suggestion, reports he doesn't like liquid medication, did take Senakot.Nausea improved after Zofran.  Dr Jannifer Franklin acknowledged.No further orders.

## 2015-10-04 NOTE — Progress Notes (Signed)
Post HD TX Assessment

## 2015-10-04 NOTE — Consult Note (Signed)
  Psychiatry: Follow-up for this 55 year old man who now has been set up for regular hemodialysis. I had initially been following him since last week for concerns about depression. A repeat consult was placed today questioning capacity. It was not specified what sort of capacity was meant.  Came by to see patient today. Spoke with his visitors briefly. Patient looked like he was asleep. When I spoke to him he muttered a brief reply but was unable to arouse himself to further conversation. Family told me that he had just gotten back from dialysis.  Ever since I first saw him and had started him on a low dose of mirtazapine, I have found that on almost all subsequent visits he appears very sleepy. Would not expect that degree of sedation later in the day from just 15 mg of mirtazapine at night but I'm going to discontinue it anyway. He does have Xanax ordered when necessary. I don't know that he's been getting that all that frequently either so it may not be the reason he is so tired. I really haven't been able to get a fair evaluation again about whether he is depressed.  Again, I am unclear what sort of capacity we are discussing. When I first saw him for the initial consult it was clear to me that he understood dialysis and the reason for the recommended treatment and surgery and appeared to be able to make reasonable decisions. Right now he is too tired to cooperate. I will try and contact members of the treatment team to clarify what is wanted and will follow-up.

## 2015-10-04 NOTE — Progress Notes (Signed)
HD TX Termination

## 2015-10-04 NOTE — Progress Notes (Signed)
Pre HD Tx Assessment 

## 2015-10-04 NOTE — Progress Notes (Signed)
[place] [facility] [place] NAME: Ronnie Keith    MR#:  [id number]  DATE OF BIRTH:  04-Oct-1960   The patient was asleep, tried to wake him up, but only answered limited questions.  REVIEW OF SYSTEMS:  CONSTITUTIONAL: No fever, but sleepy and weakness.  RESPIRATORY: No cough, shortness of breath, wheezing  CARDIOVASCULAR: No chest pain.  GASTROINTESTINAL: No nausea, vomiting, diarrhea or abdominal pain.  GENITOURINARY: No dysuria, hematuria.  MUSCULOSKELETAL: No joint pain.   NEUROLOGIC: No tingling, numbness, weakness. PSYCHIATRY: No anxiety or depression.   DRUG ALLERGIES:   No Known Allergies  VITALS:  Blood pressure 135/80, pulse 88, temperature 98.5 F (36.9 C), temperature source Oral, resp. rate 14, height 6' (1.829 m), weight 108.8 kg (239 lb 13.8 oz), SpO2 97 %. PHYSICAL EXAMINATION:   Physical Exam  GENERAL:  55 y.o.-year-old patient lying in the bed with no acute distress.  EYES: eyes closed.  HEENT: Head atraumatic, normocephalic.  NECK:  Supple, no jugular venous distention. No thyroid enlargement, no tenderness.  LUNGS: Normal breath sounds bilaterally, no wheezing, rales, rhonchi. No use of accessory muscles of respiration.  CARDIOVASCULAR: S1, S2 normal. No murmurs, rubs, or gallops.  ABDOMEN; bowel sounds present,no rebound tenderness, bowel sounds presentS: No cyanosis, clubbing , no edema.   NEUROLOGIC: unable to exam. PSYCHIATRIC: The patient was asleep.  SKIN: No obvious rash, lesion, or ulcer.   LABORATORY PANEL:  CBC  Recent Labs Lab 10/02/15 0937  WBC 4.9  HGB 9.4*  HCT 28.5*  PLT 127*    Chemistries   Recent Labs Lab 10/02/15 0937  NA 141  K 4.0  CL 104  CO2 33*  GLUCOSE 109*  BUN 41*  CREATININE 5.45*  CALCIUM 8.4*    Cardiac Enzymes  Recent Labs Lab 09/28/15 0621  TROPONINI 0.04*   RADIOLOGY:  No results found. ASSESSMENT AND PLAN:   55 year old Caucasian gentleman  history of essential hypertension is presenting with elevated blood pressure.  1. R arm thrombophlebitis: antecub pustule seen at old IV site  Blood cultures are  negative to date.had chest x-ray negative. He has MSSA. Continue IV Ancef to February 2 per Dr. Ola Keith.  2. Hypertensive urgency:Controlled with Coreg, clonidine and hydralazine  3. End-stage renal disease now on hemodialysis; status permacath placement. Removed Right IJ. Patient has no insurance, waiting for insurance approval for out  patient dialysis   4. Hyperkalemia: Received Kayexalate potassium,  improved  5.abdominal pain likely due to constipation: improved. Continue PPIs, stool softeners.  6.depression: Started on Remeron. appreciate psych following. Need reconsult for evaluation for capacity of making decision.   * Venous thromboembolism prophylactic: Heparin subcutaneous.  I discussed with Dr. Candiss Keith. Case discussed with Care Management/Social Worker. Management plans discussed with the patient, family and they are in agreement.  CODE STATUS: full  DVT Prophylaxis: heparin  TOTAL TIME TAKING CARE OF THIS PATIENT: 28 minutes.  Greater than 50% time was spent on coordination of care and face-to-face counseling.   POSSIBLE D/C IN ? DAYS, DEPENDING ON CLINICAL CONDITION.  Note: This dictation was prepared with Dragon dictation along with smaller phrase technology. Any transcriptional errors that result from this process are unintentional.  Ronnie Keith M.D on 10/04/2015 at 3:08 PM  Between 7am to 6pm - Pager - [phone]  After 6pm go to [email or web address] - password [email or web address] [facility]  [facility]  Office  [phone]  CC: Primary care physician; No primary care provider on file.

## 2015-10-04 NOTE — Progress Notes (Signed)
Computer best practices suggested Cardiac Monitoring. Spoke with Dr Jannifer Franklin concerning this. Patient VSS. No new orders. Will contact IT concerning this.

## 2015-10-04 NOTE — Progress Notes (Signed)
Pre HD Tx Machine & Pt Checks
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
## 2015-10-04 NOTE — Progress Notes (Signed)
HD Tx Initiation 

## 2015-10-04 NOTE — Care Management (Signed)
Have made attempts to speak with patient's mother to determine if there is a PCP so can set up home health nurse.  Have left a message with patient's sister in the home.  She says that patient's mother is currently at the hospital.  There are not visitors in patient's room.  This patient is in desparate need of home health follow up (SN and SW) to assure the mother can follow through with all needed to complete disability and medicaid applications and home health nurse to follow medical condition and compliance with new dialysis.  Maudie Mercury is still working on Lawyer

## 2015-10-04 NOTE — Progress Notes (Signed)
[facility]  ROUNDING NOTE   Subjective:   Patient seen during dialysis Tolerating well    HEMODIALYSIS FLOWSHEET:  Blood Flow Rate (mL/min): 200 mL/min Arterial Pressure (mmHg): -210 mmHg Venous Pressure (mmHg): 80 mmHg Transmembrane Pressure (mmHg): 40 mmHg Ultrafiltration Rate (mL/min): 510 mL/min Dialysate Flow Rate (mL/min): 800 ml/min Conductivity: Machine : 14 Conductivity: Machine : 14 Dialysis Fluid Bolus: Normal Saline Bolus Amount (mL): 250 mL Dialysate Change: 2K Intra-Hemodialysis Comments: Tx complete with 250NS rinseback.      Objective:  Vital signs in last 24 hours:  Temp:  [97.6 F (36.4 C)-98.5 F (36.9 C)] 98.5 F (36.9 C) (02/01 1343) Pulse Rate:  [69-91] 88 (02/01 1343) Resp:  [14-21] 14 (02/01 1343) BP: (103-156)/(51-92) 135/80 mmHg (02/01 1343) SpO2:  [95 %-100 %] 97 % (02/01 1343) Weight:  [108.8 kg (239 lb 13.8 oz)-111.5 kg (245 lb 13 oz)] 108.8 kg (239 lb 13.8 oz) (02/01 1300)  Weight change:  Filed Weights   10/02/15 0511 10/04/15 0915 10/04/15 1300  Weight: 114.488 kg (252 lb 6.4 oz) 111.5 kg (245 lb 13 oz) 108.8 kg (239 lb 13.8 oz)    Intake/Output: I/O last 3 completed shifts: In: 440 [P.O.:440] Out: 675 [Urine:675]   Intake/Output this shift:  Total I/O In: -  Out: 600 [Other:600]  Physical Exam: General: NAD, laying in bed.   Head: Normocephalic, atraumatic. Moist oral mucosal membranes  Eyes: Anicteric,    Neck: Supple, trachea midline  Lungs:  Clear to auscultation normal effort  Heart: Regular rate and rhythm  Abdomen:  Soft, nontender, BS present, distended  Extremities: No  peripheral edema.  Neurologic: Nonfocal, moving all four extremities  Skin: No lesions  Access: RIJ permcath 09/29/15 Dr. Delana Meyer    Basic Metabolic Panel:  Recent Labs Lab 09/29/15 0921 10/02/15 0937  NA 139 141  K 4.3 4.0  CL 103 104  CO2 28 33*  GLUCOSE 86 109*  BUN 46* 41*  CREATININE 5.52* 5.45*  CALCIUM 8.1*  8.4*  PHOS 4.1 3.5    Liver Function Tests:  Recent Labs Lab 09/29/15 0921 10/02/15 0937  ALBUMIN 2.8* 2.7*   No results for input(s): LIPASE, AMYLASE in the last 168 hours. No results for input(s): AMMONIA in the last 168 hours.  CBC:  Recent Labs Lab 09/28/15 0621 09/29/15 1755 10/02/15 0937  WBC 5.2 5.5 4.9  HGB 9.2* 9.3* 9.4*  HCT 27.7* 28.3* 28.5*  MCV 89.9 88.6 90.0  PLT 79* 100* 127*    Cardiac Enzymes:  Recent Labs Lab 09/28/15 0621  TROPONINI 0.04*    BNP: Invalid input(s): POCBNP  CBG: No results for input(s): GLUCAP in the last 168 hours.  Microbiology: Results for orders placed or performed during the hospital encounter of 09/19/15  CULTURE, BLOOD (ROUTINE X 2) w Reflex to PCR ID Panel     Status: None   Collection Time: 09/25/15 11:08 AM  Result Value Ref Range Status   Specimen Description BLOOD LEFT HAND  Final   Special Requests BOTTLES DRAWN AEROBIC AND ANAEROBIC [place]  Final   Culture NO GROWTH 5 DAYS  Final   Report Status 09/30/2015 FINAL  Final  CULTURE, BLOOD (ROUTINE X 2) w Reflex to PCR ID Panel     Status: None   Collection Time: 09/25/15  3:41 PM  Result Value Ref Range Status   Specimen Description BLOOD LINE  Final   Special Requests   Final    BOTTLES DRAWN AEROBIC AND ANAEROBIC AERO 8CC ANA  14CC   Culture NO GROWTH 5 DAYS  Final   Report Status 09/30/2015 FINAL  Final  Wound culture     Status: None   Collection Time: 09/26/15  3:02 PM  Result Value Ref Range Status   Specimen Description WOUND  Final   Special Requests Normal  Final   Gram Stain RARE WBC SEEN NO ORGANISMS SEEN   Final   Culture LIGHT GROWTH STAPHYLOCOCCUS AUREUS  Final   Report Status 09/29/2015 FINAL  Final   Organism ID, Bacteria STAPHYLOCOCCUS AUREUS  Final      Susceptibility   Staphylococcus aureus - MIC*    OXACILLIN Value in next row Sensitive      SENSITIVE<=0.25    GENTAMICIN Value in next row Sensitive      SENSITIVE<=0.5     CIPROFLOXACIN Value in next row Sensitive      SENSITIVE<=0.5    ERYTHROMYCIN Value in next row Sensitive      SENSITIVE<=0.25    CLINDAMYCIN Value in next row Sensitive      SENSITIVE<=0.25    VANCOMYCIN Value in next row Sensitive      SENSITIVE1    TETRACYCLINE Value in next row Sensitive      SENSITIVE<=1    RIFAMPIN Value in next row Sensitive      SENSITIVE<=0.5    TRIMETH/SULFA Value in next row Sensitive      SENSITIVE<=10    * LIGHT GROWTH STAPHYLOCOCCUS AUREUS    Coagulation Studies: No results for input(s): LABPROT, INR in the last 72 hours.  Urinalysis: No results for input(s): COLORURINE, LABSPEC, PHURINE, GLUCOSEU, HGBUR, BILIRUBINUR, KETONESUR, PROTEINUR, UROBILINOGEN, NITRITE, LEUKOCYTESUR in the last 72 hours.  Invalid input(s): APPERANCEUR    Imaging: No results found.   Medications:     . aspirin EC  81 mg Oral Daily  . carvedilol  12.5 mg Oral BID WC  .  ceFAZolin (ANCEF) IV  1 g Intravenous Q24H  . cloNIDine  0.1 mg Oral BID  . epoetin (EPOGEN/PROCRIT) injection  10,000 Units Intravenous Q M,W,F-HD  . feeding supplement (NEPRO CARB STEADY)  237 mL Oral TID WC  . heparin subcutaneous  5,000 Units Subcutaneous 3 times per day  . hydrALAZINE  25 mg Oral 3 times per day  . senna-docusate  1 tablet Oral BID   acetaminophen **OR** acetaminophen, ALPRAZolam, alteplase, hydrALAZINE, lidocaine (PF), lidocaine-prilocaine, morphine injection, nitroGLYCERIN, ondansetron **OR** ondansetron (ZOFRAN) IV, oxyCODONE, pentafluoroprop-tetrafluoroeth, sodium chloride  Assessment/ Plan:  55 y.o.white male  with hypertension, chronic low back pain, scoliosis, anxiety, Down's Baseline Cr 3.4 who was admitted to [facility] on 09/19/2015 for evaluation of uncontrolled hypertension.   1. End Stage Renal Disease: completed Four treatments of hemodialysis.  Baseline creatinine 3.4 on 12/28/14.  Etiology of ESRD unclear but most likely secondary to uncontrolled HTN and  focal  glomeruloscerlosis with significant proteinuria. Serologic work up negative.   -Patient had first hemodialysis treatment 09/23/15. - Working on discharge dialysis planning. Family prefers [place]. Dialysis d/c planning as per dialysis liason  2. Anemia chronic kidney disease. Hgb 9.4 - epo with dialysis treatment.   3. Secondary hyperparathyroidism. PTH 411, phos 3.5 - not currently on a binder or vitamin D agent.   4. Depression - no agitation today during HD      LOS: 14 Glendi Mohiuddin 2/1/20173:28 PM

## 2015-10-04 NOTE — Plan of Care (Signed)
Problem: Nutrition: Goal: Adequate nutrition will be maintained Outcome: Progressing Pt had pizza for supper.

## 2015-10-04 NOTE — Progress Notes (Signed)
ANTIBIOTIC CONSULT NOTE - Follow Up  Pharmacy Consult for Cefazolin Indication: Wound infection  No Known Allergies  Patient Measurements: Height: 6' (182.9 cm) Weight: 239 lb 13.8 oz (108.8 kg) IBW/kg (Calculated) : 77.6  Vital Signs: Temp: 98.5 F (36.9 C) (02/01 1343) Temp Source: Oral (02/01 1343) BP: 135/80 mmHg (02/01 1343) Pulse Rate: 88 (02/01 1343) Intake/Output from previous day: 01/31 0701 - 02/01 0700 In: 440 [P.O.:440] Out: 300 [Urine:300] Intake/Output from this shift: Total I/O In: -  Out: 600 [Other:600]  Labs:  Recent Labs  10/02/15 0937  WBC 4.9  HGB 9.4*  PLT 127*  CREATININE 5.45*   Estimated Creatinine Clearance: 20.2 mL/min (by C-G formula based on Cr of 5.45). No results for input(s): VANCOTROUGH, VANCOPEAK, VANCORANDOM, GENTTROUGH, GENTPEAK, GENTRANDOM, TOBRATROUGH, TOBRAPEAK, TOBRARND, AMIKACINPEAK, AMIKACINTROU, AMIKACIN in the last 72 hours.   Microbiology: Recent Results (from the past 720 hour(s))  CULTURE, BLOOD (ROUTINE X 2) w Reflex to PCR ID Panel     Status: None   Collection Time: 09/25/15 11:08 AM  Result Value Ref Range Status   Specimen Description BLOOD LEFT HAND  Final   Special Requests BOTTLES DRAWN AEROBIC AND ANAEROBIC [place]  Final   Culture NO GROWTH 5 DAYS  Final   Report Status 09/30/2015 FINAL  Final  CULTURE, BLOOD (ROUTINE X 2) w Reflex to PCR ID Panel     Status: None   Collection Time: 09/25/15  3:41 PM  Result Value Ref Range Status   Specimen Description BLOOD LINE  Final   Special Requests   Final    BOTTLES DRAWN AEROBIC AND ANAEROBIC AERO 8CC ANA 14CC   Culture NO GROWTH 5 DAYS  Final   Report Status 09/30/2015 FINAL  Final  Wound culture     Status: None   Collection Time: 09/26/15  3:02 PM  Result Value Ref Range Status   Specimen Description WOUND  Final   Special Requests Normal  Final   Gram Stain RARE WBC SEEN NO ORGANISMS SEEN   Final   Culture LIGHT GROWTH STAPHYLOCOCCUS AUREUS   Final   Report Status 09/29/2015 FINAL  Final   Organism ID, Bacteria STAPHYLOCOCCUS AUREUS  Final      Susceptibility   Staphylococcus aureus - MIC*    OXACILLIN Value in next row Sensitive      SENSITIVE<=0.25    GENTAMICIN Value in next row Sensitive      SENSITIVE<=0.5    CIPROFLOXACIN Value in next row Sensitive      SENSITIVE<=0.5    ERYTHROMYCIN Value in next row Sensitive      SENSITIVE<=0.25    CLINDAMYCIN Value in next row Sensitive      SENSITIVE<=0.25    VANCOMYCIN Value in next row Sensitive      SENSITIVE1    TETRACYCLINE Value in next row Sensitive      SENSITIVE<=1    RIFAMPIN Value in next row Sensitive      SENSITIVE<=0.5    TRIMETH/SULFA Value in next row Sensitive      SENSITIVE<=10    * LIGHT GROWTH STAPHYLOCOCCUS AUREUS    Medical History: Past Medical History  Diagnosis Date  . Hypertension   . Chronic low back pain   . Scoliosis   . Anxiety   . Sciatica     Medications:  Scheduled:  . aspirin EC  81 mg Oral Daily  . carvedilol  12.5 mg Oral BID WC  .  ceFAZolin (ANCEF) IV  1 g Intravenous Q24H  .  cloNIDine  0.1 mg Oral BID  . epoetin (EPOGEN/PROCRIT) injection  10,000 Units Intravenous Q M,W,F-HD  . feeding supplement (NEPRO CARB STEADY)  237 mL Oral TID WC  . heparin subcutaneous  5,000 Units Subcutaneous 3 times per day  . hydrALAZINE  25 mg Oral 3 times per day  . senna-docusate  1 tablet Oral BID   Assessment: Pharmacy consulted to dose Cefazolin in a 55 yo male with right arm thrombophlebitis.  Patient with ESRD requiring HD.  Wound Cultures growing oxacillin sensitive staph aureus. Pt currently on Cefazolin 1g after HD.  Goal of Therapy:  Resolution of infection  Plan:  Continue cefazolin 1g after HD on HD days. Per Dr. Sherrilyn Rist d/c on 2/2 if site appears healed   Pharmacy will continue to follow.    Cable Fearn D Dynasty Holquin 10/04/2015,3:38 PM

## 2015-10-04 NOTE — Progress Notes (Signed)
Assessed removal of Trialysis catheter as pt now has permanent site for dialysis.  Per Dr. Mallie Snooks and Dr. Bridgett Larsson, pt can potentially be switched to oral abx on 10/05/15.  Will f/u tomorrow for possible d/c of trialysis catheter.

## 2015-10-05 LAB — CBC
HEMATOCRIT: 30.2 % — AB (ref 40.0–52.0)
Hemoglobin: 10.1 g/dL — ABNORMAL LOW (ref 13.0–18.0)
MCH: 29.6 pg (ref 26.0–34.0)
MCHC: 33.4 g/dL (ref 32.0–36.0)
MCV: 88.4 fL (ref 80.0–100.0)
Platelets: 106 10*3/uL — ABNORMAL LOW (ref 150–440)
RBC: 3.41 MIL/uL — ABNORMAL LOW (ref 4.40–5.90)
RDW: 13.5 % (ref 11.5–14.5)
WBC: 6.2 10*3/uL (ref 3.8–10.6)

## 2015-10-05 MED ORDER — MIRTAZAPINE 30 MG PO TABS
30.0000 mg | ORAL_TABLET | Freq: Every day | ORAL | Status: DC
  Administered 2015-10-06 (×2): 30 mg via ORAL
  Filled 2015-10-05 (×2): qty 1

## 2015-10-05 NOTE — Care Management (Signed)
Patient will need home health nursing and SW at time of discharge.  Joni Reining RNCM confirmed that Dr. Candiss Norse is willing to sign home health orders for the patient.  I have set the patient up with a new patient follow up appointment at [facility] center on 10/11/15 at 10:40 am.  Patient and mother will be updated with this information.  Spoke with [facility] S[facility] HD liason and patient has an outpatient seat at [facility] on Tuesday, Thursday, Saturday. Will Arrange home health at time of discharge

## 2015-10-05 NOTE — Care Management Note (Signed)
Outpatient dialysis placement is complete and patient can start  Tuesday 10-10-15.  Patients mother selected [facility].  Clinic was able to accept patient with no insurance.  They will begin patients [id number] which will start the Medicare coverage process.  Please notify me when patient is ready to discharge so that I may update clinic and get exact time of first treatment.   [facility] S[facility] Dialysis Liaison  [phone]

## 2015-10-05 NOTE — Progress Notes (Signed)
[facility] [facility] [place] NAME: Ronnie Keith    MR#:  [id number]  DATE OF BIRTH:  April 28, 1961  SUBJECTIVE:  CHIEF COMPLAINT:   Chief Complaint  Patient presents with  . Hypertension   - Patient is more alert today, sitting in the chair. 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 - has both permacath and also temporary dialysis catheter - Aunt at bedside. Awaiting for outpatient dialysis set up.  REVIEW OF SYSTEMS:  Review of Systems  Constitutional: Positive for malaise/fatigue. Negative for fever and chills.  HENT: Negative for ear discharge.   Eyes: Negative for blurred vision.  Respiratory: Negative for cough, shortness of breath and wheezing.   Cardiovascular: Negative for chest pain and palpitations.  Gastrointestinal: Positive for nausea and abdominal pain. Negative for vomiting, diarrhea and constipation.  Genitourinary: Negative for dysuria.  Musculoskeletal: Negative for myalgias.  Neurological: Negative for dizziness, seizures and headaches.  Psychiatric/Behavioral: Negative for depression.    DRUG ALLERGIES:  No Known Allergies  VITALS:  Blood pressure 103/55, pulse 79, temperature 97.5 F (36.4 C), temperature source Oral, resp. rate 18, height 6' (1.829 m), weight 108.8 kg (239 lb 13.8 oz), SpO2 97 %.  PHYSICAL EXAMINATION:  Physical Exam  GENERAL:  55 y.o.-year-old obese patient lying in the bed with no acute distress.  EYES: Pupils equal, round, reactive to light and accommodation. No scleral icterus. Extraocular muscles intact.  HEENT: Head atraumatic, normocephalic. Oropharynx and nasopharynx clear.  NECK:  Supple, no jugular venous distention. No thyroid enlargement, no tenderness.  LUNGS: Normal breath sounds bilaterally, no wheezing, rales,rhonchi or crepitation. No use of accessory muscles of respiration.  CARDIOVASCULAR: S1, S2 normal. No murmurs, rubs, or gallops.  ABDOMEN: Soft, nontender, nondistended.  Bowel sounds present. No organomegaly or mass.  EXTREMITIES: No pedal edema, cyanosis, or clubbing.  NEUROLOGIC: Cranial nerves II through XII are intact. Muscle strength 5/5 in all extremities. Sensation intact. Gait not checked.  PSYCHIATRIC: The patient is alert and oriented to self.  SKIN: No obvious rash, lesion, or ulcer.    LABORATORY PANEL:   CBC  Recent Labs Lab 10/05/15 0450  WBC 6.2  HGB 10.1*  HCT 30.2*  PLT 106*   ------------------------------------------------------------------------------------------------------------------  Chemistries   Recent Labs Lab 10/02/15 0937  NA 141  K 4.0  CL 104  CO2 33*  GLUCOSE 109*  BUN 41*  CREATININE 5.45*  CALCIUM 8.4*   ------------------------------------------------------------------------------------------------------------------  Cardiac Enzymes No results for input(s): TROPONINI in the last 168 hours. ------------------------------------------------------------------------------------------------------------------  RADIOLOGY:  No results found.  EKG:   Orders placed or performed during the hospital encounter of 09/19/15  . ED EKG  . ED EKG  . EKG 12-Lead  . EKG 12-Lead  . EKG 12-Lead  . EKG 12-Lead    ASSESSMENT AND PLAN:   55 year old Caucasian gentleman history of essential hypertension is presenting with elevated blood pressure.  1. R arm thrombophlebitis: He has MSSA. On IV Ancef - will be done today.  2. Hypertensive urgency:Controlled with Coreg, clonidine and hydralazine  3. End-stage renal disease now on hemodialysis; status permacath placement. Remove Right IJ. Patient has no insurance, waiting for setting up outpatient dialysis  4. Hyperkalemia: Received Kayexalate potassium, improved  5.abdominal pain likely due to constipation: improved. Continue PPIs, stool softeners.  6.depression: Started on Remeron. appreciate psych following.   7. Venous thromboembolism prophylactic:  Heparin subcutaneous.    All the records are reviewed and case discussed with Care Management/Social Workerr. Management plans  discussed with the patient, family and they are in agreement.  CODE STATUS: Full Code  TOTAL TIME TAKING CARE OF THIS PATIENT: 40 minutes.   POSSIBLE D/C IN 1-2 DAYS, DEPENDING ON CLINICAL CONDITION.   Gladstone Lighter M.D on 10/05/2015 at 3:40 PM  Between 7am to 6pm - Pager - [phone]  After 6pm go to [email or web address] - password [email or web address] [facility]  [place] [facility]  Office  [phone]  CC: Primary care physician; No primary care provider on file.

## 2015-10-05 NOTE — Progress Notes (Signed)
[facility]  ROUNDING NOTE   Subjective:   doing fair today Very sleepy when seen Denies any acute complaints     Objective:  Vital signs in last 24 hours:  Temp:  [97.5 F (36.4 C)-98.3 F (36.8 C)] 97.5 F (36.4 C) (02/02 1238) Pulse Rate:  [70-79] 79 (02/02 1238) Resp:  [18] 18 (02/02 1238) BP: (91-130)/(44-64) 103/55 mmHg (02/02 1400) SpO2:  [96 %-98 %] 97 % (02/02 1238)  Weight change:  Filed Weights   10/02/15 0511 10/04/15 0915 10/04/15 1300  Weight: 114.488 kg (252 lb 6.4 oz) 111.5 kg (245 lb 13 oz) 108.8 kg (239 lb 13.8 oz)    Intake/Output: I/O last 3 completed shifts: In: 295 [P.O.:240; Other:10; IV Piggyback:45] Out: 950 [Urine:350; Other:600]   Intake/Output this shift:  Total I/O In: 440 [P.O.:440] Out: 250 [Urine:250]  Physical Exam: General: NAD, laying in bed.   Head: Normocephalic, atraumatic. Moist oral mucosal membranes  Eyes: Anicteric,    Neck: Supple, trachea midline  Lungs:  Clear to auscultation normal effort  Heart: Regular rate and rhythm  Abdomen:  Soft, nontender, BS present, distended  Extremities: No  peripheral edema.  Neurologic: Nonfocal, moving all four extremities  Skin: No lesions  Access: RIJ permcath 09/29/15 Dr. Delana Meyer    Basic Metabolic Panel:  Recent Labs Lab 09/29/15 0921 10/02/15 0937  NA 139 141  K 4.3 4.0  CL 103 104  CO2 28 33*  GLUCOSE 86 109*  BUN 46* 41*  CREATININE 5.52* 5.45*  CALCIUM 8.1* 8.4*  PHOS 4.1 3.5    Liver Function Tests:  Recent Labs Lab 09/29/15 0921 10/02/15 0937  ALBUMIN 2.8* 2.7*   No results for input(s): LIPASE, AMYLASE in the last 168 hours. No results for input(s): AMMONIA in the last 168 hours.  CBC:  Recent Labs Lab 09/29/15 1755 10/02/15 0937 10/05/15 0450  WBC 5.5 4.9 6.2  HGB 9.3* 9.4* 10.1*  HCT 28.3* 28.5* 30.2*  MCV 88.6 90.0 88.4  PLT 100* 127* 106*    Cardiac Enzymes: No results for input(s): CKTOTAL, CKMB, CKMBINDEX, TROPONINI  in the last 168 hours.  BNP: Invalid input(s): POCBNP  CBG: No results for input(s): GLUCAP in the last 168 hours.  Microbiology: Results for orders placed or performed during the hospital encounter of 09/19/15  CULTURE, BLOOD (ROUTINE X 2) w Reflex to PCR ID Panel     Status: None   Collection Time: 09/25/15 11:08 AM  Result Value Ref Range Status   Specimen Description BLOOD LEFT HAND  Final   Special Requests BOTTLES DRAWN AEROBIC AND ANAEROBIC [place]  Final   Culture NO GROWTH 5 DAYS  Final   Report Status 09/30/2015 FINAL  Final  CULTURE, BLOOD (ROUTINE X 2) w Reflex to PCR ID Panel     Status: None   Collection Time: 09/25/15  3:41 PM  Result Value Ref Range Status   Specimen Description BLOOD LINE  Final   Special Requests   Final    BOTTLES DRAWN AEROBIC AND ANAEROBIC AERO 8CC ANA 14CC   Culture NO GROWTH 5 DAYS  Final   Report Status 09/30/2015 FINAL  Final  Wound culture     Status: None   Collection Time: 09/26/15  3:02 PM  Result Value Ref Range Status   Specimen Description WOUND  Final   Special Requests Normal  Final   Gram Stain RARE WBC SEEN NO ORGANISMS SEEN   Final   Culture LIGHT GROWTH STAPHYLOCOCCUS AUREUS  Final  Report Status 09/29/2015 FINAL  Final   Organism ID, Bacteria STAPHYLOCOCCUS AUREUS  Final      Susceptibility   Staphylococcus aureus - MIC*    OXACILLIN Value in next row Sensitive      SENSITIVE<=0.25    GENTAMICIN Value in next row Sensitive      SENSITIVE<=0.5    CIPROFLOXACIN Value in next row Sensitive      SENSITIVE<=0.5    ERYTHROMYCIN Value in next row Sensitive      SENSITIVE<=0.25    CLINDAMYCIN Value in next row Sensitive      SENSITIVE<=0.25    VANCOMYCIN Value in next row Sensitive      SENSITIVE1    TETRACYCLINE Value in next row Sensitive      SENSITIVE<=1    RIFAMPIN Value in next row Sensitive      SENSITIVE<=0.5    TRIMETH/SULFA Value in next row Sensitive      SENSITIVE<=10    * LIGHT GROWTH  STAPHYLOCOCCUS AUREUS    Coagulation Studies: No results for input(s): LABPROT, INR in the last 72 hours.  Urinalysis: No results for input(s): COLORURINE, LABSPEC, PHURINE, GLUCOSEU, HGBUR, BILIRUBINUR, KETONESUR, PROTEINUR, UROBILINOGEN, NITRITE, LEUKOCYTESUR in the last 72 hours.  Invalid input(s): APPERANCEUR    Imaging: No results found.   Medications:     . aspirin EC  81 mg Oral Daily  . carvedilol  12.5 mg Oral BID WC  .  ceFAZolin (ANCEF) IV  1 g Intravenous Q24H  . cloNIDine  0.1 mg Oral BID  . epoetin (EPOGEN/PROCRIT) injection  10,000 Units Intravenous Q M,W,F-HD  . feeding supplement (NEPRO CARB STEADY)  237 mL Oral TID WC  . heparin subcutaneous  5,000 Units Subcutaneous 3 times per day  . hydrALAZINE  25 mg Oral 3 times per day  . mirtazapine  30 mg Oral QHS  . senna-docusate  1 tablet Oral BID   acetaminophen **OR** acetaminophen, ALPRAZolam, alteplase, hydrALAZINE, lactulose, lidocaine (PF), lidocaine-prilocaine, morphine injection, nitroGLYCERIN, ondansetron **OR** ondansetron (ZOFRAN) IV, oxyCODONE, pentafluoroprop-tetrafluoroeth, sodium chloride  Assessment/ Plan:  55 y.o.white male  with hypertension, chronic low back pain, scoliosis, anxiety, Down's Baseline Cr 3.4 who was admitted to [facility] on 09/19/2015 for evaluation of uncontrolled hypertension.   1. End Stage Renal Disease: completed Four treatments of hemodialysis.  Baseline creatinine 3.4 on 12/28/14.  Etiology of ESRD unclear but most likely secondary to uncontrolled HTN and  focal glomeruloscerlosis with significant proteinuria. Serologic work up negative.   -Patient had first hemodialysis treatment 09/23/15. - Working on discharge dialysis planning. Family prefers [place]. Dialysis d/c planning as per dialysis liason - has received confirmation e-mail that he can start at [facility] unit on Tuesday, February 7  2. Anemia chronic kidney disease. Hgb 10.1 - epo with dialysis treatment.   3.  Secondary hyperparathyroidism. PTH 411, phos 3.5 - not currently on a binder or vitamin D agent.   4. Depression - appreciate psychiatric evaluation      LOS: [place] 2/2/20173:43 PM

## 2015-10-05 NOTE — Consult Note (Signed)
[place] [facility] Consult   Reason for Consult:  Follow-up for this 55 year old man with a case of developmental disability currently transitioning into dialysis concern about depression also now a concern about capacity Referring Physician:  Tressia Miners Patient Identification: Ronnie Keith MRN:  [id number] Principal Diagnosis: Depression, major, recurrent, moderate ([place]) Diagnosis:   Patient Active Problem List   Diagnosis Date Noted  . Depression, major, recurrent, moderate ([place]) [F33.1] 09/28/2015  . Adjustment disorder with mixed anxiety and depressed mood [F43.23] 09/28/2015  . Hypertensive urgency [I16.0] 09/20/2015  . Acute kidney injury ([place]) [N17.9] 09/20/2015  . Hyperkalemia [E87.5] 09/20/2015    Total Time spent with patient: 30 minutes  Subjective:   Ronnie Keith is a 55 y.o. male patient admitted with "I don't know, I just don't feel good".  HPI:  Patient interviewed. Chart reviewed. Labs reviewed. Vital signs reviewed. This 55 year old man who I've been seeing for several days now is transitioning into hemodialysis. Today he is up and out of bed and is awake but still complains of being tired. Says he didn't sleep well last night. Patient is somewhat nonspecific in his complaints but talks about how he just doesn't feel good. He doesn't elaborate very much about it. Didn't make much eye contact with me. Admitted that he's just feeling generally negative but denies that he's having any suicidal thoughts today. Won't go into any more detail about it. He is having pretty typical physical complaints given his recent illness.  Social history: Mother and aunt have been here keeping an eye on him. They seem to watch over him closely but they don't seem to interact all that much from what I can tell.  Medical history: Patient has been transitioning into hemodialysis from renal disease possibly related to chronic high blood pressure. Also could be in part related to his underlying  genetic condition which I suspect is Down syndrome.  Past Psychiatric History: Patient doesn't have a real past psychiatric history that is identified other than his developmental disability  Risk to Self: Is patient at risk for suicide?: No Risk to Others:   Prior Inpatient Therapy:   Prior Outpatient Therapy:    Past Medical History:  Past Medical History  Diagnosis Date  . Hypertension   . Chronic low back pain   . Scoliosis   . Anxiety   . Sciatica    History reviewed. No pertinent past surgical history. Family History:  Family History  Problem Relation Age of Onset  . Hypertension Other    Family Psychiatric  History: Nonidentified is any family history Social History:  History  Alcohol Use No     History  Drug Use No    Social History   Social History  . Marital Status: Single    Spouse Name: N/A  . Number of Children: N/A  . Years of Education: N/A   Social History Main Topics  . Smoking status: Never Smoker   . Smokeless tobacco: Never Used  . Alcohol Use: No  . Drug Use: No  . Sexual Activity: Not Currently   Other Topics Concern  . None   Social History Narrative   Additional Social History:    Allergies:  No Known Allergies  Labs:  Results for orders placed or performed during the hospital encounter of 09/19/15 (from the past 48 hour(s))  CBC     Status: Abnormal   Collection Time: 10/05/15  4:50 AM  Result Value Ref Range   WBC 6.2 3.8 - 10.6  K/uL   RBC 3.41 (L) 4.40 - 5.90 MIL/uL   Hemoglobin 10.1 (L) 13.0 - 18.0 g/dL   HCT 30.2 (L) 40.0 - 52.0 %   MCV 88.4 80.0 - 100.0 fL   MCH 29.6 26.0 - 34.0 pg   MCHC 33.4 32.0 - 36.0 g/dL   RDW 13.5 11.5 - 14.5 %   Platelets 106 (L) 150 - 440 K/uL    Current Facility-Administered Medications  Medication Dose Route Frequency Provider Last Rate Last Dose  . acetaminophen (TYLENOL) tablet 650 mg  650 mg Oral Q6H PRN Lytle Butte, MD   650 mg at 10/04/15 1500   Or  . acetaminophen (TYLENOL)  suppository 650 mg  650 mg Rectal Q6H PRN Lytle Butte, MD      . ALPRAZolam Duanne Moron) tablet 1 mg  1 mg Oral TID PRN Epifanio Lesches, MD   1 mg at 10/04/15 0220  . alteplase (CATHFLO ACTIVASE) injection 2 mg  2 mg Intracatheter Once PRN Munsoor Lateef, MD      . aspirin EC tablet 81 mg  81 mg Oral Daily Lytle Butte, MD   81 mg at 10/05/15 1052  . carvedilol (COREG) tablet 12.5 mg  12.5 mg Oral BID WC Lytle Butte, MD   12.5 mg at 10/05/15 0825  . ceFAZolin (ANCEF) IVPB 1 g/50 mL premix  1 g Intravenous Q24H Adrian Prows, MD   1 g at 10/04/15 1755  . cloNIDine (CATAPRES) tablet 0.1 mg  0.1 mg Oral BID Dustin Flock, MD   0.1 mg at 10/05/15 1053  . epoetin alfa (EPOGEN,PROCRIT) injection 10,000 Units  10,000 Units Intravenous Q M,W,F-HD Lavonia Dana, MD   10,000 Units at 10/04/15 1025  . feeding supplement (NEPRO CARB STEADY) liquid 237 mL  237 mL Oral TID WC Harmeet Singh, MD   237 mL at 10/05/15 0800  . heparin injection 5,000 Units  5,000 Units Subcutaneous 3 times per day [name], MD   5,000 Units at 10/05/15 0449  . hydrALAZINE (APRESOLINE) injection 10 mg  10 mg Intravenous Q4H PRN Lytle Butte, MD      . hydrALAZINE (APRESOLINE) tablet 25 mg  25 mg Oral 3 times per day Dustin Flock, MD   25 mg at 10/04/15 1501  . lactulose (CHRONULAC) 10 GM/15ML solution 30 g  30 g Oral BID PRN Murlean Iba, MD   30 g at 10/04/15 2318  . lidocaine (PF) (XYLOCAINE) 1 % injection 5 mL  5 mL Intradermal PRN Munsoor Lateef, MD      . lidocaine-prilocaine (EMLA) cream 1 application  1 application Topical PRN Munsoor Lateef, MD      . mirtazapine (REMERON) tablet 30 mg  30 mg Oral QHS Gonzella Lex, MD      . morphine 2 MG/ML injection 2 mg  2 mg Intravenous Q4H PRN Lytle Butte, MD   2 mg at 10/05/15 0442  . nitroGLYCERIN (NITROSTAT) SL tablet 0.4 mg  0.4 mg Sublingual Q5 min PRN Epifanio Lesches, MD   0.4 mg at 09/28/15 0817  . ondansetron (ZOFRAN) tablet 4 mg  4 mg Oral Q6H PRN Lytle Butte, MD   4 mg at 09/25/15 1738   Or  . ondansetron [facility]) injection 4 mg  4 mg Intravenous Q6H PRN Lytle Butte, MD   4 mg at 10/05/15 0442  . oxyCODONE (Oxy IR/ROXICODONE) immediate release tablet 5 mg  5 mg Oral Q4H PRN Lytle Butte, MD  5 mg at 10/05/15 0055  . pentafluoroprop-tetrafluoroeth (GEBAUERS) aerosol 1 application  1 application Topical PRN Munsoor Lateef, MD      . senna-docusate (Senokot-S) tablet 1 tablet  1 tablet Oral BID Max Sane, MD   1 tablet at 10/05/15 1052  . sodium chloride 0.9 % injection 3 mL  3 mL Intravenous PRN Lytle Butte, MD   3 mL at 10/04/15 1928    Musculoskeletal: Strength & Muscle Tone: decreased Gait & Station: unsteady Patient leans: N/A  Psychiatric Specialty Exam: Review of Systems  Constitutional: Positive for malaise/fatigue.  HENT: Negative.   Eyes: Negative.   Respiratory: Negative.   Cardiovascular: Negative.   Gastrointestinal: Negative.   Musculoskeletal: Positive for myalgias.  Skin: Negative.   Neurological: Negative.   Psychiatric/Behavioral: Positive for depression. Negative for suicidal ideas, hallucinations, memory loss and substance abuse. The patient is nervous/anxious and has insomnia.     Blood pressure 108/63, pulse 79, temperature 97.5 F (36.4 C), temperature source Oral, resp. rate 18, height 6' (1.829 m), weight 108.8 kg (239 lb 13.8 oz), SpO2 97 %.Body mass index is 32.52 kg/(m^2).  General Appearance: Casual  Eye Contact::  Minimal  Speech:  Slow  Volume:  Decreased  Mood:  Dysphoric  Affect:  Depressed  Thought Process:  Circumstantial  Orientation:  Full (Time, Place, and Person)  Thought Content:  Negative  Suicidal Thoughts:  No  Homicidal Thoughts:  No  Memory:  Immediate;   Fair Recent;   Fair Remote;   Fair  Judgement:  Fair  Insight:  Fair  Psychomotor Activity:  Decreased  Concentration:  Fair  Recall:  [employer] of Knowledge:Fair  Language: Fair  Akathisia:  No  Handed:  Right   AIMS (if indicated):     Assets:  Desire for Improvement Housing Social Support  ADL's:  Intact  Cognition: Impaired,  Mild  Sleep:      Treatment Plan Summary: Medication management and Plan 55 year old man with major medical and social stresses. Underlying developmental disability. He is a little more talkative today than he was yesterday and is more able to acknowledge that he is feeling kind of depressed. As I think a mention before he has a sort of childlike interaction style pretty typical of someone with a mild degree of developmental disability like he has. As far as his depression I am going to not only restart the mirtazapine but put it to 30 mg. It has crossed my mind to consider starting a low dose of a stimulant given how unenergetic he is but I will hold off on that for now. Lots of encouragement and supportive counseling done. Follow-up regularly as needed. Education completed. As far as capacity I think he continues to show evidence of having capacity to make reasonable decisions it's just that when he is feeling frustrated he may not show it very well.  Disposition: Patient does not meet criteria for psychiatric inpatient admission. Supportive therapy provided about ongoing stressors.  Alethia Berthold, MD 10/05/2015 1:03 PM

## 2015-10-06 ENCOUNTER — Inpatient Hospital Stay: Payer: Self-pay

## 2015-10-06 LAB — BASIC METABOLIC PANEL
Anion gap: 8 (ref 5–15)
BUN: 39 mg/dL — AB (ref 6–20)
CO2: 28 mmol/L (ref 22–32)
CREATININE: 6.25 mg/dL — AB (ref 0.61–1.24)
Calcium: 8.5 mg/dL — ABNORMAL LOW (ref 8.9–10.3)
Chloride: 102 mmol/L (ref 101–111)
GFR calc Af Amer: 11 mL/min — ABNORMAL LOW (ref >60)
GFR, EST NON AFRICAN AMERICAN: 9 mL/min — AB (ref >60)
Glucose, Bld: 92 mg/dL (ref 65–99)
Potassium: 4.7 mmol/L (ref 3.5–5.1)
SODIUM: 138 mmol/L (ref 135–145)

## 2015-10-06 LAB — CBC
HCT: 28.9 % — ABNORMAL LOW (ref 40.0–52.0)
Hemoglobin: 9.4 g/dL — ABNORMAL LOW (ref 13.0–18.0)
MCH: 28.7 pg (ref 26.0–34.0)
MCHC: 32.5 g/dL (ref 32.0–36.0)
MCV: 88.4 fL (ref 80.0–100.0)
PLATELETS: 93 10*3/uL — AB (ref 150–440)
RBC: 3.26 MIL/uL — ABNORMAL LOW (ref 4.40–5.90)
RDW: 13.5 % (ref 11.5–14.5)
WBC: 4.9 10*3/uL (ref 3.8–10.6)

## 2015-10-06 LAB — PHOSPHORUS: PHOSPHORUS: 4.8 mg/dL — AB (ref 2.5–4.6)

## 2015-10-06 MED ORDER — PREDNISONE 50 MG PO TABS
60.0000 mg | ORAL_TABLET | Freq: Once | ORAL | Status: AC
  Administered 2015-10-06: 60 mg via ORAL
  Filled 2015-10-06: qty 1

## 2015-10-06 MED ORDER — POLYETHYLENE GLYCOL 3350 17 G PO PACK
17.0000 g | PACK | ORAL | Status: AC
  Administered 2015-10-06: 17 g via ORAL
  Filled 2015-10-06: qty 1

## 2015-10-06 MED ORDER — PREDNISONE 10 MG PO TABS: 10.0000 mg | ORAL_TABLET | Freq: Every day | ORAL | Status: DC

## 2015-10-06 NOTE — Care Management (Signed)
Patient will need an order for home health nursing.  PT is recommending SNF.  However patient is not insured, and would have to go to a SNF on a 30 LOG.  CSW has met with patient and this is not something that he is interested in. I was contacted by Corene Cornea at [facility] that the patient was declined for charity care.  I contacted Malachy Mood at [facility].  They are able to accept the patient for indigent care.  Initially patient will have to be order home health RN only, then they will add SW and PT at a later time.  PT recommending BSC, Walker, and WC.  Walker and BSC delivered to room.  Londell Moh from the charitable foundation is working providing [employer].  Weekend case manager will have to follow up  10/07/15 [phone].

## 2015-10-06 NOTE — Care Management (Signed)
Patient's mother Mrs Stpaul was provided with information on what she needs to take to Ronnie Keith new patient follow up at [facility] on 10/11/15.  Awaiting return call from [place] with [facility] care to see if patient qualifies for home health RN and SW through charity care.  PT consult pending

## 2015-10-06 NOTE — Evaluation (Signed)
Physical Therapy Evaluation Patient Details Name: Ronnie Keith MRN: [id number] DOB: 1961-04-15 Today's Date: 10/06/2015   History of Present Illness  Pt here reporting that he had not taken his meds in a while and also has some baseline MR and depression.   Clinical Impression  Pt shows good effort with PT eval and though he is able to do some limited ambulation he is not safe and ultimately would likely not be safe if he were to return home (like he and his mother would like).  Some of his issues come from L great toe pain making him hesitant to bear weight and needing to rely very heavily on the walker.  If he were to go home he would need a good amount of equipment and depending on his foot pain would have much limited mobility.    Follow Up Recommendations SNF    Equipment Recommendations  Rolling walker with 5" wheels;3in1 (PT);Wheelchair (measurements PT) (if pt does go home home)    Recommendations for Other Services       Precautions / Restrictions Precautions Precautions: Fall Restrictions Weight Bearing Restrictions: No      Mobility  Bed Mobility Overal bed mobility:  (pt in recliner on arrival, NT)                Transfers Overall transfer level: Needs assistance Equipment used: Rolling walker (2 wheeled) Transfers: Sit to/from Stand Sit to Stand: Mod assist;Min assist         General transfer comment: Pt shows great effort in getting to standing but relies very heavily on UEs to lift as well as needing assist to keep weight forward.  Ambulation/Gait Ambulation/Gait assistance: Mod assist Ambulation Distance (Feet): 10 Feet Assistive device: Rolling walker (2 wheeled)       General Gait Details: Pt with very slow, unsure, hesitant ambulation.  He is unable to bear weight well through the L LE secondary to pain and ultimately is not safe or steady with the effort. No true LOBs, but PT following closely with the walker secondary to is instability.     Stairs            Wheelchair Mobility    Modified Rankin (Stroke Patients Only)       Balance Overall balance assessment: Needs assistance             Standing balance comment: Pt unsteady in standing, appears to be impulsive, have issues with L foot pain and leans forward on walker numerous times in an unsafe way.                             Pertinent Vitals/Pain Pain Assessment:  (unrated, c/o significant pain at base of L great toe)    Home Living Family/patient expects to be discharged to:: Private residence Living Arrangements: Parent Available Help at Discharge: Family             Additional Comments: has use of a rollator    Prior Function           Comments: Prior to hospitalization pt was able to ambulate w/o AD and apparently was driving and able to work     Hand Dominance        Extremity/Trunk Assessment   Upper Extremity Assessment: Overall WFL for tasks assessed           Lower Extremity Assessment: Overall WFL for tasks assessed  Communication   Communication: No difficulties  Cognition Arousal/Alertness: Awake/alert Behavior During Therapy: Restless Overall Cognitive Status: History of cognitive impairments - at baseline                      General Comments      Exercises        Assessment/Plan    PT Assessment Patient needs continued PT services  PT Diagnosis Difficulty walking   PT Problem List Decreased strength;Decreased range of motion;Decreased activity tolerance;Decreased balance;Decreased mobility;Decreased knowledge of use of DME;Decreased safety awareness;Pain  PT Treatment Interventions Gait training;Therapeutic activities;Therapeutic exercise;DME instruction;Balance training;Functional mobility training   PT Goals (Current goals can be found in the Care Plan section) Acute Rehab PT Goals Patient Stated Goal: I would really rather go home it I can PT Goal Formulation:  With patient/family Time For Goal Achievement: 10/20/15 Potential to Achieve Goals: Fair    Frequency Min 2X/week   Barriers to discharge        Co-evaluation               End of Session Equipment Utilized During Treatment: Gait belt Activity Tolerance: Patient limited by pain Patient left: with chair alarm set;with call bell/phone within reach;with family/visitor present Nurse Communication: Mobility status         Time: 1515-1540 PT Time Calculation (min) (ACUTE ONLY): 25 min   Charges:   PT Evaluation $PT Eval Low Complexity: 1 Procedure     PT G Codes:       Ronnie Keith, PT, DPT [phone]  Ronnie Keith 10/06/2015, 5:34 PM

## 2015-10-06 NOTE — Progress Notes (Signed)
PT Cancellation Note  Patient Details Name: Ronnie Keith MRN: [id number] DOB: Oct 10, 1960   Cancelled Treatment:    Reason Eval/Treat Not Completed: Patient at procedure or test/unavailable (Pt off floor at dialysis.)  Will re-attempt PT eval at a later date/time.   [name] 10/06/2015, 10:48 AM Leitha Bleak, [place]

## 2015-10-06 NOTE — Progress Notes (Signed)
[facility]  ROUNDING NOTE   Subjective:   Patient seen during dialysis Tolerating well    HEMODIALYSIS FLOWSHEET:  Blood Flow Rate (mL/min): 400 mL/min Arterial Pressure (mmHg): -190 mmHg Venous Pressure (mmHg): 190 mmHg Transmembrane Pressure (mmHg): 50 mmHg Ultrafiltration Rate (mL/min): 290 mL/min Dialysate Flow Rate (mL/min): 600 ml/min Conductivity: Machine : 14 Conductivity: Machine : 14 Dialysis Fluid Bolus: Normal Saline Bolus Amount (mL): 250 mL Dialysate Change: 2K Intra-Hemodialysis Comments: 220ML (RESTING WITH EYES CLOSED)      Objective:  Vital signs in last 24 hours:  Temp:  [97.5 F (36.4 C)-98.2 F (36.8 C)] 98.2 F (36.8 C) (02/03 1000) Pulse Rate:  [66-79] 71 (02/03 0627) Resp:  [16-20] 16 (02/03 0627) BP: (103-112)/(55-68) 112/68 mmHg (02/03 0627) SpO2:  [92 %-97 %] 92 % (02/03 0627)  Weight change:  Filed Weights   10/02/15 0511 10/04/15 0915 10/04/15 1300  Weight: 114.488 kg (252 lb 6.4 oz) 111.5 kg (245 lb 13 oz) 108.8 kg (239 lb 13.8 oz)    Intake/Output: I/O last 3 completed shifts: In: 105 [P.O.:920; Other:10; IV Piggyback:90] Out: 775 [Urine:775]   Intake/Output this shift:     Physical Exam: General: NAD, laying in bed.   Head: Normocephalic, atraumatic. Moist oral mucosal membranes  Eyes: Anicteric,    Neck: Supple, trachea midline  Lungs:  Clear to auscultation normal effort  Heart: Regular rate and rhythm  Abdomen:  Soft, nontender, BS present, distended  Extremities: No  peripheral edema.  Neurologic: Nonfocal, moving all four extremities  Skin: No lesions  Access: RIJ permcath 09/29/15 Dr. Delana Meyer    Basic Metabolic Panel:  Recent Labs Lab 10/02/15 0937 10/06/15 0636  NA 141 138  K 4.0 4.7  CL 104 102  CO2 33* 28  GLUCOSE 109* 92  BUN 41* 39*  CREATININE 5.45* 6.25*  CALCIUM 8.4* 8.5*  PHOS 3.5 4.8*    Liver Function Tests:  Recent Labs Lab 10/02/15 0937  ALBUMIN 2.7*   No results  for input(s): LIPASE, AMYLASE in the last 168 hours. No results for input(s): AMMONIA in the last 168 hours.  CBC:  Recent Labs Lab 09/29/15 1755 10/02/15 0937 10/05/15 0450  WBC 5.5 4.9 6.2  HGB 9.3* 9.4* 10.1*  HCT 28.3* 28.5* 30.2*  MCV 88.6 90.0 88.4  PLT 100* 127* 106*    Cardiac Enzymes: No results for input(s): CKTOTAL, CKMB, CKMBINDEX, TROPONINI in the last 168 hours.  BNP: Invalid input(s): POCBNP  CBG: No results for input(s): GLUCAP in the last 168 hours.  Microbiology: Results for orders placed or performed during the hospital encounter of 09/19/15  CULTURE, BLOOD (ROUTINE X 2) w Reflex to PCR ID Panel     Status: None   Collection Time: 09/25/15 11:08 AM  Result Value Ref Range Status   Specimen Description BLOOD LEFT HAND  Final   Special Requests BOTTLES DRAWN AEROBIC AND ANAEROBIC [place]  Final   Culture NO GROWTH 5 DAYS  Final   Report Status 09/30/2015 FINAL  Final  CULTURE, BLOOD (ROUTINE X 2) w Reflex to PCR ID Panel     Status: None   Collection Time: 09/25/15  3:41 PM  Result Value Ref Range Status   Specimen Description BLOOD LINE  Final   Special Requests   Final    BOTTLES DRAWN AEROBIC AND ANAEROBIC AERO 8CC ANA 14CC   Culture NO GROWTH 5 DAYS  Final   Report Status 09/30/2015 FINAL  Final  Wound culture  Status: None   Collection Time: 09/26/15  3:02 PM  Result Value Ref Range Status   Specimen Description WOUND  Final   Special Requests Normal  Final   Gram Stain RARE WBC SEEN NO ORGANISMS SEEN   Final   Culture LIGHT GROWTH STAPHYLOCOCCUS AUREUS  Final   Report Status 09/29/2015 FINAL  Final   Organism ID, Bacteria STAPHYLOCOCCUS AUREUS  Final      Susceptibility   Staphylococcus aureus - MIC*    OXACILLIN Value in next row Sensitive      SENSITIVE<=0.25    GENTAMICIN Value in next row Sensitive      SENSITIVE<=0.5    CIPROFLOXACIN Value in next row Sensitive      SENSITIVE<=0.5    ERYTHROMYCIN Value in next row  Sensitive      SENSITIVE<=0.25    CLINDAMYCIN Value in next row Sensitive      SENSITIVE<=0.25    VANCOMYCIN Value in next row Sensitive      SENSITIVE1    TETRACYCLINE Value in next row Sensitive      SENSITIVE<=1    RIFAMPIN Value in next row Sensitive      SENSITIVE<=0.5    TRIMETH/SULFA Value in next row Sensitive      SENSITIVE<=10    * LIGHT GROWTH STAPHYLOCOCCUS AUREUS    Coagulation Studies: No results for input(s): LABPROT, INR in the last 72 hours.  Urinalysis: No results for input(s): COLORURINE, LABSPEC, PHURINE, GLUCOSEU, HGBUR, BILIRUBINUR, KETONESUR, PROTEINUR, UROBILINOGEN, NITRITE, LEUKOCYTESUR in the last 72 hours.  Invalid input(s): APPERANCEUR    Imaging: No results found.   Medications:     . aspirin EC  81 mg Oral Daily  . carvedilol  12.5 mg Oral BID WC  .  ceFAZolin (ANCEF) IV  1 g Intravenous Q24H  . cloNIDine  0.1 mg Oral BID  . epoetin (EPOGEN/PROCRIT) injection  10,000 Units Intravenous Q M,W,F-HD  . feeding supplement (NEPRO CARB STEADY)  237 mL Oral TID WC  . heparin subcutaneous  5,000 Units Subcutaneous 3 times per day  . hydrALAZINE  25 mg Oral 3 times per day  . mirtazapine  30 mg Oral QHS  . senna-docusate  1 tablet Oral BID   acetaminophen **OR** acetaminophen, ALPRAZolam, alteplase, hydrALAZINE, lactulose, lidocaine (PF), lidocaine-prilocaine, morphine injection, nitroGLYCERIN, ondansetron **OR** ondansetron (ZOFRAN) IV, oxyCODONE, pentafluoroprop-tetrafluoroeth, sodium chloride  Assessment/ Plan:  55 y.o.white male  with hypertension, chronic low back pain, scoliosis, anxiety, Down's Baseline Cr 3.4 who was admitted to [facility] on 09/19/2015 for evaluation of uncontrolled hypertension.   1. End Stage Renal Disease: completed Four treatments of hemodialysis.  Baseline creatinine 3.4 on 12/28/14.  Etiology of ESRD unclear but most likely secondary to uncontrolled HTN and  focal glomeruloscerlosis with significant proteinuria. Serologic  work up negative.   -Patient had first hemodialysis treatment 09/23/15. Shari Prows [facility] TTS  2. Anemia chronic kidney disease. Hgb 10.1 - epo with dialysis treatment.   3. Secondary hyperparathyroidism. PTH 411, phos 4.8 - not currently on a binder or vitamin D agent.   4. Depression - no agitation today during HD - Sitting up in chair - Appreciate psychiatric evaluation      LOS: 16 Mahala Rommel 2/3/201711:24 AM

## 2015-10-06 NOTE — Progress Notes (Signed)
[facility] [place] NAME: Ronnie Keith    MR#:  [id number]  DATE OF BIRTH:  [birth year]  SUBJECTIVE:  CHIEF COMPLAINT:   Chief Complaint  Patient presents with  . Hypertension   - Patient has been constipated. Complaints of abdominal discomfort and also bilateral feet pain. -For dialysis today. Has a spot for outpatient dialysis for this coming Tuesday.   REVIEW OF SYSTEMS:  Review of Systems  Constitutional: Positive for malaise/fatigue. Negative for fever and chills.  HENT: Negative for ear discharge.   Eyes: Negative for blurred vision.  Respiratory: Negative for cough, shortness of breath and wheezing.   Cardiovascular: Negative for chest pain and palpitations.  Gastrointestinal: Positive for abdominal pain. Negative for nausea, vomiting, diarrhea and constipation.       Abdominal discomfort  Genitourinary: Negative for dysuria.  Musculoskeletal: Negative for myalgias.       Bilateral feet pain  Neurological: Negative for dizziness, seizures and headaches.  Psychiatric/Behavioral: Negative for depression.    DRUG ALLERGIES:  No Known Allergies  VITALS:  Blood pressure 112/68, pulse 71, temperature 97.5 F (36.4 C), temperature source Oral, resp. rate 16, height 6' (1.829 m), weight 108.8 kg (239 lb 13.8 oz), SpO2 92 %.  PHYSICAL EXAMINATION:  Physical Exam  GENERAL:  55 y.o.-year-old obese patient lying in the bed with no acute distress.  EYES: Pupils equal, round, reactive to light and accommodation. No scleral icterus. Extraocular muscles intact.  HEENT: Head atraumatic, normocephalic. Oropharynx and nasopharynx clear.  NECK:  Supple, no jugular venous distention. No thyroid enlargement, no tenderness.  LUNGS: Normal breath sounds bilaterally, no wheezing, rales,rhonchi or crepitation. No use of accessory muscles of respiration.  CARDIOVASCULAR: S1, S2 normal. No murmurs, rubs, or gallops.  ABDOMEN: Soft, nontender,  nondistended. Bowel sounds present. No organomegaly or mass.  EXTREMITIES: No pedal edema, cyanosis, or clubbing.  NEUROLOGIC: Cranial nerves II through XII are intact. Muscle strength 5/5 in all extremities. Sensation intact except for both feet on the plantar surfaces.. Gait not checked.  PSYCHIATRIC: The patient is alert and oriented to self.  SKIN: No obvious rash, lesion, or ulcer.    LABORATORY PANEL:   CBC  Recent Labs Lab 10/06/15 0959  WBC 4.9  HGB 9.4*  HCT 28.9*  PLT 93*   ------------------------------------------------------------------------------------------------------------------  Chemistries   Recent Labs Lab 10/06/15 0636  NA 138  K 4.7  CL 102  CO2 28  GLUCOSE 92  BUN 39*  CREATININE 6.25*  CALCIUM 8.5*   ------------------------------------------------------------------------------------------------------------------  Cardiac Enzymes No results for input(s): TROPONINI in the last 168 hours. ------------------------------------------------------------------------------------------------------------------  RADIOLOGY:  No results found.  EKG:   Orders placed or performed during the hospital encounter of 09/19/15  . ED EKG  . ED EKG  . EKG 12-Lead  . EKG 12-Lead  . EKG 12-Lead  . EKG 12-Lead    ASSESSMENT AND PLAN:   55 year old Caucasian gentleman history of essential hypertension is presenting with elevated blood pressure.  1. R arm thrombophlebitis: He has MSSA. On IV Ancef - finished on 10/05/15.  2. Hypertensive urgency:Controlled with Coreg, clonidine and hydralazine  3. End-stage renal disease now on hemodialysis; status permacath placement.  -Started on hemodialysis on 09/23/2015.  -Outpatient dialysis spot found, will be starting dialysis as outpatient on this coming Tuesday from 10/10/2015  4. Hyperkalemia: Received Kayexalate potassium, improved after dialysis  5.abdominal pain likely due to constipation: Continue  PPIs, stool softeners.  6.depression: Started on Remeron.  appreciate psych following.   7. Venous thromboembolism prophylactic: Heparin subcutaneous.   Physical therapy is pending at this time.    All the records are reviewed and case discussed with Care Management/Social Workerr. Management plans discussed with the patient, family and they are in agreement.  CODE STATUS: Full Code  TOTAL TIME TAKING CARE OF THIS PATIENT: 38 minutes.   POSSIBLE D/C tomorrow, DEPENDING ON CLINICAL CONDITION.   Kaydie Petsch M.D on 10/06/2015 at 1:32 PM  Between 7am to 6pm - Pager - [phone]  After 6pm go to [email or web address] - password [email or web address] [facility]  [facility]  Office  [phone]  CC: Primary care physician; No primary care provider on file.

## 2015-10-06 NOTE — Plan of Care (Signed)
Problem: Bowel/Gastric: Goal: Will not experience complications related to bowel motility Outcome: Not Progressing Pt has not had a BM despite given Miralax, prune juice, and senna

## 2015-10-06 NOTE — Clinical Social Work Note (Signed)
Clinical Social Work Assessment  Patient Details  Name: Ronnie Keith MRN: [id number] Date of Birth: 1960-10-28  Date of referral:  10/06/15               Reason for consult:   (possible placement for STR)                Permission sought to share information with:    Permission granted to share information::     Name::        Agency::     Relationship::     Contact Information:     Housing/Transportation Living arrangements for the past 2 months:  Single Family Home Source of Information:  Patient, Medical Team, Psychiatric Consultation, Other (Comment Required) (mother) Patient Interpreter Needed:  None Criminal Activity/Legal Involvement Pertinent to Current Situation/Hospitalization:    Significant Relationships:  Siblings, Parents Lives with:  Parents, Siblings Do you feel safe going back to the place where you live?  Yes Need for family participation in patient care:  No (Coment)  Care giving concerns:  None  At this time   Facilities manager / plan:  Holiday representative (CSW) consult for SNF placement, PT is recommending STR to assist with getting patient back to previous level of functioning.   Patient was alert, oriented and engaged in conversation with CSW.  (Additional Information provided by patient's mother and sister at bedside).  CSW introduced self and explained role of CSW department.  Patient currently lives with his mother and sister .  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    CSW explained that PT is recommending rehab.  Patient has never been to SNF. CSW reviewed SNF process with patient, explained that he would have to commit to 30-days as due to having no insurance would have to go under a LOG.  Patient does not want to go to SNF for 30 days and does not want to go to a facility out of [place] as his mother would not be able to drive there. Patient is agreeable to discharge home with  supportive equipment.   Patient has been set up to start dialysis Tuesday 10-10-15. His mother has assisted him with selecting  [facility].   Please notify [facility] Liaison [phone] when patient is ready to discharge so that she can update clinic and get exact time of first treatment.   RN care manager working to assist patient with all discharge needs.  Please consult CSW if additional services are needed.    Employment status:  Unemployed Forensic scientist:  Other (Comment Required) (none) PT Recommendations:  [place] / Referral to community resources:   (none at this time.  Patient does not want SNF)  Patient/Family's Response to care:  Patient and family appreciative of talking with CSW.   Patient/Family's Understanding of and Emotional Response to Diagnosis, Current Treatment, and Prognosis:  Patient understands that he is under continued medical work up at this time.  Once medically stable patient will discharge home.  Emotional Assessment Appearance:  Appears stated age Attitude/Demeanor/Rapport:    Affect (typically observed):  Accepting, Adaptable, Appropriate, Calm, Pleasant Orientation:  Oriented to Self, Oriented to Place, Oriented to  Time, Oriented to Situation Alcohol / Substance use:    Psych involvement (Current and /or in the community):  Yes (Comment) (evaluted by pych MD)  Discharge Needs  Concerns to be addressed:  Care Coordination,  Discharge Planning Concerns, Lack of Support Readmission within the last 30 days:  No Current discharge risk:  Chronically ill, Dependent with Mobility Barriers to Discharge:  Continued Medical Work up   Tery Sanfilippo 10/06/2015, 4:37 PM

## 2015-10-07 MED ORDER — HYDRALAZINE HCL 25 MG PO TABS: 25.0000 mg | ORAL_TABLET | Freq: Three times a day (TID) | ORAL | Status: DC

## 2015-10-07 MED ORDER — CLONIDINE HCL 0.1 MG PO TABS: 0.1000 mg | ORAL_TABLET | Freq: Two times a day (BID) | ORAL | Status: DC

## 2015-10-07 MED ORDER — GABAPENTIN 300 MG PO CAPS: 300.0000 mg | ORAL_CAPSULE | Freq: Two times a day (BID) | ORAL | Status: DC

## 2015-10-07 MED ORDER — OXYCODONE HCL 5 MG PO TABS: 5.0000 mg | ORAL_TABLET | Freq: Four times a day (QID) | ORAL | Status: DC | PRN

## 2015-10-07 MED ORDER — CARVEDILOL 12.5 MG PO TABS: 12.5000 mg | ORAL_TABLET | Freq: Two times a day (BID) | ORAL | Status: DC

## 2015-10-07 MED ORDER — ALPRAZOLAM 1 MG PO TABS: 1.0000 mg | ORAL_TABLET | Freq: Two times a day (BID) | ORAL | Status: DC | PRN

## 2015-10-07 MED ORDER — MIRTAZAPINE 30 MG PO TABS: 30.0000 mg | ORAL_TABLET | Freq: Every day | ORAL | Status: DC

## 2015-10-07 MED ORDER — SENNOSIDES-DOCUSATE SODIUM 8.6-50 MG PO TABS: 2.0000 | ORAL_TABLET | Freq: Two times a day (BID) | ORAL | Status: AC

## 2015-10-07 NOTE — Discharge Summary (Signed)
[facility] [place] NAME: Ronnie Keith    MR#:  [id number]  DATE OF BIRTH:  [birth year]  DATE OF ADMISSION:  09/19/2015 ADMITTING PHYSICIAN: Lytle Butte, MD  DATE OF DISCHARGE: 10/07/2015  PRIMARY CARE PHYSICIAN: No primary care provider on file.    ADMISSION DIAGNOSIS:  Elevated troponin [R79.89] Secondary hypertension, unspecified [I15.9]  DISCHARGE DIAGNOSIS:  Principal Problem:   Depression, major, recurrent, moderate (HCC) Active Problems:   Hypertensive urgency   Acute kidney injury ([place])   Hyperkalemia   Adjustment disorder with mixed anxiety and depressed mood   SECONDARY DIAGNOSIS:   Past Medical History  Diagnosis Date  . Hypertension   . Chronic low back pain   . Scoliosis   . Anxiety   . Sciatica     HOSPITAL COURSE:   55 year old Caucasian gentleman history of essential hypertension is presenting with elevated blood pressure. Noted to be hyperkalemic in acute on chronic renal failure and initiated on hemodialysis in the hospital  1. R arm thrombophlebitis: He has MSSA. On IV Ancef - finished on 10/05/15.  2. Hypertensive urgency:Controlled with Coreg, clonidine and hydralazine  3. End-stage renal disease now on hemodialysis; status permacath placement.  -Started on hemodialysis on 09/23/2015.  -Outpatient dialysis spot found, will be starting dialysis as outpatient on this coming Tuesday from 10/10/2015 Last dialysis in the hospital was on 10/06/2015 -Appreciate nephrology consult  4. Hyperkalemia: Received Kayexalate potassium, improved after dialysis  5.abdominal pain likely due to constipation: Continue PPIs, stool softeners.  6.depression: Started on Remeron. appreciate psych following.   7. Peripheral neuropathy-started on gabapentin.  8. Left great toe pain-x-ray showing chronic fracture likely. Weight bearing as tolerated and outpatient podiatry follow-up recommended. Also on pain  medications.   Physical therapy recommended SNF. However patient refused and is going home. Through charity home health services can be arranged.   DISCHARGE CONDITIONS:   Guarded  CONSULTS OBTAINED:  Treatment Team:  Lytle Butte, MD Anthonette Legato, MD Nickie Retort, MD Katha Cabal, MD Gonzella Lex, MD  DRUG ALLERGIES:  No Known Allergies  DISCHARGE MEDICATIONS:   Current Discharge Medication List    START taking these medications   Details  ALPRAZolam (XANAX) 1 MG tablet Take 1 tablet (1 mg total) by mouth 2 (two) times daily as needed for anxiety. Qty: 20 tablet, Refills: 0    cloNIDine (CATAPRES) 0.1 MG tablet Take 1 tablet (0.1 mg total) by mouth 2 (two) times daily. Qty: 60 tablet, Refills: 2    gabapentin (NEURONTIN) 300 MG capsule Take 1 capsule (300 mg total) by mouth 2 (two) times daily. Qty: 60 capsule, Refills: 2    hydrALAZINE (APRESOLINE) 25 MG tablet Take 1 tablet (25 mg total) by mouth every 8 (eight) hours. Qty: 90 tablet, Refills: 2    mirtazapine (REMERON) 30 MG tablet Take 1 tablet (30 mg total) by mouth at bedtime. Qty: 30 tablet, Refills: 2    oxyCODONE (OXY IR/ROXICODONE) 5 MG immediate release tablet Take 1 tablet (5 mg total) by mouth every 6 (six) hours as needed for moderate pain. Qty: 20 tablet, Refills: 0    senna-docusate (SENOKOT-S) 8.6-50 MG tablet Take 2 tablets by mouth 2 (two) times daily. Qty: 60 tablet, Refills: 2      CONTINUE these medications which have CHANGED   Details  carvedilol (COREG) 12.5 MG tablet Take 1 tablet (12.5 mg total) by mouth 2 (two) times daily with a meal.  Qty: 60 tablet, Refills: 2      CONTINUE these medications which have NOT CHANGED   Details  acetaminophen (TYLENOL) 325 MG tablet Take 650 mg by mouth every 6 (six) hours as needed for mild pain or moderate pain.      STOP taking these medications     ibuprofen (ADVIL,MOTRIN) 200 MG tablet      methocarbamol (ROBAXIN) 500 MG  tablet          DISCHARGE INSTRUCTIONS:   1. PCP f/u in 1 week 2. For Hemodialysis on 10/10/15  If you experience worsening of your admission symptoms, develop shortness of breath, life threatening emergency, suicidal or homicidal thoughts you must seek medical attention immediately by calling 911 or calling your MD immediately  if symptoms less severe.  You Must read complete instructions/literature along with all the possible adverse reactions/side effects for all the Medicines you take and that have been prescribed to you. Take any new Medicines after you have completely understood and accept all the possible adverse reactions/side effects.   Please note  You were cared for by a hospitalist during your hospital stay. If you have any questions about your discharge medications or the care you received while you were in the hospital after you are discharged, you can call the unit and asked to speak with the hospitalist on call if the hospitalist that took care of you is not available. Once you are discharged, your primary care physician will handle any further medical issues. Please note that NO REFILLS for any discharge medications will be authorized once you are discharged, as it is imperative that you return to your primary care physician (or establish a relationship with a primary care physician if you do not have one) for your aftercare needs so that they can reassess your need for medications and monitor your lab values.    Today   CHIEF COMPLAINT:   Chief Complaint  Patient presents with  . Hypertension    VITAL SIGNS:  Blood pressure 137/79, pulse 85, temperature 97.3 F (36.3 C), temperature source Oral, resp. rate 12, height 6' (1.829 m), weight 108.8 kg (239 lb 13.8 oz), SpO2 96 %.  I/O:   Intake/Output Summary (Last 24 hours) at 10/07/15 0956 Last data filed at 10/07/15 0100  Gross per 24 hour  Intake    200 ml  Output    150 ml  Net     50 ml    PHYSICAL  EXAMINATION:   Physical Exam  GENERAL: 55 y.o.-year-old obese patient lying in the bed with no acute distress.  EYES: Pupils equal, round, reactive to light and accommodation. No scleral icterus. Extraocular muscles intact.  HEENT: Head atraumatic, normocephalic. Oropharynx and nasopharynx clear.  NECK: Supple, no jugular venous distention. No thyroid enlargement, no tenderness.  LUNGS: Normal breath sounds bilaterally, no wheezing, rales,rhonchi or crepitation. No use of accessory muscles of respiration.  CARDIOVASCULAR: S1, S2 normal. No murmurs, rubs, or gallops.  ABDOMEN: Soft, nontender, nondistended. Bowel sounds present. No organomegaly or mass.  EXTREMITIES: No pedal edema, cyanosis, or clubbing. tenderness of left toe bunion on medial side NEUROLOGIC: Cranial nerves II through XII are intact. Muscle strength 5/5 in all extremities. Sensation intact except for both feet on the plantar surfaces.. Gait not checked.  PSYCHIATRIC: The patient is alert and oriented to self.  SKIN: No obvious rash, lesion, or ulcer.   DATA REVIEW:   CBC  Recent Labs Lab 10/06/15 0959  WBC 4.9  HGB 9.4*  HCT 28.9*  PLT 93*    Chemistries   Recent Labs Lab 10/06/15 0636  NA 138  K 4.7  CL 102  CO2 28  GLUCOSE 92  BUN 39*  CREATININE 6.25*  CALCIUM 8.5*    Cardiac Enzymes No results for input(s): TROPONINI in the last 168 hours.  Microbiology Results  Results for orders placed or performed during the hospital encounter of 09/19/15  CULTURE, BLOOD (ROUTINE X 2) w Reflex to PCR ID Panel     Status: None   Collection Time: 09/25/15 11:08 AM  Result Value Ref Range Status   Specimen Description BLOOD LEFT HAND  Final   Special Requests BOTTLES DRAWN AEROBIC AND ANAEROBIC [place]  Final   Culture NO GROWTH 5 DAYS  Final   Report Status 09/30/2015 FINAL  Final  CULTURE, BLOOD (ROUTINE X 2) w Reflex to PCR ID Panel     Status: None   Collection Time: 09/25/15  3:41  PM  Result Value Ref Range Status   Specimen Description BLOOD LINE  Final   Special Requests   Final    BOTTLES DRAWN AEROBIC AND ANAEROBIC AERO 8CC ANA 14CC   Culture NO GROWTH 5 DAYS  Final   Report Status 09/30/2015 FINAL  Final  Wound culture     Status: None   Collection Time: 09/26/15  3:02 PM  Result Value Ref Range Status   Specimen Description WOUND  Final   Special Requests Normal  Final   Gram Stain RARE WBC SEEN NO ORGANISMS SEEN   Final   Culture LIGHT GROWTH STAPHYLOCOCCUS AUREUS  Final   Report Status 09/29/2015 FINAL  Final   Organism ID, Bacteria STAPHYLOCOCCUS AUREUS  Final      Susceptibility   Staphylococcus aureus - MIC*    OXACILLIN Value in next row Sensitive      SENSITIVE<=0.25    GENTAMICIN Value in next row Sensitive      SENSITIVE<=0.5    CIPROFLOXACIN Value in next row Sensitive      SENSITIVE<=0.5    ERYTHROMYCIN Value in next row Sensitive      SENSITIVE<=0.25    CLINDAMYCIN Value in next row Sensitive      SENSITIVE<=0.25    VANCOMYCIN Value in next row Sensitive      SENSITIVE1    TETRACYCLINE Value in next row Sensitive      SENSITIVE<=1    RIFAMPIN Value in next row Sensitive      SENSITIVE<=0.5    TRIMETH/SULFA Value in next row Sensitive      SENSITIVE<=10    * LIGHT GROWTH STAPHYLOCOCCUS AUREUS    RADIOLOGY:  Dg Foot Complete Left  10/06/2015  CLINICAL DATA:  55 year old male with acute left great toe pain and swelling. EXAM: LEFT FOOT - COMPLETE 3+ VIEW COMPARISON:  None. FINDINGS: Irregularity at the medial base of the great toe proximal phalanx appears remote but correlate with pain. No other fracture, subluxation or dislocation identified. Degenerative changes at the first MTP joint noted. The Lisfranc joints are unremarkable. IMPRESSION: Irregularity at the medial base of the great toe proximal phalanx -appears remote but correlate with pain as acute fractures not entirely excluded. Electronically Signed   By: Margarette Canada M.D.    On: 10/06/2015 19:11    EKG:   Orders placed or performed during the hospital encounter of 09/19/15  . ED EKG  . ED EKG  . EKG 12-Lead  . EKG 12-Lead  . EKG 12-Lead  . EKG 12-Lead  Management plans discussed with the patient, family and they are in agreement.  CODE STATUS:     Code Status Orders        Start     Ordered   09/20/15 0112  Full code   Continuous     09/20/15 0112    Code Status History    Date Active Date Inactive Code Status Order ID Comments User Context   This patient has a current code status but no historical code status.    Advance Directive Documentation        Most Recent Value   Type of Advance Directive  Healthcare Power of Attorney   Pre-existing out of facility DNR order (yellow form or pink MOST form)     "MOST" Form in Place?        TOTAL TIME TAKING CARE OF THIS PATIENT: 38 minutes.    Hommer Cunliffe M.D on 10/07/2015 at 9:56 AM  Between 7am to 6pm - Pager - [phone]  After 6pm go to [email or web address] - password [email or web address] [facility]  [facility]  Office  [phone]  CC: Primary care physician; No primary care provider on file.

## 2015-10-07 NOTE — Progress Notes (Signed)
[facility]  ROUNDING NOTE   Subjective:   Doing much better this morning In good spirits Sitting up in the chair No nausea vomiting No shortness of breath     Objective:  Vital signs in last 24 hours:  Temp:  [97.3 F (36.3 C)-99.4 F (37.4 C)] 97.3 F (36.3 C) (02/04 0450) Pulse Rate:  [85-89] 85 (02/04 0450) Resp:  [12-16] 12 (02/04 0450) BP: (127-161)/(70-79) 137/79 mmHg (02/04 0846) SpO2:  [96 %-98 %] 96 % (02/04 0450)  Weight change:  Filed Weights   10/02/15 0511 10/04/15 0915 10/04/15 1300  Weight: 114.488 kg (252 lb 6.4 oz) 111.5 kg (245 lb 13 oz) 108.8 kg (239 lb 13.8 oz)    Intake/Output: I/O last 3 completed shifts: In: 10 [P.O.:440; IV Piggyback:45] Out: 675 [Urine:675]   Intake/Output this shift:  Total I/O In: 240 [P.O.:240] Out: -   Physical Exam: General: NAD, sitting up in chair   Head: Normocephalic, atraumatic. Moist oral mucosal membranes  Eyes: Anicteric,    Neck: Supple, trachea midline  Lungs:  Clear to auscultation normal effort  Heart: Regular rate and rhythm  Abdomen:  Soft, nontender,    Extremities: No  peripheral edema.  Neurologic: Nonfocal, moving all four extremities  Skin: No lesions  Access: RIJ permcath 09/29/15 Dr. Delana Meyer    Basic Metabolic Panel:  Recent Labs Lab 10/02/15 0937 10/06/15 0636  NA 141 138  K 4.0 4.7  CL 104 102  CO2 33* 28  GLUCOSE 109* 92  BUN 41* 39*  CREATININE 5.45* 6.25*  CALCIUM 8.4* 8.5*  PHOS 3.5 4.8*    Liver Function Tests:  Recent Labs Lab 10/02/15 0937  ALBUMIN 2.7*   No results for input(s): LIPASE, AMYLASE in the last 168 hours. No results for input(s): AMMONIA in the last 168 hours.  CBC:  Recent Labs Lab 10/02/15 0937 10/05/15 0450 10/06/15 0959  WBC 4.9 6.2 4.9  HGB 9.4* 10.1* 9.4*  HCT 28.5* 30.2* 28.9*  MCV 90.0 88.4 88.4  PLT 127* 106* 93*    Cardiac Enzymes: No results for input(s): CKTOTAL, CKMB, CKMBINDEX, TROPONINI in the last 168  hours.  BNP: Invalid input(s): POCBNP  CBG: No results for input(s): GLUCAP in the last 168 hours.  Microbiology: Results for orders placed or performed during the hospital encounter of 09/19/15  CULTURE, BLOOD (ROUTINE X 2) w Reflex to PCR ID Panel     Status: None   Collection Time: 09/25/15 11:08 AM  Result Value Ref Range Status   Specimen Description BLOOD LEFT HAND  Final   Special Requests BOTTLES DRAWN AEROBIC AND ANAEROBIC [place]  Final   Culture NO GROWTH 5 DAYS  Final   Report Status 09/30/2015 FINAL  Final  CULTURE, BLOOD (ROUTINE X 2) w Reflex to PCR ID Panel     Status: None   Collection Time: 09/25/15  3:41 PM  Result Value Ref Range Status   Specimen Description BLOOD LINE  Final   Special Requests   Final    BOTTLES DRAWN AEROBIC AND ANAEROBIC AERO 8CC ANA 14CC   Culture NO GROWTH 5 DAYS  Final   Report Status 09/30/2015 FINAL  Final  Wound culture     Status: None   Collection Time: 09/26/15  3:02 PM  Result Value Ref Range Status   Specimen Description WOUND  Final   Special Requests Normal  Final   Gram Stain RARE WBC SEEN NO ORGANISMS SEEN   Final   Culture LIGHT GROWTH  STAPHYLOCOCCUS AUREUS  Final   Report Status 09/29/2015 FINAL  Final   Organism ID, Bacteria STAPHYLOCOCCUS AUREUS  Final      Susceptibility   Staphylococcus aureus - MIC*    OXACILLIN Value in next row Sensitive      SENSITIVE<=0.25    GENTAMICIN Value in next row Sensitive      SENSITIVE<=0.5    CIPROFLOXACIN Value in next row Sensitive      SENSITIVE<=0.5    ERYTHROMYCIN Value in next row Sensitive      SENSITIVE<=0.25    CLINDAMYCIN Value in next row Sensitive      SENSITIVE<=0.25    VANCOMYCIN Value in next row Sensitive      SENSITIVE1    TETRACYCLINE Value in next row Sensitive      SENSITIVE<=1    RIFAMPIN Value in next row Sensitive      SENSITIVE<=0.5    TRIMETH/SULFA Value in next row Sensitive      SENSITIVE<=10    * LIGHT GROWTH STAPHYLOCOCCUS AUREUS     Coagulation Studies: No results for input(s): LABPROT, INR in the last 72 hours.  Urinalysis: No results for input(s): COLORURINE, LABSPEC, PHURINE, GLUCOSEU, HGBUR, BILIRUBINUR, KETONESUR, PROTEINUR, UROBILINOGEN, NITRITE, LEUKOCYTESUR in the last 72 hours.  Invalid input(s): APPERANCEUR    Imaging: Dg Foot Complete Left  10/06/2015  CLINICAL DATA:  55 year old male with acute left great toe pain and swelling. EXAM: LEFT FOOT - COMPLETE 3+ VIEW COMPARISON:  None. FINDINGS: Irregularity at the medial base of the great toe proximal phalanx appears remote but correlate with pain. No other fracture, subluxation or dislocation identified. Degenerative changes at the first MTP joint noted. The Lisfranc joints are unremarkable. IMPRESSION: Irregularity at the medial base of the great toe proximal phalanx -appears remote but correlate with pain as acute fractures not entirely excluded. Electronically Signed   By: Margarette Canada M.D.   On: 10/06/2015 19:11     Medications:     . aspirin EC  81 mg Oral Daily  . carvedilol  12.5 mg Oral BID WC  . cloNIDine  0.1 mg Oral BID  . epoetin (EPOGEN/PROCRIT) injection  10,000 Units Intravenous Q M,W,F-HD  . feeding supplement (NEPRO CARB STEADY)  237 mL Oral TID WC  . hydrALAZINE  25 mg Oral 3 times per day  . mirtazapine  30 mg Oral QHS  . senna-docusate  1 tablet Oral BID   acetaminophen **OR** acetaminophen, ALPRAZolam, alteplase, hydrALAZINE, lactulose, lidocaine (PF), lidocaine-prilocaine, morphine injection, nitroGLYCERIN, ondansetron **OR** ondansetron (ZOFRAN) IV, oxyCODONE, pentafluoroprop-tetrafluoroeth, sodium chloride  Assessment/ Plan:  55 y.o.white male  with hypertension, chronic low back pain, scoliosis, anxiety, Down's Baseline Cr 3.4 who was admitted to [facility] on 09/19/2015 for evaluation of uncontrolled hypertension.   1. End Stage Renal Disease: completed Four treatments of hemodialysis.  Baseline creatinine 3.4 on 12/28/14.   Etiology of ESRD unclear but most likely secondary to uncontrolled HTN and  focal glomeruloscerlosis with significant proteinuria. Serologic work up negative.   -Patient had first hemodialysis treatment 09/23/15. [facility] T[facility]  2. Anemia chronic kidney disease. Hgb 9.4 - epo with dialysis treatment.   3. Secondary hyperparathyroidism. PTH 411, phos 4.8 - not currently on a binder or vitamin D agent.   4. Depression - in good spirits today - expecting d/c home     LOS: [place] 2/4/201711:59 AM

## 2015-10-07 NOTE — Care Management Note (Addendum)
Case Management Note  Patient Details  Name: Ronnie Keith MRN: [id number] Date of Birth: [birth year]  Subjective/Objective:      Ronnie Keith at [place] who reports that she is seeking Environmental education officer for home health services. CM will contact the [facility] if [facility] arrange [employer]. All discharge information is faxed to [facility]. [name] Mother was given information about his dialysis appointment at [facility] on 10/10/15 at [place].  [name] Mother was given discount coupons to take to [facility] today for his prescriptions. [name] was directed to DSS to apply for Medicaid. [name] reports that he has an appointment at [facility] but he does not know the date or time. His Mother was encouraged to call [facility] Monday to find out when [name] appointment is. All information was provided in writing and was reviewed with both [name] and his Mother. His Mother appeared to be somewhat distracted and was redirected frequently back to the written follow-up instructions.              Action/Plan:   Expected Discharge Date:                  Expected Discharge Plan:     In-House Referral:     Discharge planning Services     Post Acute Care Choice:    Choice offered to:     DME Arranged:    DME Agency:     HH Arranged:    [place] Agency:     Status of Service:     Medicare Important Message Given:    Date Medicare IM Given:    Medicare IM give by:    Date Additional Medicare IM Given:    Additional Medicare Important Message give by:     If discussed at [place] of Stay Meetings, dates discussed:    Additional Comments:  Javontay Vandam A, RN 10/07/2015, 10:51 AM

## 2015-10-10 ENCOUNTER — Encounter: Payer: Self-pay | Admitting: Vascular Surgery

## 2015-10-10 NOTE — Op Note (Signed)
Operative Report     PROCEDURE: 1. Insertion right IJ tunneled dialysis catheter placement 2. Catheter placement and cannulation under ultrasound and fluoroscopic guidance  PRE-OPERATIVE DIAGNOSIS: end-stage renal requiring hemodialysis  POST-OPERATIVE DIAGNOSIS: same as above  SURGEON: Katha Cabal, M.D.  ANESTHESIA: Conscious sedation was administered under my direct supervision. IV Versed plus fentanyl were utilized. Continuous ECG, pulse oximetry and blood pressure was monitored throughout the entire procedure.  Conscious sedation was for a total of 45 minutes.  ESTIMATED BLOOD LOSS: Minimal  FINDING(S): 1.  Tips of the catheter in the right atrium on fluoroscopy 2.  No obvious pneumothorax on fluoroscopy  SPECIMEN(S):  none  INDICATIONS:   Ronnie Keith is a 55 y.o. male  presents with end stage renal disease.  Therefore, the patient requires a tunneled dialysis catheter placement.  The patient is informed of  the risks catheter placement include but are not limited to: bleeding, infection, central venous injury, pneumothorax, possible venous stenosis, possible malpositioning in the venous system, and possible infections related to long-term catheter presence.  The patient was aware of these risks and agreed to proceed.  DESCRIPTION: The patient was taken back to Special Procedure suite.  Prior to sedation, the patient was given IV antibiotics.  After obtaining adequate sedation, the patient was prepped and draped in the standard fashion for a chest or neck tunneled dialysis catheter placement.  Appropriate Time Out is called.   The the neck and chest wall are then infiltrated with 1% Lidocaine with epinepherine.  A 19 cm tip to cuff palindrome catheter is then selected, opened on the back table and prepped. Under ultrasound guidance, the right internal jugular vein was cannulated with the Seldinger needle.  A J-wire was then advanced under fluoroscopic guidance into the  inferior vena cava and the wire was secured.  Small counter incision was then made at the wire insertion site. A small pocket was fashioned with blunt dissection to allow easier passage of the cuff.  The dilator and peel-away sheath are then advanced over the wire under fluoroscopic guidance. The catheters and advanced through the peel-away sheath after removal of the wire. It is approximated to the chest wall after verifying the tips at the atrial caval junction and an exit site is selected.  Small incision is made at the selected exit site and the tunneling device was passed subcutaneously to the neck counter incision. Catheter is then pulled through the subcutaneous tunnel. The catheter is then verified for tip position under fluoroscopy, transected and the hub assembly connected.    Each port was tested by aspirating and flushing.  No resistance was noted.  Each port was then thoroughly flushed with heparinized saline.  The catheter was secured in placed with two interrupted stitches of 0 silk tied to the catheter.  The neck incision was closed with a U-stitch of 4-0 Monocryl.  The neck and chest incision were cleaned and sterile bandages applied including a Biopatch.  Each port was then packed with concentrated heparin (1000 Units/mL) at the manufacturer recommended volumes to each port.  Sterile caps were applied to each port.  On completion fluoroscopy, the tips of the catheter were in the right atrium, and there was no evidence of pneumothorax.  COMPLICATIONS: None  CONDITION: Unchanged   Katha Cabal M.D. [facility] Office: [phone]   10/10/2015, 9:49 AM

## 2016-01-04 ENCOUNTER — Encounter: Payer: Self-pay | Admitting: Emergency Medicine

## 2016-01-04 ENCOUNTER — Emergency Department
Admission: EM | Admit: 2016-01-04 | Discharge: 2016-01-04 | Disposition: A | Discharge status: Home / Self Care | Payer: Medicare Other | Attending: Emergency Medicine | Admitting: Emergency Medicine

## 2016-01-04 DIAGNOSIS — R000 Tachycardia, unspecified: Secondary | ICD-10-CM | POA: Diagnosis present

## 2016-01-04 DIAGNOSIS — R002 Palpitations: Secondary | ICD-10-CM | POA: Diagnosis not present

## 2016-01-04 DIAGNOSIS — I10 Essential (primary) hypertension: Secondary | ICD-10-CM | POA: Insufficient documentation

## 2016-01-04 NOTE — ED Provider Notes (Signed)
[facility] Emergency Department Provider Note  ____________________________________________    I have reviewed the triage vital signs and the nursing notes.   HISTORY  Chief Complaint Tachycardia    HPI Ronnie Keith is a 55 y.o. male who presents from dialysis via EMS for heart palpitations. 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 He is quite irritated at being sent to the emergency department. He also wishes that the ambulance and taken to [facility] instead. He reports he is feeling better now. He denies chest pain during the palpitations or shortness of breath. He is also irritated that he continues to have palpitations after the ablation.      Past Medical History  Diagnosis Date  . Hypertension   . Chronic low back pain   . Scoliosis   . Anxiety   . Sciatica     Patient Active Problem List   Diagnosis Date Noted  . Depression, major, recurrent, moderate ([place]) 09/28/2015  . Adjustment disorder with mixed anxiety and depressed mood 09/28/2015  . Hypertensive urgency 09/20/2015  . Acute kidney injury ([place]) 09/20/2015  . Hyperkalemia 09/20/2015    Past Surgical History  Procedure Laterality Date  . Peripheral vascular catheterization N/A 09/29/2015    Procedure: Dialysis/Perma Catheter Insertion;  Surgeon: Katha Cabal, MD;  Location: [facility];  Service: Cardiovascular;  Laterality: N/A;    Current Outpatient Rx  Name  Route  Sig  Dispense  Refill  . acetaminophen (TYLENOL) 325 MG tablet   Oral   Take 650 mg by mouth every 6 (six) hours as needed for mild pain or moderate pain.         Marland Kitchen ALPRAZolam (XANAX) 1 MG tablet   Oral   Take 1 tablet (1 mg total) by mouth 2 (two) times daily as needed for anxiety.   20 tablet   0   . carvedilol (COREG) 12.5 MG tablet   Oral   Take 1 tablet (12.5 mg total) by mouth 2 (two) times daily with a meal.   60 tablet   2   . cloNIDine (CATAPRES) 0.1 MG  tablet   Oral   Take 1 tablet (0.1 mg total) by mouth 2 (two) times daily.   60 tablet   2   . gabapentin (NEURONTIN) 300 MG capsule   Oral   Take 1 capsule (300 mg total) by mouth 2 (two) times daily.   60 capsule   2   . hydrALAZINE (APRESOLINE) 25 MG tablet   Oral   Take 1 tablet (25 mg total) by mouth every 8 (eight) hours.   90 tablet   2   . mirtazapine (REMERON) 30 MG tablet   Oral   Take 1 tablet (30 mg total) by mouth at bedtime.   30 tablet   2   . oxyCODONE (OXY IR/ROXICODONE) 5 MG immediate release tablet   Oral   Take 1 tablet (5 mg total) by mouth every 6 (six) hours as needed for moderate pain.   20 tablet   0   . senna-docusate (SENOKOT-S) 8.6-50 MG tablet   Oral   Take 2 tablets by mouth 2 (two) times daily.   60 tablet   2     Allergies Review of patient's allergies indicates no known allergies.  Family History  Problem Relation Age of Onset  . Hypertension Other     Social History Social History  Substance Use Topics  .  Smoking status: Never Smoker   . Smokeless tobacco: Never Used  . Alcohol Use: No    Review of Systems  Constitutional: Negative for fever.No dizziness Eyes: Negative for blurry vision  Cardiovascular: Negative for chest pain. Positive for palpitations  Respiratory: Negative for shortness of breath. Negative for cough Gastrointestinal: Negative for abdominal pain. Negative for nausea  Musculoskeletal: Negative for back pain. Skin: Negative for rash. Neurological: Negative for focal weakness. No headache Psychiatric: Mild anxiety    ____________________________________________   PHYSICAL EXAM:  VITAL SIGNS: ED Triage Vitals  Enc Vitals Group     BP 01/04/16 0904 122/93 mmHg     Pulse --      Resp 01/04/16 0904 12     Temp 01/04/16 0904 97.9 F (36.6 C)     Temp Source 01/04/16 0904 Oral     SpO2 01/04/16 0904 100 %     Weight 01/04/16 0904 240 lb 4.8 oz (109 kg)     Height 01/04/16 0904 6' (1.829  m)     Head Cir --      Peak Flow --      Pain Score --      Pain Loc --      Pain Edu? --      Excl. in [place]? --      Constitutional: Alert and oriented. Well appearing and in no distress.  Eyes: Conjunctivae are normal. No erythema or injection ENT   Head: Normocephalic and atraumatic.   Mouth/Throat: Mucous membranes are moist. Cardiovascular: Mild tachycardia, regular rhythm. Normal and symmetric distal pulses are present in the upper extremities and lower extremities. No murmurs or rubs  Respiratory: Normal respiratory effort without tachypnea nor retractions. Breath sounds are clear and equal bilaterally.  Gastrointestinal: Soft and non-tender in all quadrants. No distention. There is no CVA tenderness. Genitourinary: deferred Musculoskeletal: Nontender with normal range of motion in all extremities. No lower extremity tenderness nor edema. Neurologic:  Normal speech and language. No gross focal neurologic deficits are appreciated. Skin:  Skin is warm, dry and intact. No rash noted. Psychiatric: Mood and affect are normal. Patient exhibits appropriate insight and judgment.  ____________________________________________    LABS (pertinent positives/negatives)  Labs Reviewed - No data to display  ____________________________________________   EKG  ED ECG REPORT I, Lavonia Drafts, the attending physician, personally viewed and interpreted this ECG.   Date: 01/04/2016  EKG Time: 9:04 AM  Rate: 105  Rhythm: sinus tachycardia  Axis: Normal axis  Intervals:Findings consistent with WPW  ST&T Change: Nonspecific   ____________________________________________    RADIOLOGY  None  ____________________________________________   PROCEDURES  Procedure(s) performed: none  Critical Care performed: none  ____________________________________________   INITIAL IMPRESSION / ASSESSMENT AND PLAN / ED COURSE  Pertinent labs & imaging results that were available  during my care of the patient were reviewed by me and considered in my medical decision making (see chart for details).  Patient is refusing lab work, x-ray, IV. He wants to leave immediately. I explained to him that we are unclear what caused his palpitations at this point that he needs further evaluation but he adamantly refuses. I told him we could easily obtain labs here and discussed with his cardiologist at [facility] just like they would do at [facility] but he has no interest in that. He wants to leave immediately. He understands this is AGAINST MEDICAL ADVICE and that he could suffer significant deficits or even death from doing so. He has decisional capacity.  -----------------------------------------  9:59 AM on 01/04/2016 -----------------------------------------  Patient's mother and aunt have now arrived and they would prefer to take him to [facility] without further workup here. I reiterated the above message and they understand and are comfortable with this decision  ____________________________________________   FINAL CLINICAL IMPRESSION(S) / ED DIAGNOSES  Palpitations       L[place]onia Drafts, MD 01/04/16 [phone]

## 2016-01-04 NOTE — ED Notes (Signed)
Pt arrived via EMS from dialysis for reports of tachycardia. Dialysis staff reported 160 HR. Dr. Zollie Scale wanted pt transported to ED for evaluation. Pt has history of WPW. Pt states had cardiac ablations approximately one month ago. EMS reports 140/70, 107 HR.

## 2016-01-04 NOTE — Discharge Instructions (Signed)

## 2016-05-21 ENCOUNTER — Encounter: Payer: Self-pay | Admitting: *Deleted

## 2016-05-21 ENCOUNTER — Emergency Department: Payer: Medicare Other

## 2016-05-21 ENCOUNTER — Emergency Department
Admission: EM | Admit: 2016-05-21 | Discharge: 2016-05-21 | Disposition: A | Discharge status: Home / Self Care | Payer: Medicare Other | Attending: Emergency Medicine | Admitting: Emergency Medicine

## 2016-05-21 DIAGNOSIS — R079 Chest pain, unspecified: Secondary | ICD-10-CM

## 2016-05-21 DIAGNOSIS — Z7982 Long term (current) use of aspirin: Secondary | ICD-10-CM | POA: Diagnosis not present

## 2016-05-21 DIAGNOSIS — I120 Hypertensive chronic kidney disease with stage 5 chronic kidney disease or end stage renal disease: Secondary | ICD-10-CM | POA: Diagnosis not present

## 2016-05-21 DIAGNOSIS — Z992 Dependence on renal dialysis: Secondary | ICD-10-CM | POA: Insufficient documentation

## 2016-05-21 DIAGNOSIS — R110 Nausea: Secondary | ICD-10-CM | POA: Insufficient documentation

## 2016-05-21 DIAGNOSIS — N186 End stage renal disease: Secondary | ICD-10-CM | POA: Insufficient documentation

## 2016-05-21 DIAGNOSIS — M549 Dorsalgia, unspecified: Secondary | ICD-10-CM | POA: Diagnosis not present

## 2016-05-21 DIAGNOSIS — Z79899 Other long term (current) drug therapy: Secondary | ICD-10-CM | POA: Diagnosis not present

## 2016-05-21 LAB — CBC
HEMATOCRIT: 33.6 % — AB (ref 40.0–52.0)
HEMOGLOBIN: 11.4 g/dL — AB (ref 13.0–18.0)
MCH: 31.4 pg (ref 26.0–34.0)
MCHC: 33.8 g/dL (ref 32.0–36.0)
MCV: 93 fL (ref 80.0–100.0)
Platelets: 126 10*3/uL — ABNORMAL LOW (ref 150–440)
RBC: 3.62 MIL/uL — AB (ref 4.40–5.90)
RDW: 15.8 % — ABNORMAL HIGH (ref 11.5–14.5)
WBC: 4.7 10*3/uL (ref 3.8–10.6)

## 2016-05-21 LAB — COMPREHENSIVE METABOLIC PANEL
ALBUMIN: 3.6 g/dL (ref 3.5–5.0)
ALK PHOS: 148 U/L — AB (ref 38–126)
ALT: 14 U/L — AB (ref 17–63)
AST: 16 U/L (ref 15–41)
Anion gap: 7 (ref 5–15)
BILIRUBIN TOTAL: 0.7 mg/dL (ref 0.3–1.2)
BUN: 19 mg/dL (ref 6–20)
CALCIUM: 8 mg/dL — AB (ref 8.9–10.3)
CO2: 32 mmol/L (ref 22–32)
Chloride: 101 mmol/L (ref 101–111)
Creatinine, Ser: 5.05 mg/dL — ABNORMAL HIGH (ref 0.61–1.24)
GFR calc Af Amer: 14 mL/min — ABNORMAL LOW (ref >60)
GFR calc non Af Amer: 12 mL/min — ABNORMAL LOW (ref >60)
Glucose, Bld: 78 mg/dL (ref 65–99)
Potassium: 3.9 mmol/L (ref 3.5–5.1)
SODIUM: 140 mmol/L (ref 135–145)
TOTAL PROTEIN: 7.3 g/dL (ref 6.5–8.1)

## 2016-05-21 LAB — TROPONIN I
Troponin I: <0.03 ng/mL (ref <0.03)
Troponin I: <0.03 ng/mL (ref <0.03)

## 2016-05-21 MED ORDER — MORPHINE SULFATE (PF) 4 MG/ML IV SOLN
4.0000 mg | Freq: Once | INTRAVENOUS | Status: AC
  Administered 2016-05-21: 4 mg via INTRAVENOUS
  Filled 2016-05-21: qty 1

## 2016-05-21 MED ORDER — ONDANSETRON HCL 4 MG/2ML IJ SOLN
4.0000 mg | Freq: Once | INTRAMUSCULAR | Status: AC
  Administered 2016-05-21: 4 mg via INTRAVENOUS
  Filled 2016-05-21: qty 2

## 2016-05-21 NOTE — ED Notes (Signed)
Iv started and meds given.   

## 2016-05-21 NOTE — ED Notes (Signed)
Report off to [facility]

## 2016-05-21 NOTE — Discharge Instructions (Signed)
You have been seen in the emergency department today for chest pain. Your workup has shown normal results. As we discussed please follow-up with your primary care physician in the next 1-2 days for recheck. Return to the emergency department for any further chest pain, trouble breathing, or any other symptom personally concerning to yourself. °

## 2016-05-21 NOTE — ED Notes (Signed)
Pt brought in via ems from dialysis.  Pt has chest pain.  Sx began 45 minutes ago.    Pt alert.  Family with pt.

## 2016-05-21 NOTE — ED Triage Notes (Signed)
Pt brought in via ems from dialysis.  Pt has chest pain for 45 minutes with nausea. Pt given 4mg  zofran im en route.  Pt alert.

## 2016-05-21 NOTE — ED Provider Notes (Signed)
[facility] Emergency Department Provider Note  Time seen: 4:59 PM  I have reviewed the triage vital signs and the nursing notes.   HISTORY  Chief Complaint Chest Pain    HPI Ronnie Keith is a 55 y.o. male with a past medical history of anxiety, hypertension, end-stage renal disease on hemodialysis who presents to the emergency department with chest pain. According to the patient he received his dialysis treatment today when he began experiencing "pain all over." EMS who brought the patient into the emergency department states they're very familiar with the patient and he typically describes chest pain after dialysis sessions. Patient states his chest is hurting but states his entire body is hurting. Denies any shortness of breath or diaphoresis. Patient does state nausea but denies any vomiting. Denies any fever. Denies recent cough or congestion. Denies abdominal pain.  Past Medical History:  Diagnosis Date  . Anxiety   . Chronic low back pain   . Hypertension   . Sciatica   . Scoliosis     Patient Active Problem List   Diagnosis Date Noted  . Depression, major, recurrent, moderate ([place]) 09/28/2015  . Adjustment disorder with mixed anxiety and depressed mood 09/28/2015  . Hypertensive urgency 09/20/2015  . Acute kidney injury ([place]) 09/20/2015  . Hyperkalemia 09/20/2015    Past Surgical History:  Procedure Laterality Date  . PERIPHERAL VASCULAR CATHETERIZATION N/A 09/29/2015   Procedure: Dialysis/Perma Catheter Insertion;  Surgeon: Katha Cabal, MD;  Location: [place] [facility];  Service: Cardiovascular;  Laterality: N/A;    Prior to Admission medications   Medication Sig Start Date End Date Taking? Authorizing Provider  acetaminophen (TYLENOL) 325 MG tablet Take 650 mg by mouth every 6 (six) hours as needed for mild pain or moderate pain.    Historical Provider, MD  ALPRAZolam Duanne Moron) 1 MG tablet Take 1 tablet (1 mg total) by mouth 2 (two)  times daily as needed for anxiety. 10/07/15   Gladstone Lighter, MD  carvedilol (COREG) 12.5 MG tablet Take 1 tablet (12.5 mg total) by mouth 2 (two) times daily with a meal. 10/07/15   Gladstone Lighter, MD  cloNIDine (CATAPRES) 0.1 MG tablet Take 1 tablet (0.1 mg total) by mouth 2 (two) times daily. 10/07/15   Gladstone Lighter, MD  gabapentin (NEURONTIN) 300 MG capsule Take 1 capsule (300 mg total) by mouth 2 (two) times daily. 10/07/15   Gladstone Lighter, MD  hydrALAZINE (APRESOLINE) 25 MG tablet Take 1 tablet (25 mg total) by mouth every 8 (eight) hours. 10/07/15   Gladstone Lighter, MD  mirtazapine (REMERON) 30 MG tablet Take 1 tablet (30 mg total) by mouth at bedtime. 10/07/15   Gladstone Lighter, MD  oxyCODONE (OXY IR/ROXICODONE) 5 MG immediate release tablet Take 1 tablet (5 mg total) by mouth every 6 (six) hours as needed for moderate pain. 10/07/15   Gladstone Lighter, MD  senna-docusate (SENOKOT-S) 8.6-50 MG tablet Take 2 tablets by mouth 2 (two) times daily. 10/07/15   Gladstone Lighter, MD    No Known Allergies  Family History  Problem Relation Age of Onset  . Hypertension Other     Social History Social History  Substance Use Topics  . Smoking status: Never Smoker  . Smokeless tobacco: Never Used  . Alcohol use No    Review of Systems Constitutional: Negative for fever. Cardiovascular: Positive for chest pain. Respiratory: Negative for shortness of breath. Gastrointestinal: Negative for abdominal painAnd positive for nausea. Negative for vomiting and diarrhea. Genitourinary: Dialysis  patient. Musculoskeletal: Chronic back pain Neurological: Negative for headaches, focal weakness or numbness. 10-point ROS otherwise negative.  ____________________________________________   PHYSICAL EXAM:  VITAL SIGNS: ED Triage Vitals  Enc Vitals Group     BP 05/21/16 1626 138/87     Pulse Rate 05/21/16 1626 89     Resp 05/21/16 1626 20     Temp 05/21/16 1626 98.1 F (36.7 C)     Temp  Source 05/21/16 1626 Oral     SpO2 05/21/16 1626 99 %     Weight 05/21/16 1626 250 lb (113.4 kg)     Height 05/21/16 1626 6' (1.829 m)     Head Circumference --      Peak Flow --      Pain Score 05/21/16 1627 8     Pain Loc --      Pain Edu? --      Excl. in [place]? --     Constitutional: Alert and oriented. Mild distress due to pain which she describes as being "all over." Eyes: Normal exam ENT   Head: Normocephalic and atraumatic.   Mouth/Throat: Mucous membranes are moist. Cardiovascular: Normal rate, regular rhythm. Left subclavian catheter present. Respiratory: Normal respiratory effort without tachypnea nor retractions. Breath sounds are clear  Gastrointestinal: Soft and nontender. No distention.  Musculoskeletal: Nontender with normal range of motion in all extremities.  Neurologic:  Normal speech and language. No gross focal neurologic deficits Skin:  Skin is warm, dry and intact.  Psychiatric: Mood and affect are normal.   ____________________________________________    EKG  EKG reviewed and interpreted, social is normal sinus rhythm at 84 bpm, narrow QRS, normal axis, normal intervals, no concerning ST changes. Overall normal/reassuring EKG.  ____________________________________________    RADIOLOGY  Chest x-ray shows no acute abnormality  ____________________________________________   INITIAL IMPRESSION / ASSESSMENT AND PLAN / ED COURSE  Pertinent labs & imaging results that were available during my care of the patient were reviewed by me and considered in my medical decision making (see chart for details).  The patient presents the emergency department with chest pain and pain all over after dialysis session. We'll check labs, chest x-ray, EKG and closely monitor in the emergency department. We'll treat the patient's pain while awaiting results.   Patient's labs are largely at baseline. Second troponin is negative. Patient is feeling much better. We'll  discharge the patient home with PCP follow-up. ____________________________________________   FINAL CLINICAL IMPRESSION(S) / ED DIAGNOSES  Chest pain    Harvest Dark, MD 05/21/16 1930

## 2016-06-28 ENCOUNTER — Other Ambulatory Visit
Admission: RE | Admit: 2016-06-28 | Discharge: 2016-06-28 | Disposition: A | Discharge status: Home / Self Care | Payer: No Typology Code available for payment source | Source: Ambulatory Visit | Attending: Family Medicine | Admitting: Family Medicine

## 2016-06-28 DIAGNOSIS — R05 Cough: Secondary | ICD-10-CM | POA: Insufficient documentation

## 2016-07-01 LAB — LEGIONELLA PNEUMOPHILA SEROGP 1 UR AG: L. PNEUMOPHILA SEROGP 1 UR AG: NEGATIVE

## 2016-07-11 ENCOUNTER — Emergency Department: Payer: Medicare Other

## 2016-07-11 ENCOUNTER — Emergency Department
Admission: EM | Admit: 2016-07-11 | Discharge: 2016-07-11 | Disposition: A | Discharge status: Home / Self Care | Payer: Medicare Other | Attending: Emergency Medicine | Admitting: Emergency Medicine

## 2016-07-11 ENCOUNTER — Encounter: Payer: Self-pay | Admitting: Medical Oncology

## 2016-07-11 DIAGNOSIS — Z79899 Other long term (current) drug therapy: Secondary | ICD-10-CM | POA: Insufficient documentation

## 2016-07-11 DIAGNOSIS — I160 Hypertensive urgency: Secondary | ICD-10-CM | POA: Diagnosis not present

## 2016-07-11 DIAGNOSIS — Z452 Encounter for adjustment and management of vascular access device: Secondary | ICD-10-CM | POA: Insufficient documentation

## 2016-07-11 DIAGNOSIS — Z7982 Long term (current) use of aspirin: Secondary | ICD-10-CM | POA: Insufficient documentation

## 2016-07-11 MED ORDER — BACITRACIN ZINC 500 UNIT/GM EX OINT
TOPICAL_OINTMENT | Freq: Once | CUTANEOUS | Status: AC
  Administered 2016-07-11: 1 via TOPICAL

## 2016-07-11 MED ORDER — BACITRACIN ZINC 500 UNIT/GM EX OINT: TOPICAL_OINTMENT | Freq: Two times a day (BID) | CUTANEOUS | Status: DC

## 2016-07-11 MED ORDER — BACITRACIN ZINC 500 UNIT/GM EX OINT
TOPICAL_OINTMENT | CUTANEOUS | Status: AC
  Administered 2016-07-11: 1 via TOPICAL
  Filled 2016-07-11: qty 0.9

## 2016-07-11 NOTE — ED Notes (Addendum)
Right single lumen line removed per clinical guidelines. Pt tolerated procedure well with no complications. Line length 26cm after removal. On review of [facility] documents, I was unable to find cath length on insertion. MD notified and chest xray ordered. Line site dressing bacitracin ointment, sterile gauze, and transparent occlusive dressing.

## 2016-07-11 NOTE — ED Notes (Addendum)
Line insertion site, right neck without s/s of bleeding or swelling. Trachea midline. Neck without crepitus. No resp. issues.

## 2016-07-11 NOTE — ED Triage Notes (Signed)
Pt sent here from [facility] to have picc line removed. Denies any problems.

## 2016-07-11 NOTE — ED Provider Notes (Signed)
[facility] Emergency Department Provider Note  Time seen: 4:58 PM  I have reviewed the triage vital signs and the nursing notes.   HISTORY  Chief Complaint Vascular Access Problem    HPI Ronnie Keith is a 55 y.o. male with multiple medical problems including end-stage renal disease on hemodialysis, recently diagnosed with sepsis placed on IV antibiotics which were discontinued 07/05/16, who presents to the emergency department for removal of right-sided power line. According to record review the patient was admitted to [facility] 06/16/16 for sepsis, thought to be due to an infected fistula. Blood cultures were positive for bacteria, a right-sided paralyzed and was placed by interventional radiology 06/24/16 and the patient was discharged to [facility] for continued IV antibiotics. Patient was receiving Unasyn via the right-sided power line until 07/05/16 at which time they were discontinued. Most recent blood cultures have grown out negative. Patient is set to be discharged tomorrow so they sent the patient to the emergency department today for removal of the power line. Patient denies any pain to the area, no signs of infection. Denies fever. Patient is a rather poor historian.  Past Medical History:  Diagnosis Date  . Anxiety   . Chronic low back pain   . Hypertension   . Sciatica   . Scoliosis     Patient Active Problem List   Diagnosis Date Noted  . Depression, major, recurrent, moderate ([place]) 09/28/2015  . Adjustment disorder with mixed anxiety and depressed mood 09/28/2015  . Hypertensive urgency 09/20/2015  . Acute kidney injury ([place]) 09/20/2015  . Hyperkalemia 09/20/2015    Past Surgical History:  Procedure Laterality Date  . PERIPHERAL VASCULAR CATHETERIZATION N/A 09/29/2015   Procedure: Dialysis/Perma Catheter Insertion;  Surgeon: Katha Cabal, MD;  Location: [place] [facility];  Service: Cardiovascular;   Laterality: N/A;    Prior to Admission medications   Medication Sig Start Date End Date Taking? Authorizing Provider  acetaminophen (TYLENOL) 325 MG tablet Take 650 mg by mouth every 6 (six) hours as needed for mild pain or moderate pain.    Historical Provider, MD  ALPRAZolam Duanne Moron) 1 MG tablet Take 1 tablet (1 mg total) by mouth 2 (two) times daily as needed for anxiety. Patient not taking: Reported on 05/21/2016 10/07/15   Gladstone Lighter, MD  aspirin EC 81 MG tablet Take 81 mg by mouth daily.    Historical Provider, MD  fluticasone (FLONASE) 50 MCG/ACT nasal spray Place 1 spray into both nostrils daily.    Historical Provider, MD  gabapentin (NEURONTIN) 300 MG capsule Take 1 capsule (300 mg total) by mouth 2 (two) times daily. 10/07/15   Gladstone Lighter, MD  MELATONIN PO Take 1 tablet by mouth daily.    Historical Provider, MD  metoprolol succinate (TOPROL-XL) 50 MG 24 hr tablet Take 50 mg by mouth daily. 03/28/16 05/06/21  Historical Provider, MD  mirtazapine (REMERON) 30 MG tablet Take 1 tablet (30 mg total) by mouth at bedtime. 10/07/15   Gladstone Lighter, MD  omeprazole (PRILOSEC) 40 MG capsule Take 40 mg by mouth daily.    Historical Provider, MD  oxyCODONE (OXY IR/ROXICODONE) 5 MG immediate release tablet Take 1 tablet (5 mg total) by mouth every 6 (six) hours as needed for moderate pain. 10/07/15   Gladstone Lighter, MD  senna-docusate (SENOKOT-S) 8.6-50 MG tablet Take 2 tablets by mouth 2 (two) times daily. 10/07/15   Gladstone Lighter, MD    No Known Allergies  Family History  Problem Relation Age of Onset  . Hypertension Other     Social History Social History  Substance Use Topics  . Smoking status: Never Smoker  . Smokeless tobacco: Never Used  . Alcohol use No    Review of Systems Constitutional: Negative for fever Cardiovascular: Negative for chest pain. Respiratory: Negative for shortness of breath. Gastrointestinal: Negative for abdominal pain 10-point ROS otherwise  negative.  ____________________________________________   PHYSICAL EXAM:  VITAL SIGNS: ED Triage Vitals  Enc Vitals Group     BP 07/11/16 1612 122/83     Pulse Rate 07/11/16 1612 91     Resp 07/11/16 1612 16     Temp --      Temp src --      SpO2 07/11/16 1612 96 %     Weight 07/11/16 1609 260 lb (117.9 kg)     Height 07/11/16 1609 6' (1.829 m)     Head Circumference --      Peak Flow --      Pain Score --      Pain Loc --      Pain Edu? --      Excl. in [place]? --     Constitutional: Alert and oriented. Well appearing and in no distress. Eyes: Normal exam ENT   Head: Normocephalic and atraumatic.   Mouth/Throat: Mucous membranes are moist. Cardiovascular: Normal rate, regular rhythm. No murmur Respiratory: Normal respiratory effort without tachypnea nor retractions. Breath sounds are clear. Right-sided tunneled subclavian/jugular line present. Gastrointestinal: Soft and nontender. No distention.  Musculoskeletal: Nontender with normal range of motion in all extremities.  Neurologic:  Normal speech and language. No gross focal neurologic deficits Skin:  Skin is warm, dry and intact.  Psychiatric: Mood and affect are normal.  ____________________________________________     RADIOLOGY  Chest x-ray normal  ____________________________________________   INITIAL IMPRESSION / ASSESSMENT AND PLAN / ED COURSE  Pertinent labs & imaging results that were available during my care of the patient were reviewed by me and considered in my medical decision making (see chart for details).  Patient presents for removal right-sided power line. Patient has no symptoms at this time. I have reviewed the patient's [facility] records for his admission and discharge. Overall the patient appears very well. We will remove the patient's tunneled In the emergency department, obtain a chest x-ray to ensure complete removal.  Chest x-ray normal. Successful removal of tunneled catheter. Patient  will be discharged back to his rehabilitation center.  ____________________________________________   FINAL CLINICAL IMPRESSION(S) / ED DIAGNOSES  Powerline removal    Harvest Dark, MD 07/11/16 [phone]

## 2016-07-11 NOTE — ED Notes (Signed)
Pts ride- mike: [phone]

## 2016-07-16 ENCOUNTER — Encounter: Payer: Self-pay | Admitting: Emergency Medicine

## 2016-07-16 ENCOUNTER — Emergency Department: Payer: Medicare Other

## 2016-07-16 ENCOUNTER — Emergency Department
Admission: EM | Admit: 2016-07-16 | Discharge: 2016-07-16 | Disposition: A | Discharge status: Other Acute Inpatient Hospital | Payer: Medicare Other | Attending: Emergency Medicine | Admitting: Emergency Medicine

## 2016-07-16 DIAGNOSIS — I10 Essential (primary) hypertension: Secondary | ICD-10-CM | POA: Diagnosis not present

## 2016-07-16 DIAGNOSIS — F79 Unspecified intellectual disabilities: Secondary | ICD-10-CM

## 2016-07-16 DIAGNOSIS — Z7982 Long term (current) use of aspirin: Secondary | ICD-10-CM | POA: Diagnosis not present

## 2016-07-16 DIAGNOSIS — F339 Major depressive disorder, recurrent, unspecified: Secondary | ICD-10-CM

## 2016-07-16 DIAGNOSIS — Z79899 Other long term (current) drug therapy: Secondary | ICD-10-CM | POA: Diagnosis not present

## 2016-07-16 DIAGNOSIS — F331 Major depressive disorder, recurrent, moderate: Secondary | ICD-10-CM

## 2016-07-16 DIAGNOSIS — R109 Unspecified abdominal pain: Secondary | ICD-10-CM | POA: Insufficient documentation

## 2016-07-16 DIAGNOSIS — F332 Major depressive disorder, recurrent severe without psychotic features: Secondary | ICD-10-CM | POA: Diagnosis not present

## 2016-07-16 DIAGNOSIS — F329 Major depressive disorder, single episode, unspecified: Secondary | ICD-10-CM | POA: Diagnosis present

## 2016-07-16 LAB — COMPREHENSIVE METABOLIC PANEL
ALT: 17 U/L (ref 17–63)
AST: 19 U/L (ref 15–41)
Albumin: 4.2 g/dL (ref 3.5–5.0)
Alkaline Phosphatase: 137 U/L — ABNORMAL HIGH (ref 38–126)
Anion gap: 8 (ref 5–15)
BUN: 26 mg/dL — ABNORMAL HIGH (ref 6–20)
CHLORIDE: 99 mmol/L — AB (ref 101–111)
CO2: 33 mmol/L — AB (ref 22–32)
CREATININE: 5.42 mg/dL — AB (ref 0.61–1.24)
Calcium: 8.5 mg/dL — ABNORMAL LOW (ref 8.9–10.3)
GFR, EST AFRICAN AMERICAN: 13 mL/min — AB (ref >60)
GFR, EST NON AFRICAN AMERICAN: 11 mL/min — AB (ref >60)
Glucose, Bld: 89 mg/dL (ref 65–99)
POTASSIUM: 4.1 mmol/L (ref 3.5–5.1)
SODIUM: 140 mmol/L (ref 135–145)
Total Bilirubin: 0.4 mg/dL (ref 0.3–1.2)
Total Protein: 8.1 g/dL (ref 6.5–8.1)

## 2016-07-16 LAB — URINALYSIS COMPLETE WITH MICROSCOPIC (ARMC ONLY)
BACTERIA UA: NONE SEEN
Bilirubin Urine: NEGATIVE
Glucose, UA: 50 mg/dL — AB
Hgb urine dipstick: NEGATIVE
Ketones, ur: NEGATIVE mg/dL
LEUKOCYTES UA: NEGATIVE
Nitrite: NEGATIVE
PH: 7 (ref 5.0–8.0)
PROTEIN: 100 mg/dL — AB
SQUAMOUS EPITHELIAL / LPF: NONE SEEN
Specific Gravity, Urine: 1.012 (ref 1.005–1.030)

## 2016-07-16 LAB — URINE DRUG SCREEN, QUALITATIVE (ARMC ONLY)
AMPHETAMINES, UR SCREEN: NOT DETECTED
Barbiturates, Ur Screen: NOT DETECTED
Benzodiazepine, Ur Scrn: NOT DETECTED
COCAINE METABOLITE, UR ~~LOC~~: NOT DETECTED
Cannabinoid 50 Ng, Ur ~~LOC~~: NOT DETECTED
MDMA (ECSTASY) UR SCREEN: NOT DETECTED
METHADONE SCREEN, URINE: NOT DETECTED
Opiate, Ur Screen: NOT DETECTED
Phencyclidine (PCP) Ur S: NOT DETECTED
TRICYCLIC, UR SCREEN: NOT DETECTED

## 2016-07-16 LAB — CBC
HCT: 34.4 % — ABNORMAL LOW (ref 40.0–52.0)
HEMOGLOBIN: 11.9 g/dL — AB (ref 13.0–18.0)
MCH: 32 pg (ref 26.0–34.0)
MCHC: 34.5 g/dL (ref 32.0–36.0)
MCV: 92.7 fL (ref 80.0–100.0)
PLATELETS: 129 10*3/uL — AB (ref 150–440)
RBC: 3.71 MIL/uL — AB (ref 4.40–5.90)
RDW: 15.1 % — ABNORMAL HIGH (ref 11.5–14.5)
WBC: 6.1 10*3/uL (ref 3.8–10.6)

## 2016-07-16 LAB — LIPASE, BLOOD: LIPASE: 44 U/L (ref 11–51)

## 2016-07-16 LAB — ACETAMINOPHEN LEVEL: Acetaminophen (Tylenol), Serum: <10 ug/mL — ABNORMAL LOW (ref 10–30)

## 2016-07-16 LAB — ETHANOL

## 2016-07-16 LAB — SALICYLATE LEVEL

## 2016-07-16 MED ORDER — IOPAMIDOL (ISOVUE-300) INJECTION 61%: 30.0000 mL | Freq: Once | INTRAVENOUS | Status: DC | PRN

## 2016-07-16 MED ORDER — MIRTAZAPINE 15 MG PO TABS
30.0000 mg | ORAL_TABLET | Freq: Every day | ORAL | Status: DC
  Administered 2016-07-16: 30 mg via ORAL
  Filled 2016-07-16: qty 2

## 2016-07-16 MED ORDER — METOPROLOL SUCCINATE ER 50 MG PO TB24
50.0000 mg | ORAL_TABLET | Freq: Every day | ORAL | Status: DC
  Administered 2016-07-16: 50 mg via ORAL
  Filled 2016-07-16: qty 1

## 2016-07-16 MED ORDER — IOPAMIDOL (ISOVUE-300) INJECTION 61%
15.0000 mL | INTRAVENOUS | Status: AC
  Administered 2016-07-16: 15 mL via ORAL

## 2016-07-16 MED ORDER — PANTOPRAZOLE SODIUM 40 MG PO TBEC
40.0000 mg | DELAYED_RELEASE_TABLET | Freq: Every day | ORAL | Status: DC
Start: 2016-07-16 — End: 2016-07-17
  Administered 2016-07-16: 40 mg via ORAL
  Filled 2016-07-16: qty 1

## 2016-07-16 NOTE — ED Provider Notes (Signed)
[facility] Emergency Department Provider Note   ____________________________________________   First MD Initiated Contact with Patient 07/16/16 1706     (approximate)  I have reviewed the triage vital signs and the nursing notes.   HISTORY  Chief Complaint Suicidal   HPI Ronnie Keith is a 55 y.o. male who is on dialysis. He says he was in the hospital recently for sepsis review of the old records confirms this. He says he just feels weak and tired and depressed doesn't really want to be here anymore is thinking maybe he should shoot himself. When asked if he has a gun he says he thinks so. On exam patient says his belly hurts somewhat. He coughs once since he thought he might of had a fever here but he doesn't. He looks sad and depressed.    Patient Active Problem List   Diagnosis Date Noted  . Intellectual disability 07/16/2016  . Moderate recurrent major depression ([place]) 09/28/2015  . Adjustment disorder with mixed anxiety and depressed mood 09/28/2015  . Hypertensive urgency 09/20/2015  . Acute kidney injury ([place]) 09/20/2015  . Hyperkalemia 09/20/2015    Past Surgical History:  Procedure Laterality Date  . PERIPHERAL VASCULAR CATHETERIZATION N/A 09/29/2015   Procedure: Dialysis/Perma Catheter Insertion;  Surgeon: Katha Cabal, MD;  Location: [place] [facility];  Service: Cardiovascular;  Laterality: N/A;    Prior to Admission medications   Medication Sig Start Date End Date Taking? Authorizing Provider  acetaminophen (TYLENOL) 325 MG tablet Take 650 mg by mouth every 6 (six) hours as needed for mild pain or moderate pain.    Historical Provider, MD  ALPRAZolam Duanne Moron) 1 MG tablet Take 1 tablet (1 mg total) by mouth 2 (two) times daily as needed for anxiety. Patient not taking: Reported on 05/21/2016 10/07/15   Gladstone Lighter, MD  aspirin EC 81 MG tablet Take 81 mg by mouth daily.    Historical Provider, MD  fluticasone (FLONASE) 50  MCG/ACT nasal spray Place 1 spray into both nostrils daily.    Historical Provider, MD  gabapentin (NEURONTIN) 300 MG capsule Take 1 capsule (300 mg total) by mouth 2 (two) times daily. 10/07/15   Gladstone Lighter, MD  MELATONIN PO Take 1 tablet by mouth daily.    Historical Provider, MD  metoprolol succinate (TOPROL-XL) 50 MG 24 hr tablet Take 50 mg by mouth daily. 03/28/16 05/06/21  Historical Provider, MD  mirtazapine (REMERON) 30 MG tablet Take 1 tablet (30 mg total) by mouth at bedtime. 10/07/15   Gladstone Lighter, MD  omeprazole (PRILOSEC) 40 MG capsule Take 40 mg by mouth daily.    Historical Provider, MD  oxyCODONE (OXY IR/ROXICODONE) 5 MG immediate release tablet Take 1 tablet (5 mg total) by mouth every 6 (six) hours as needed for moderate pain. 10/07/15   Gladstone Lighter, MD  senna-docusate (SENOKOT-S) 8.6-50 MG tablet Take 2 tablets by mouth 2 (two) times daily. 10/07/15   Gladstone Lighter, MD    Allergies Patient has no known allergies.  Family History  Problem Relation Age of Onset  . Hypertension Other     Social History Social History  Substance Use Topics  . Smoking status: Never Smoker  . Smokeless tobacco: Never Used  . Alcohol use No    Review of Systems Constitutional: No fever/chills Eyes: No visual changes. ENT: No sore throat. Cardiovascular: Denies chest pain. Respiratory: Denies shortness of breath. Gastrointestinal:  abdominal pain.   Genitourinary: Negative for dysuria. Musculoskeletal: Negative for back  pain. Skin: Negative for rash.  10-point ROS otherwise negative.  ____________________________________________   PHYSICAL EXAM:  VITAL SIGNS: ED Triage Vitals  Enc Vitals Group     BP 07/16/16 1602 126/85     Pulse Rate 07/16/16 1602 84     Resp 07/16/16 1602 18     Temp 07/16/16 1602 97.9 F (36.6 C)     Temp Source 07/16/16 1602 Oral     SpO2 07/16/16 1602 100 %     Weight 07/16/16 1602 255 lb (115.7 kg)     Height 07/16/16 1602 6' (1.829  m)     Head Circumference --      Peak Flow --      Pain Score 07/16/16 1603 9     Pain Loc --      Pain Edu? --      Excl. in [place]? --     Constitutional: Alert and oriented. Well appearing and in no acute distress. Eyes: Conjunctivae are normal. PERRL. EOMI. Head: Atraumatic. Nose: No congestion/rhinnorhea. Mouth/Throat: Mucous membranes are moist.  Oropharynx non-erythematous. Neck: No stridor Cardiovascular: Normal rate, regular rhythm. Grossly normal heart sounds.  Good peripheral circulation. Respiratory: Normal respiratory effort.  No retractions. Lungs CTAB. Gastrointestinal: Soft and nontender. No distention. No abdominal bruits. No CVA tenderness. Musculoskeletal: No lower extremity tenderness nor edema.  No joint effusions. Neurologic:  Normal speech and language. No gross focal neurologic deficits are appreciated. No gait instability.  ____________________________________________   LABS (all labs ordered are listed, but only abnormal results are displayed)  Labs Reviewed  COMPREHENSIVE METABOLIC PANEL - Abnormal; Notable for the following:       Result Value   Chloride 99 (*)    CO2 33 (*)    BUN 26 (*)    Creatinine, Ser 5.42 (*)    Calcium 8.5 (*)    Alkaline Phosphatase 137 (*)    GFR calc non Af Amer 11 (*)    GFR calc Af Amer 13 (*)    All other components within normal limits  ACETAMINOPHEN LEVEL - Abnormal; Notable for the following:    Acetaminophen (Tylenol), Serum <10 (*)    All other components within normal limits  CBC - Abnormal; Notable for the following:    RBC 3.71 (*)    Hemoglobin 11.9 (*)    HCT 34.4 (*)    RDW 15.1 (*)    Platelets 129 (*)    All other components within normal limits  ETHANOL  SALICYLATE LEVEL  URINE DRUG SCREEN, QUALITATIVE ([facility] ONLY)  URINALYSIS COMPLETEWITH MICROSCOPIC ([facility] ONLY)  LIPASE, BLOOD    ____________________________________________  EKG   ____________________________________________  RADIOLOGY  Study Result   CLINICAL DATA:  Cough for several days. Weakness. Abdominal pain. On dialysis.  EXAM: CHEST  2 VIEW  COMPARISON:  07/11/2016  FINDINGS: Low lung volumes noted. Both lungs appear clear. Heart size is stable. Lordotic positioning noted.  IMPRESSION: Stable low lung volumes.  No active lung disease.   Electronically Signed   By: Earle Gell M.D.   On: 07/16/2016 17:34    Study Result   CLINICAL DATA:  Cough, generalized weakness and abdominal pain. Suicidal ideation. End-stage renal disease on dialysis.  EXAM: CT ABDOMEN AND PELVIS WITHOUT CONTRAST  TECHNIQUE: Multidetector CT imaging of the abdomen and pelvis was performed following the standard protocol without IV contrast.  COMPARISON:  CT abdomen and pelvis September 21, 2015.  FINDINGS: LOWER CHEST: 5 mm RIGHT lower lobe ground-glass nodule, below size  followup recommendation. Heart size is normal. No pericardial effusions. The visualized heart size is normal. No pericardial effusion.  HEPATOBILIARY: Normal.  PANCREAS: Normal.  SPLEEN: Normal.  ADRENALS/URINARY TRACT: Kidneys are orthotopic, stable symmetric atrophy. Multiple bilateral renal cysts measure up to 16 mm LEFT lower pole. No nephrolithiasis, hydronephrosis; limited assessment for renal masses on this nonenhanced examination. The unopacified ureters are normal in course and caliber. Urinary bladder is partially distended and unremarkable. Normal adrenal glands.  STOMACH/BOWEL: The stomach, small and large bowel are normal in course and caliber without inflammatory changes.  VASCULAR/LYMPHATIC: Aortoiliac vessels are normal in course and caliber. No lymphadenopathy by CT size criteria.  REPRODUCTIVE: Dystrophic prostatic calcifications, prostate is not enlarged.  OTHER: No intraperitoneal  free fluid or free air.  MUSCULOSKELETAL: Non-acute. Small fat containing LEFT inguinal hernia. Small fat containing inguinal hernia. Multiple small soft tissue nodules anterior abdominal wall most consistent with injection granulomas. Broad lumbar dextroscoliosis. Moderate to severe degenerative changes lumbar spine. Scattered Schmorl's nodes. Severe LEFT L3-4 and L4-5 neural foraminal narrowing. Severe RIGHT greater than LEFT L5-S1 neural foraminal narrowing. 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  IMPRESSION: No acute intra-abdominal or pelvic process. Atrophic kidneys without urolithiasis or obstructive uropathy.  Multilevel severe neural foraminal narrowing.   Electronically Signed   By: Elon Alas M.D.   On: 07/16/2016 20:00     ____________________________________________   PROCEDURES  Procedure(s) performed:  Procedures  Critical Care performed:   ____________________________________________   INITIAL IMPRESSION / ASSESSMENT AND PLAN / ED COURSE  Pertinent labs & imaging results that were available during my care of the patient were reviewed by me and considered in my medical decision making (see chart for details).    Clinical Course      ____________________________________________   FINAL CLINICAL IMPRESSION(S) / ED DIAGNOSES  Final diagnoses:  Episode of recurrent major depressive disorder, unspecified depression episode severity ([place])      NEW MEDICATIONS STARTED DURING THIS VISIT:  New Prescriptions   No medications on file     Note:  This document was prepared using Dragon voice recognition software and may include unintentional dictation errors.    Nena Polio, MD 07/16/16 [phone]

## 2016-07-16 NOTE — ED Triage Notes (Signed)
Pt brought in by [facility] with IVC papers in hand with thoughts of committing suicide by shooting himself with a gun. Pt is depressed and tired of being sick. Pt currently on dialysis and lives at home with his mother.

## 2016-07-16 NOTE — BH Assessment (Signed)
Patient is to be admitted to [facility] by Dr. Weber Cooks.  Attending Physician will be Dr. Jerilee Hoh.   Patient has been assigned to room 324, by [place]   Intake Paper Work has been signed and placed on patient chart.  ER staff is aware of the admission Legrand Pitts, ER Sect.; Dr. Cinda Quest, ER MD; Gwynn Burly., Patient's Nurse & Verdis Frederickson, Patient Access).

## 2016-07-16 NOTE — ED Notes (Signed)
BEHAVIORAL HEALTH ROUNDING Patient sleeping: No. Patient alert and oriented: yes Behavior appropriate: Yes.  ; If no, describe:  Nutrition and fluids offered: yes Toileting and hygiene offered: Yes  Sitter present: q15 minute observations and security  monitoring Law enforcement present: Yes  ODS  

## 2016-07-16 NOTE — ED Notes (Signed)

## 2016-07-16 NOTE — BH Assessment (Signed)
Assessment Note  Ronnie Keith is an 55 y.o. male who presents to the ER via EMS, after voicing SI while at dialysis. Patient states he has dealt with depression for some time and haven't addressed it. Due to the decline of his physical health, his depression has worsened. Patient reports of having plan of overdosing on medications. Per his report, he haven't received any form of mental health treatment in an outpatient or inpatient setting.  During the interview, the patient was calm, cooperative and pleasant.   Diagnosis: Depression  Past Medical History:  Past Medical History:  Diagnosis Date  . Anxiety   . Chronic low back pain   . Hypertension   . Sciatica   . Scoliosis     Past Surgical History:  Procedure Laterality Date  . PERIPHERAL VASCULAR CATHETERIZATION N/A 09/29/2015   Procedure: Dialysis/Perma Catheter Insertion;  Surgeon: Katha Cabal, MD;  Location: [facility];  Service: Cardiovascular;  Laterality: N/A;    Family History:  Family History  Problem Relation Age of Onset  . Hypertension Other     Social History:  reports that he has never smoked. He has never used smokeless tobacco. He reports that he does not drink alcohol or use drugs.  Additional Social History:  Alcohol / Drug Use Pain Medications: See PTA Prescriptions: See PTA Over the Counter: See PTA History of alcohol / drug use?: No history of alcohol / drug abuse Longest period of sobriety (when/how long): Reports of none Negative Consequences of Use:  (Reports of none) Withdrawal Symptoms:  (Reports of none)  CIWA: CIWA-Ar BP: 126/85 Pulse Rate: 84 COWS:    Allergies: No Known Allergies  Home Medications:  (Not in a hospital admission)  OB/GYN Status:  No LMP for male patient.  General Assessment Data Location of Assessment: [facility] ED TTS Assessment: In system Is this a Tele or Face-to-Face Assessment?: Face-to-Face Is this an Initial Assessment or a Re-assessment for this  encounter?: Initial Assessment Marital status: Single Maiden name: n/a Is patient pregnant?: No Living Arrangements: Parent Can pt return to current living arrangement?: Yes Admission Status: Involuntary Is patient capable of signing voluntary admission?: No Referral Source: Self/Family/Friend Insurance type: Medicare  Medical Screening Exam ([place]) Medical Exam completed: Yes  Crisis Care Plan Living Arrangements: Parent Legal Guardian: Other: (None) Name of Psychiatrist: Reports of none Name of Therapist: Reports of none  Education Status Is patient currently in school?: No Current Grade: n/a Highest grade of school patient has completed: Unknown Name of school: n/a Contact person: n/a  Risk to self with the past 6 months Suicidal Ideation: Yes-Currently Present Has patient been a risk to self within the past 6 months prior to admission? : Yes Suicidal Intent: No-Not Currently/Within Last 6 Months Has patient had any suicidal intent within the past 6 months prior to admission? : No Is patient at risk for suicide?: Yes Suicidal Plan?: Yes-Currently Present Has patient had any suicidal plan within the past 6 months prior to admission? : Yes Specify Current Suicidal Plan: Overdose on medication Access to Means: Yes Specify Access to Suicidal Means: Have a Medications What has been your use of drugs/alcohol within the last 12 months?: Reports of None Previous Attempts/Gestures: No How many times?: 0 Other Self Harm Risks: Reports of none Triggers for Past Attempts: Unknown Intentional Self Injurious Behavior: None Family Suicide History: No Recent stressful life event(s): Recent negative physical changes, Other (Comment) Persecutory voices/beliefs?: No Depression: Yes Depression Symptoms: Feeling  worthless/self pity, Loss of interest in usual pleasures, Guilt, Fatigue, Isolating, Tearfulness Substance abuse history and/or treatment for substance abuse?:  No Suicide prevention information given to non-admitted patients: Not applicable  Risk to Others within the past 6 months Homicidal Ideation: No Does patient have any lifetime risk of violence toward others beyond the six months prior to admission? : No Thoughts of Harm to Others: No Current Homicidal Intent: No Current Homicidal Plan: No Access to Homicidal Means: No Identified Victim: Reports of none History of harm to others?: No Assessment of Violence: None Noted Violent Behavior Description: Reports of none Does patient have access to weapons?: No Criminal Charges Pending?: No Does patient have a court date: No Is patient on probation?: No  Psychosis Hallucinations: None noted Delusions: None noted  Mental Status Report Appearance/Hygiene: Unremarkable, In scrubs Eye Contact: Fair Motor Activity: Freedom of movement, Unremarkable Speech: Logical/coherent, Soft, Slow Level of Consciousness: Alert Mood: Depressed, Anxious, Sad, Helpless, Pleasant Affect: Anxious, Appropriate to circumstance, Depressed, Sad Anxiety Level: Minimal Thought Processes: Coherent, Relevant Judgement: Unimpaired Orientation: Person, Place, Time, Situation, Appropriate for developmental age, Not oriented Obsessive Compulsive Thoughts/Behaviors: Minimal  Cognitive Functioning Concentration: Normal Memory: Recent Intact, Remote Intact IQ: Average Insight: Fair Impulse Control: Poor Appetite: Fair Weight Loss: 0 Weight Gain: 0 Sleep: No Change Total Hours of Sleep: 5 Vegetative Symptoms: None  ADLScreening [facility] Assessment Services) Patient's cognitive ability adequate to safely complete daily activities?: Yes Patient able to express need for assistance with ADLs?: Yes Independently performs ADLs?: Yes (appropriate for developmental age)  Prior Inpatient Therapy Prior Inpatient Therapy: No Prior Therapy Dates: Reports of none Prior Therapy Facilty/Provider(s): Reports of none Reason  for Treatment: Reports of none  Prior Outpatient Therapy Prior Outpatient Therapy: No Prior Therapy Dates: Reports of none Prior Therapy Facilty/Provider(s): Reports of none Reason for Treatment: Reports of none Does patient have an ACCT team?: No Does patient have Intensive In-House Services?  : No Does patient have [facility] services? : No Does patient have P4CC services?: No  ADL Screening (condition at time of admission) Patient's cognitive ability adequate to safely complete daily activities?: Yes Is the patient deaf or have difficulty hearing?: No Does the patient have difficulty seeing, even when wearing glasses/contacts?: No Does the patient have difficulty concentrating, remembering, or making decisions?: No Patient able to express need for assistance with ADLs?: Yes Does the patient have difficulty dressing or bathing?: No Independently performs ADLs?: Yes (appropriate for developmental age) Does the patient have difficulty walking or climbing stairs?: No Weakness of Legs: None Weakness of Arms/Hands: None  Home Assistive Devices/Equipment Home Assistive Devices/Equipment: None  Therapy Consults (therapy consults require a physician order) PT Evaluation Needed: No OT Evalulation Needed: No SLP Evaluation Needed: No Abuse/Neglect Assessment (Assessment to be complete while patient is alone) Physical Abuse: Denies Verbal Abuse: Denies Sexual Abuse: Denies Exploitation of patient/patient's resources: Denies Self-Neglect: Denies Values / Beliefs Cultural Requests During Hospitalization: None Spiritual Requests During Hospitalization: None Consults Spiritual Care Consult Needed: No Social Work Consult Needed: No      Additional Information 1:1 In Past 12 Months?: No CIRT Risk: No Elopement Risk: No Does patient have medical clearance?: Yes  Child/Adolescent Assessment Running Away Risk: Denies (Reports of none)  Disposition:  Disposition Initial Assessment  Completed for this Encounter: Yes Disposition of Patient: Other dispositions (ER MD ordered Psych consult)  On Site Evaluation by:   Reviewed with Physician:    Gunnar Fusi MS, LCAS, LPC, [place], CCSI Therapeutic Triage  Specialist 07/16/2016 8:02 PM

## 2016-07-16 NOTE — ED Notes (Signed)
Patient transported to CT at this time. 

## 2016-07-16 NOTE — Consult Note (Signed)
[facility] Consult   Reason for Consult:  Consult for 55 year old man with history of developmental disability and depression sent over from dialysis because of suicidal statements Referring Physician:  Cinda Quest Patient Identification: Ronnie Keith MRN:  [id number] Principal Diagnosis: Moderate recurrent major depression ([place]) Diagnosis:   Patient Active Problem List   Diagnosis Date Noted  . Intellectual disability [F79] 07/16/2016  . Moderate recurrent major depression ([place]) [F33.1] 09/28/2015  . Adjustment disorder with mixed anxiety and depressed mood [F43.23] 09/28/2015  . Hypertensive urgency [I16.0] 09/20/2015  . Acute kidney injury ([place]) [N17.9] 09/20/2015  . Hyperkalemia [E87.5] 09/20/2015    Total Time spent with patient: 1 hour  Subjective:   Ronnie Keith is a 55 y.o. male patient admitted with "I just don't feel good".  HPI:  Patient interviewed. Chart reviewed. Spoke to his mother as well. 30 year old man on dialysis. He went to dialysis today and made statements suggesting suicidal ideation. Today he tells me that he woke up this morning feeling like maybe he just wished that he weren't here anymore. He says that he vaguely thought of taking extra pills but did not act on it. Patient is intellectually disabled and is not able to go into much detail about all of this. He says he hasn't been feeling well for a while but won't be any more specific than that. Doesn't sleep well. Feels tired. I spoke with his mother and she said that he's been off of his antidepressant for a while and seems more run down and negative. He's been making statements at home about feeling like he is useless. Mother has been trying to encourage him to keep his spirits up but knows that he is feeling more negative. He is particularly unhappy about having to continue dialysis. No evidence of psychosis. No violence.  Social history: Patient lives with his mother. He is chronically intellectually  disabled. Doesn't have much other support or much else going on.  Medical history: He is on dialysis for end-stage renal disease. Has history of hypertension.  Substance abuse history: None  Past Psychiatric History: I saw this patient before when he was in the hospital around the time he was starting dialysis. He was getting down and feeling depressed at that time although his mood would come and go depending on the situation and the day. He does not have any history of actually trying to kill himself. Last year I put him on some mirtazapine that seems to of been at least partially helpful. No history of psychosis. 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 Anxiety   . Chronic low back pain   . Hypertension   . Sciatica   . Scoliosis     Past Surgical History:  Procedure Laterality Date  . PERIPHERAL VASCULAR CATHETERIZATION N/A 09/29/2015   Procedure: Dialysis/Perma Catheter Insertion;  Surgeon: Katha Cabal, MD;  Location: [facility];  Service: Cardiovascular;  Laterality: N/A;   Family History:  Family History  Problem Relation Age of Onset  . Hypertension Other    Family Psychiatric  History: None identified Social History:  History  Alcohol Use No     History  Drug Use No  Social History   Social History  . Marital status: Single    Spouse name: N/A  . Number of children: N/A  . Years of education: N/A   Social History Main Topics  . Smoking status: Never Smoker  . Smokeless tobacco: Never Used  . Alcohol use No  . Drug use: No  . Sexual activity: Not Currently   Other Topics Concern  . None   Social History Narrative  . None   Additional Social History:     Allergies:  No Known Allergies  Labs:  Results for orders placed or performed during the hospital encounter of 07/16/16 (from the past 48 hour(s))  Comprehensive metabolic panel     Status: Abnormal   Collection Time: 07/16/16  4:05 PM  Result Value Ref Range   Sodium 140 135 - 145 mmol/L   Potassium 4.1 3.5 - 5.1 mmol/L   Chloride 99 (L) 101 - 111 mmol/L   CO2 33 (H) 22 - 32 mmol/L   Glucose, Bld 89 65 - 99 mg/dL   BUN 26 (H) 6 - 20 mg/dL   Creatinine, Ser 5.42 (H) 0.61 - 1.24 mg/dL   Calcium 8.5 (L) 8.9 - 10.3 mg/dL   Total Protein 8.1 6.5 - 8.1 g/dL   Albumin 4.2 3.5 - 5.0 g/dL   AST 19 15 - 41 U/L   ALT 17 17 - 63 U/L   Alkaline Phosphatase 137 (H) 38 - 126 U/L   Total Bilirubin 0.4 0.3 - 1.2 mg/dL   GFR calc non Af Amer 11 (L) >60 mL/min   GFR calc Af Amer 13 (L) >60 mL/min    Comment: (NOTE) The eGFR has been calculated using the CKD EPI equation. This calculation has not been validated in all clinical situations. eGFR's persistently <60 mL/min signify possible Chronic Kidney Disease.    Anion gap 8 5 - 15  Ethanol     Status: None   Collection Time: 07/16/16  4:05 PM  Result Value Ref Range   Alcohol, Ethyl (B) <5 <5 mg/dL    Comment:        LOWEST DETECTABLE LIMIT FOR SERUM ALCOHOL IS 5 mg/dL FOR MEDICAL PURPOSES ONLY   Salicylate level     Status: None   Collection Time: 07/16/16  4:05 PM  Result Value Ref Range   Salicylate Lvl <8.5 2.8 - 30.0 mg/dL  Acetaminophen level     Status: Abnormal   Collection Time: 07/16/16  4:05 PM  Result Value Ref Range   Acetaminophen (Tylenol), Serum <10 (L) 10 - 30 ug/mL    Comment:        THERAPEUTIC CONCENTRATIONS VARY SIGNIFICANTLY. A RANGE OF 10-30 ug/mL MAY BE AN EFFECTIVE CONCENTRATION FOR MANY PATIENTS. HOWEVER, SOME ARE BEST TREATED AT CONCENTRATIONS OUTSIDE THIS RANGE. ACETAMINOPHEN CONCENTRATIONS >150 ug/mL AT 4 HOURS AFTER INGESTION AND >50 ug/mL AT 12 HOURS AFTER INGESTION ARE OFTEN ASSOCIATED WITH  TOXIC REACTIONS.   cbc     Status: Abnormal   Collection Time: 07/16/16  4:05 PM  Result Value Ref Range   WBC 6.1 3.8 - 10.6 K/uL   RBC 3.71 (L) 4.40 - 5.90 MIL/uL   Hemoglobin 11.9 (L) 13.0 - 18.0 g/dL   HCT 34.4 (L) 40.0 - 52.0 %   MCV 92.7 80.0 - 100.0 fL   MCH 32.0 26.0 - 34.0 pg   MCHC 34.5 32.0 - 36.0 g/dL   RDW 15.1 (H) 11.5 - 14.5 %   Platelets 129 (L) 150 -  440 K/uL  Urine Drug Screen, Qualitative     Status: None   Collection Time: 07/16/16  4:15 PM  Result Value Ref Range   Tricyclic, Ur Screen NONE DETECTED NONE DETECTED   Amphetamines, Ur Screen NONE DETECTED NONE DETECTED   MDMA (Ecstasy)Ur Screen NONE DETECTED NONE DETECTED   Cocaine Metabolite,Ur [place] NONE DETECTED NONE DETECTED   Opiate, Ur Screen NONE DETECTED NONE DETECTED   Phencyclidine (PCP) Ur S NONE DETECTED NONE DETECTED   Cannabinoid 50 Ng, Ur [place] NONE DETECTED NONE DETECTED   Barbiturates, Ur Screen NONE DETECTED NONE DETECTED   Benzodiazepine, Ur Scrn NONE DETECTED NONE DETECTED   Methadone Scn, Ur NONE DETECTED NONE DETECTED    Comment: (NOTE) 932  Tricyclics, urine               Cutoff 1000 ng/mL 200  Amphetamines, urine             Cutoff 1000 ng/mL 300  MDMA (Ecstasy), urine           Cutoff 500 ng/mL 400  Cocaine Metabolite, urine       Cutoff 300 ng/mL 500  Opiate, urine                   Cutoff 300 ng/mL 600  Phencyclidine (PCP), urine      Cutoff 25 ng/mL 700  Cannabinoid, urine              Cutoff 50 ng/mL 800  Barbiturates, urine             Cutoff 200 ng/mL 900  Benzodiazepine, urine           Cutoff 200 ng/mL 1000 Methadone, urine                Cutoff 300 ng/mL 1100 1200 The urine drug screen provides only a preliminary, unconfirmed 1300 analytical test result and should not be used for non-medical 1400 purposes. Clinical consideration and professional judgment should 1500 be applied to any positive drug screen result due to possible 1600 interfering substances. A more specific  alternate chemical method 1700 must be used in order to obtain a confirmed analytical result.  1800 Gas chromato graphy / mass spectrometry (GC/MS) is the preferred 1900 confirmatory method.     Current Facility-Administered Medications  Medication Dose Route Frequency Provider Last Rate Last Dose  . iopamidol (ISOVUE-300) 61 % injection 15 mL  15 mL Oral Q1 Hr x 2 Nena Polio, MD      . metoprolol succinate (TOPROL-XL) 24 hr tablet 50 mg  50 mg Oral Daily Gonzella Lex, MD      . mirtazapine (REMERON) tablet 30 mg  30 mg Oral QHS Gonzella Lex, MD      . pantoprazole (PROTONIX) EC tablet 40 mg  40 mg Oral Daily Gonzella Lex, MD       Current Outpatient Prescriptions  Medication Sig Dispense Refill  . acetaminophen (TYLENOL) 325 MG tablet Take 650 mg by mouth every 6 (six) hours as needed for mild pain or moderate pain.    Marland Kitchen ALPRAZolam (XANAX) 1 MG tablet Take 1 tablet (1 mg total) by mouth 2 (two) times daily as needed for anxiety. (Patient not taking: Reported on 05/21/2016) 20 tablet 0  . aspirin EC 81 MG tablet Take 81 mg by mouth daily.    . fluticasone (FLONASE) 50 MCG/ACT nasal spray Place 1 spray into both nostrils daily.    Marland Kitchen gabapentin (NEURONTIN)  300 MG capsule Take 1 capsule (300 mg total) by mouth 2 (two) times daily. 60 capsule 2  . MELATONIN PO Take 1 tablet by mouth daily.    . metoprolol succinate (TOPROL-XL) 50 MG 24 hr tablet Take 50 mg by mouth daily.    . mirtazapine (REMERON) 30 MG tablet Take 1 tablet (30 mg total) by mouth at bedtime. 30 tablet 2  . omeprazole (PRILOSEC) 40 MG capsule Take 40 mg by mouth daily.    Marland Kitchen oxyCODONE (OXY IR/ROXICODONE) 5 MG immediate release tablet Take 1 tablet (5 mg total) by mouth every 6 (six) hours as needed for moderate pain. 20 tablet 0  . senna-docusate (SENOKOT-S) 8.6-50 MG tablet Take 2 tablets by mouth 2 (two) times daily. 60 tablet 2    Musculoskeletal: Strength & Muscle Tone: within normal limits Gait & Station:  ataxic Patient leans: N/A  Psychiatric Specialty Exam: Physical Exam  Nursing note and vitals reviewed. Constitutional: He appears well-developed and well-nourished.  HENT:  Head: Normocephalic and atraumatic.  Eyes: Conjunctivae are normal. Pupils are equal, round, and reactive to light.  Neck: Normal range of motion.  Cardiovascular: Normal heart sounds.   Respiratory: Effort normal.  GI: Soft.  Musculoskeletal: Normal range of motion.  Neurological: He is alert.  Skin: Skin is warm and dry.  Psychiatric: His mood appears anxious. His affect is blunt. His speech is delayed. He is slowed and withdrawn. He expresses impulsivity. He expresses suicidal ideation. He expresses no suicidal plans. He exhibits abnormal recent memory and abnormal remote memory.    Review of Systems  Constitutional: Positive for malaise/fatigue.  HENT: Negative.   Eyes: Negative.   Respiratory: Negative.   Cardiovascular: Negative.   Gastrointestinal: Negative.   Musculoskeletal: Negative.   Skin: Negative.   Neurological: Positive for weakness.  Psychiatric/Behavioral: Positive for depression, memory loss and suicidal ideas. Negative for hallucinations and substance abuse. The patient is nervous/anxious and has insomnia.     Blood pressure 126/85, pulse 84, temperature 97.9 F (36.6 C), temperature source Oral, resp. rate 18, height 6' (1.829 m), weight 115.7 kg (255 lb), SpO2 100 %.Body mass index is 34.58 kg/m.  General Appearance: Disheveled  Eye Contact:  Minimal  Speech:  Slow  Volume:  Decreased  Mood:  Dysphoric  Affect:  Blunt  Thought Process:  Goal Directed  Orientation:  Full (Time, Place, and Person)  Thought Content:  Slow. Concrete.  Suicidal Thoughts:  Yes.  without intent/plan  Homicidal Thoughts:  No  Memory:  Immediate;   Fair Recent;   Fair Remote;   Poor  Judgement:  Impaired  Insight:  Shallow  Psychomotor Activity:  Decreased  Concentration:  Concentration: Fair   Recall:  [employer] of Knowledge:  Fair  Language:  Fair  Akathisia:  No  Handed:  Right  AIMS (if indicated):     Assets:  Financial Resources/Insurance Housing Resilience Social Support  ADL's:  Impaired  Cognition:  Impaired,  Mild  Sleep:        Treatment Plan Summary: Daily contact with patient to assess and evaluate symptoms and progress in treatment, Medication management and Plan 55 year old man with chronic intellectual disability and depression. Passive suicidal thoughts. Spoke with his mother. She is not completely against taking him back home but is worried about his mood which does seem to be much worse. I am going to restart him on his mirtazapine 30 mg at night. Case reviewed with emergency room physician. No change to commitment for  now and I am actually going to go ahead and put in orders to admit him to the psychiatry ward. Spoke with TTS and we may have a bed available tonight. If possible we will admit him to the hospital downstairs. If not we will reassess and treated in the emergency room.  Disposition: Recommend psychiatric Inpatient admission when medically cleared. Supportive therapy provided about ongoing stressors.  Alethia Berthold, MD 07/16/2016 6:57 PM

## 2016-07-17 ENCOUNTER — Inpatient Hospital Stay
Admission: EM | Admit: 2016-07-17 | Discharge: 2016-07-29 | DRG: 885 | Disposition: A | Discharge status: Home / Self Care | Payer: Medicare Other | Source: Intra-hospital | Attending: Psychiatry | Admitting: Psychiatry

## 2016-07-17 DIAGNOSIS — M419 Scoliosis, unspecified: Secondary | ICD-10-CM | POA: Diagnosis present

## 2016-07-17 DIAGNOSIS — N186 End stage renal disease: Secondary | ICD-10-CM | POA: Diagnosis present

## 2016-07-17 DIAGNOSIS — I120 Hypertensive chronic kidney disease with stage 5 chronic kidney disease or end stage renal disease: Secondary | ICD-10-CM | POA: Diagnosis present

## 2016-07-17 DIAGNOSIS — Z7982 Long term (current) use of aspirin: Secondary | ICD-10-CM

## 2016-07-17 DIAGNOSIS — K5900 Constipation, unspecified: Secondary | ICD-10-CM | POA: Diagnosis present

## 2016-07-17 DIAGNOSIS — K219 Gastro-esophageal reflux disease without esophagitis: Secondary | ICD-10-CM | POA: Diagnosis present

## 2016-07-17 DIAGNOSIS — D631 Anemia in chronic kidney disease: Secondary | ICD-10-CM | POA: Diagnosis present

## 2016-07-17 DIAGNOSIS — N2581 Secondary hyperparathyroidism of renal origin: Secondary | ICD-10-CM | POA: Diagnosis present

## 2016-07-17 DIAGNOSIS — G2581 Restless legs syndrome: Secondary | ICD-10-CM | POA: Diagnosis present

## 2016-07-17 DIAGNOSIS — F79 Unspecified intellectual disabilities: Secondary | ICD-10-CM

## 2016-07-17 DIAGNOSIS — Z992 Dependence on renal dialysis: Secondary | ICD-10-CM

## 2016-07-17 DIAGNOSIS — F332 Major depressive disorder, recurrent severe without psychotic features: Principal | ICD-10-CM | POA: Diagnosis present

## 2016-07-17 DIAGNOSIS — M5430 Sciatica, unspecified side: Secondary | ICD-10-CM | POA: Diagnosis present

## 2016-07-17 DIAGNOSIS — G8929 Other chronic pain: Secondary | ICD-10-CM | POA: Diagnosis present

## 2016-07-17 DIAGNOSIS — F419 Anxiety disorder, unspecified: Secondary | ICD-10-CM | POA: Diagnosis present

## 2016-07-17 DIAGNOSIS — Z79899 Other long term (current) drug therapy: Secondary | ICD-10-CM

## 2016-07-17 DIAGNOSIS — N189 Chronic kidney disease, unspecified: Secondary | ICD-10-CM | POA: Diagnosis present

## 2016-07-17 DIAGNOSIS — M79606 Pain in leg, unspecified: Secondary | ICD-10-CM | POA: Diagnosis not present

## 2016-07-17 DIAGNOSIS — Z87730 Personal history of (corrected) cleft lip and palate: Secondary | ICD-10-CM | POA: Diagnosis not present

## 2016-07-17 DIAGNOSIS — R45851 Suicidal ideations: Secondary | ICD-10-CM | POA: Diagnosis present

## 2016-07-17 DIAGNOSIS — M545 Low back pain: Secondary | ICD-10-CM | POA: Diagnosis present

## 2016-07-17 DIAGNOSIS — G4700 Insomnia, unspecified: Secondary | ICD-10-CM | POA: Diagnosis present

## 2016-07-17 DIAGNOSIS — Z8249 Family history of ischemic heart disease and other diseases of the circulatory system: Secondary | ICD-10-CM

## 2016-07-17 HISTORY — DX: Chronic kidney disease, unspecified: N18.9

## 2016-07-17 MED ORDER — ALUM & MAG HYDROXIDE-SIMETH 200-200-20 MG/5ML PO SUSP: 30.0000 mL | ORAL | Status: DC | PRN

## 2016-07-17 MED ORDER — ASPIRIN 81 MG PO CHEW
81.0000 mg | CHEWABLE_TABLET | Freq: Every day | ORAL | Status: DC
  Administered 2016-07-17 – 2016-07-29 (×12): 81 mg via ORAL
  Filled 2016-07-17 (×14): qty 1

## 2016-07-17 MED ORDER — TRAZODONE HCL 100 MG PO TABS
100.0000 mg | ORAL_TABLET | Freq: Every day | ORAL | Status: DC
  Administered 2016-07-17: 100 mg via ORAL
  Filled 2016-07-17: qty 1

## 2016-07-17 MED ORDER — METOPROLOL SUCCINATE ER 50 MG PO TB24
50.0000 mg | ORAL_TABLET | Freq: Every day | ORAL | Status: DC
  Administered 2016-07-17 – 2016-07-26 (×9): 50 mg via ORAL
  Filled 2016-07-17 (×11): qty 1

## 2016-07-17 MED ORDER — SENNOSIDES-DOCUSATE SODIUM 8.6-50 MG PO TABS
1.0000 | ORAL_TABLET | Freq: Every day | ORAL | Status: DC
  Administered 2016-07-17 – 2016-07-28 (×13): 1 via ORAL
  Filled 2016-07-17 (×12): qty 1

## 2016-07-17 MED ORDER — CALCIUM ACETATE (PHOS BINDER) 667 MG PO CAPS
1334.0000 mg | ORAL_CAPSULE | Freq: Three times a day (TID) | ORAL | Status: DC
  Administered 2016-07-17 – 2016-07-29 (×28): 1334 mg via ORAL
  Filled 2016-07-17 (×32): qty 2

## 2016-07-17 MED ORDER — ACETAMINOPHEN 325 MG PO TABS
650.0000 mg | ORAL_TABLET | Freq: Four times a day (QID) | ORAL | Status: DC | PRN
  Administered 2016-07-17 – 2016-07-29 (×4): 650 mg via ORAL
  Filled 2016-07-17 (×4): qty 2

## 2016-07-17 MED ORDER — PANTOPRAZOLE SODIUM 40 MG PO TBEC
40.0000 mg | DELAYED_RELEASE_TABLET | Freq: Every day | ORAL | Status: DC
  Administered 2016-07-17 – 2016-07-29 (×13): 40 mg via ORAL
  Filled 2016-07-17 (×14): qty 1

## 2016-07-17 MED ORDER — TRAZODONE HCL 50 MG PO TABS
50.0000 mg | ORAL_TABLET | Freq: Every evening | ORAL | Status: DC | PRN
  Administered 2016-07-17: 50 mg via ORAL
  Filled 2016-07-17: qty 1

## 2016-07-17 MED ORDER — GABAPENTIN 300 MG PO CAPS
300.0000 mg | ORAL_CAPSULE | Freq: Every day | ORAL | Status: DC
  Administered 2016-07-17 – 2016-07-18 (×2): 300 mg via ORAL
  Filled 2016-07-17 (×2): qty 1

## 2016-07-17 MED ORDER — MAGNESIUM HYDROXIDE 400 MG/5ML PO SUSP: 30.0000 mL | Freq: Every day | ORAL | Status: DC | PRN

## 2016-07-17 MED ORDER — MIRTAZAPINE 15 MG PO TABS
30.0000 mg | ORAL_TABLET | Freq: Every day | ORAL | Status: DC
  Administered 2016-07-17 – 2016-07-21 (×5): 30 mg via ORAL
  Filled 2016-07-17 (×6): qty 2

## 2016-07-17 NOTE — Tx Team (Signed)
Initial Treatment Plan 07/17/2016 1:16 AM [name] [id number]    PATIENT STRESSORS: Health problems Occupational concerns   PATIENT STRENGTHS: Communication skills Supportive family/friends   PATIENT IDENTIFIED PROBLEMS: Depression  Suicidal ideation    "Getting my mental status better."               DISCHARGE CRITERIA:  Ability to meet basic life and health needs Improved stabilization in mood, thinking, and/or behavior Need for constant or close observation no longer present Reduction of life-threatening or endangering symptoms to within safe limits Verbal commitment to aftercare and medication compliance  PRELIMINARY DISCHARGE PLAN: Outpatient therapy Return to previous living arrangement  PATIENT/FAMILY INVOLVEMENT: This treatment plan has been presented to and reviewed with the patient, Ronnie Keith, and/or family member.  The patient and family have been given the opportunity to ask questions and make suggestions.  Talbert Forest, RN 07/17/2016, 1:16 AM

## 2016-07-17 NOTE — Progress Notes (Signed)
Patient ID: Ronnie Keith, male   DOB: 01-Dec-1960, 55 y.o.   MRN: [id number]  Pt admitted to unit. Pt is alert and oriented x4. Pt is developmentally delayed and slow to respond. Pt affect is depressed. He states he came to the hospital d/t "just not feeling like I'm worthy. I lost my job, in the last 4 years I lost my dad, my kidneys started acting up. Sometimes I just don't know what to do." Pt reports that he gets dialysis on Tuesdays, Thursdays, and Saturdays. He has an AV fistula on his left upper arm. Pt reports that he has scoliosis and uses a walker to ambulate. Pt gait is unsteady. Pt educated on high fall risk status. Pt c/o bilateral leg pain and requests PRN medication. Pt reports that he lives with his mother and sister. He is unemployed. Pt rates anxiety 5/10 and depression 7/10. Denies SI/HI/AVH at this time. Pt oriented to unit. Urine cup provided to obtain urine sample. q15 minute safety checks maintained. Pt remains free from harm. Will continue to monitor.

## 2016-07-17 NOTE — [facility] Suicide Risk Assessment (Signed)
[place] Admission Suicide Risk Assessment   Nursing information obtained from:  Patient, Review of record Demographic factors:  Male, Caucasian, Unemployed Current Mental Status:  Self-harm thoughts Loss Factors:  Decline in physical health, Decrease in vocational status Historical Factors:  NA Risk Reduction Factors:  Living with another person, especially a relative, Positive social support  Total Time spent with patient: 1 hour Principal Problem: Major depressive disorder, recurrent severe without psychotic features ([place]) Diagnosis:   Patient Active Problem List   Diagnosis Date Noted  . Chronic kidney disease [N18.9] 07/17/2016  . Intellectual disability [F79] 07/16/2016  . Major depressive disorder, recurrent severe without psychotic features ([place]) [F33.2] 09/28/2015  . Hyperkalemia [E87.5] 09/20/2015   Subjective Data: Depression, suicidal ideation.  Continued Clinical Symptoms:  Alcohol Use Disorder Identification Test Final Score (AUDIT): 0 The "Alcohol Use Disorders Identification Test", Guidelines for Use in Primary Care, Second Edition.  World Pharmacologist [facility]). Score between 0-7:  no or low risk or alcohol related problems. Score between 8-15:  moderate risk of alcohol related problems. Score between 16-19:  high risk of alcohol related problems. Score 20 or above:  warrants further diagnostic evaluation for alcohol dependence and treatment.   CLINICAL FACTORS:   Depression:   Hopelessness Impulsivity Medical Diagnoses and Treatments/Surgeries   Musculoskeletal: Strength & Muscle Tone: within normal limits Gait & Station: normal Patient leans: N/A  Psychiatric Specialty Exam: Physical Exam  Nursing note and vitals reviewed.   Review of Systems  Neurological: Positive for weakness.  Psychiatric/Behavioral: Positive for depression and suicidal ideas.  All other systems reviewed and are negative.   Blood pressure (!) 110/57, pulse 72, temperature  97.7 F (36.5 C), temperature source Oral, resp. rate 16, height 6\' 1"  (1.854 m), weight 115.7 kg (255 lb), SpO2 96 %.Body mass index is 33.64 kg/m.  General Appearance: Casual  Eye Contact:  Minimal  Speech:  Slow  Volume:  Decreased  Mood:  Depressed and Hopeless  Affect:  Blunt  Thought Process:  Goal Directed and Descriptions of Associations: Intact  Orientation:  Full (Time, Place, and Person)  Thought Content:  WDL  Suicidal Thoughts:  Yes.  with intent/plan  Homicidal Thoughts:  No  Memory:  Immediate;   Poor Recent;   Poor Remote;   Poor  Judgement:  Impaired  Insight:  Shallow  Psychomotor Activity:  Psychomotor Retardation  Concentration:  Concentration: Fair and Attention Span: Fair  Recall:  Poor  Fund of Knowledge:  Fair  Language:  Poor  Akathisia:  No  Handed:  Right  AIMS (if indicated):     Assets:  Communication Skills Desire for Improvement Financial Resources/Insurance Housing Resilience Social Support  ADL's:  Intact  Cognition:  WNL  Sleep:  Number of Hours: 4      COGNITIVE FEATURES THAT CONTRIBUTE TO RISK:  None    SUICIDE RISK:   Moderate:  Frequent suicidal ideation with limited intensity, and duration, some specificity in terms of plans, no associated intent, good self-control, limited dysphoria/symptomatology, some risk factors present, and identifiable protective factors, including available and accessible social support.   PLAN OF CARE: Hospital admission, medication management, dialysis, discharge planning.  Mr. Furney is a 55 year old male with history of developmental disability, depression, and chronic kidney disease on dialysis admitted for suicidal ideation.  1. Suicidal ideation. The patient is able to contract for safety in the hospital.  2. Mood. We will continue Remeron for depression.  3. Insomnia. He slept 4 hours only. We will offer  Trazodone.   4. GERD. He is on Protonix.  5. Chronic kidney disease. Nephrology  consult is greatly appreciated. The patient will continue dialysis.  6. Hypertension. He is on metoprolol.  7. Disposition. He will be discharged back with his mother. He will follow up with his regular psychiatrist     I certify that inpatient services furnished can reasonably be expected to improve the patient's condition.  Orson Slick, MD 07/17/2016, 12:51 PM

## 2016-07-17 NOTE — H&P (Signed)
Psychiatric Admission Assessment Adult  Patient Identification: Ronnie Keith MRN:  [id number] Date of Evaluation:  07/17/2016 Chief Complaint:  Depression Principal Diagnosis: Major depressive disorder, recurrent severe without psychotic features ([place]) Diagnosis:   Patient Active Problem List   Diagnosis Date Noted  . Chronic kidney disease [N18.9] 07/17/2016  . Intellectual disability [F79] 07/16/2016  . Major depressive disorder, recurrent severe without psychotic features ([place]) [F33.2] 09/28/2015  . Hyperkalemia [E87.5] 09/20/2015   History of Present Illness:   Identifying data. Ronnie Keith is a 55 year old male with history of difficult developmental disability and depression.  Chief complaint. "I feel weak."  History of present illness. Information was obtained from the patient and the chart. The patient has a history of developmental disability and chronic kidney disease. A few months ago he was started on dialysis and h he became increasingly depressed. He was brought to the hospital from his dialysis center where he started voicing suicidal ideation. The patient is not a good historian and unable to describe his symptoms in great detail. He tells me that he is tired of being sick and does not want to press on. He reports very poor sleep, sadness, social isolation, crying spells. He does not have a plan how to commit suicide. His mother is very supportive and always encouraging. A few months ago he was started on Remeron by Dr. Shelbie Ammons while seen in consultation at [facility]. Reportedly it was helpful but he stopped taking Remeron several weeks ago. The patient denies psychotic symptoms or heightened anxiety. There are no drugs or alcohol involved.  Past psychiatric history. Apparently she has never been hospitalized or attempted suicide. He was started on Remeron at that time he dialysis begun. There is mild developmental disability.  Family psychiatric history.  Unknown.  Social history. The patient is disabled he lives with his mother who is very supportive. The patient functions well at baseline. He is able to take care of his needs. He drives a car. He does have decision-making capacity.  Total Time spent with patient: 1 hour  Is the patient at risk to self? Yes.    Has the patient been a risk to self in the past 6 months? No.  Has the patient been a risk to self within the distant past? No.  Is the patient a risk to others? No.  Has the patient been a risk to others in the past 6 months? No.  Has the patient been a risk to others within the distant past? No.   Prior Inpatient Therapy:   Prior Outpatient Therapy:    Alcohol Screening: 1. How often do you have a drink containing alcohol?: Never 2. How many drinks containing alcohol do you have on a typical day when you are drinking?: 1 or 2 3. How often do you have six or more drinks on one occasion?: Never Preliminary Score: 0 4. How often during the last year have you found that you were not able to stop drinking once you had started?: Never 5. How often during the last year have you failed to do what was normally expected from you becasue of drinking?: Never 6. How often during the last year have you needed a first drink in the morning to get yourself going after a heavy drinking session?: Never 7. How often during the last year have you had a feeling of guilt of remorse after drinking?: Never 8. How often during the last year have you been unable to remember what  happened the night before because you had been drinking?: Never 9. Have you or someone else been injured as a result of your drinking?: No 10. Has a relative or friend or a doctor or another health worker been concerned about your drinking or suggested you cut down?: No Alcohol Use Disorder Identification Test Final Score (AUDIT): 0 Brief Intervention: AUDIT score less than 7 or less-screening does not suggest unhealthy  drinking-brief intervention not indicated Substance Abuse History in the last 12 months:  No. Consequences of Substance Abuse: NA Previous Psychotropic Medications: Yes  Psychological Evaluations: No  Past Medical History:  Past Medical History:  Diagnosis Date  . Anxiety   . Chronic kidney disease   . Chronic low back pain   . Hypertension   . Sciatica   . Scoliosis     Past Surgical History:  Procedure Laterality Date  . CLEFT PALATE REPAIR    . EYE SURGERY     "when I was small"  . PERIPHERAL VASCULAR CATHETERIZATION N/A 09/29/2015   Procedure: Dialysis/Perma Catheter Insertion;  Surgeon: Katha Cabal, MD;  Location: [facility];  Service: Cardiovascular;  Laterality: N/A;  . ROTATOR CUFF REPAIR Left    Family History:  Family History  Problem Relation Age of Onset  . Hypertension Other    Tobacco Screening: Have you used any form of tobacco in the last 30 days? (Cigarettes, Smokeless Tobacco, Cigars, and/or Pipes): No Social History:  History  Alcohol Use No     History  Drug Use No    Additional Social History:      Pain Medications: see PTA meds Prescriptions: see PTA meds Over the Counter: see PTA meds History of alcohol / drug use?: No history of alcohol / drug abuse                    Allergies:  No Known Allergies Lab Results:  Results for orders placed or performed during the hospital encounter of 07/16/16 (from the past 48 hour(s))  Comprehensive metabolic panel     Status: Abnormal   Collection Time: 07/16/16  4:05 PM  Result Value Ref Range   Sodium 140 135 - 145 mmol/L   Potassium 4.1 3.5 - 5.1 mmol/L   Chloride 99 (L) 101 - 111 mmol/L   CO2 33 (H) 22 - 32 mmol/L   Glucose, Bld 89 65 - 99 mg/dL   BUN 26 (H) 6 - 20 mg/dL   Creatinine, Ser 5.42 (H) 0.61 - 1.24 mg/dL   Calcium 8.5 (L) 8.9 - 10.3 mg/dL   Total Protein 8.1 6.5 - 8.1 g/dL   Albumin 4.2 3.5 - 5.0 g/dL   AST 19 15 - 41 U/L   ALT 17 17 - 63 U/L   Alkaline  Phosphatase 137 (H) 38 - 126 U/L   Total Bilirubin 0.4 0.3 - 1.2 mg/dL   GFR calc non Af Amer 11 (L) >60 mL/min   GFR calc Af Amer 13 (L) >60 mL/min    Comment: (NOTE) The eGFR has been calculated using the CKD EPI equation. This calculation has not been validated in all clinical situations. eGFR's persistently <60 mL/min signify possible Chronic Kidney Disease.    Anion gap 8 5 - 15  Ethanol     Status: None   Collection Time: 07/16/16  4:05 PM  Result Value Ref Range   Alcohol, Ethyl (B) <5 <5 mg/dL    Comment:        LOWEST DETECTABLE  LIMIT FOR SERUM ALCOHOL IS 5 mg/dL FOR MEDICAL PURPOSES ONLY   Salicylate level     Status: None   Collection Time: 07/16/16  4:05 PM  Result Value Ref Range   Salicylate Lvl <8.2 2.8 - 30.0 mg/dL  Acetaminophen level     Status: Abnormal   Collection Time: 07/16/16  4:05 PM  Result Value Ref Range   Acetaminophen (Tylenol), Serum <10 (L) 10 - 30 ug/mL    Comment:        THERAPEUTIC CONCENTRATIONS VARY SIGNIFICANTLY. A RANGE OF 10-30 ug/mL MAY BE AN EFFECTIVE CONCENTRATION FOR MANY PATIENTS. HOWEVER, SOME ARE BEST TREATED AT CONCENTRATIONS OUTSIDE THIS RANGE. ACETAMINOPHEN CONCENTRATIONS >150 ug/mL AT 4 HOURS AFTER INGESTION AND >50 ug/mL AT 12 HOURS AFTER INGESTION ARE OFTEN ASSOCIATED WITH TOXIC REACTIONS.   cbc     Status: Abnormal   Collection Time: 07/16/16  4:05 PM  Result Value Ref Range   WBC 6.1 3.8 - 10.6 K/uL   RBC 3.71 (L) 4.40 - 5.90 MIL/uL   Hemoglobin 11.9 (L) 13.0 - 18.0 g/dL   HCT 34.4 (L) 40.0 - 52.0 %   MCV 92.7 80.0 - 100.0 fL   MCH 32.0 26.0 - 34.0 pg   MCHC 34.5 32.0 - 36.0 g/dL   RDW 15.1 (H) 11.5 - 14.5 %   Platelets 129 (L) 150 - 440 K/uL  Lipase, blood     Status: None   Collection Time: 07/16/16  4:05 PM  Result Value Ref Range   Lipase 44 11 - 51 U/L  Urine Drug Screen, Qualitative     Status: None   Collection Time: 07/16/16  4:15 PM  Result Value Ref Range   Tricyclic, Ur Screen NONE DETECTED  NONE DETECTED   Amphetamines, Ur Screen NONE DETECTED NONE DETECTED   MDMA (Ecstasy)Ur Screen NONE DETECTED NONE DETECTED   Cocaine Metabolite,Ur [place] NONE DETECTED NONE DETECTED   Opiate, Ur Screen NONE DETECTED NONE DETECTED   Phencyclidine (PCP) Ur S NONE DETECTED NONE DETECTED   Cannabinoid 50 Ng, Ur [place] NONE DETECTED NONE DETECTED   Barbiturates, Ur Screen NONE DETECTED NONE DETECTED   Benzodiazepine, Ur Scrn NONE DETECTED NONE DETECTED   Methadone Scn, Ur NONE DETECTED NONE DETECTED    Comment: (NOTE) 423  Tricyclics, urine               Cutoff 1000 ng/mL 200  Amphetamines, urine             Cutoff 1000 ng/mL 300  MDMA (Ecstasy), urine           Cutoff 500 ng/mL 400  Cocaine Metabolite, urine       Cutoff 300 ng/mL 500  Opiate, urine                   Cutoff 300 ng/mL 600  Phencyclidine (PCP), urine      Cutoff 25 ng/mL 700  Cannabinoid, urine              Cutoff 50 ng/mL 800  Barbiturates, urine             Cutoff 200 ng/mL 900  Benzodiazepine, urine           Cutoff 200 ng/mL 1000 Methadone, urine                Cutoff 300 ng/mL 1100 1200 The urine drug screen provides only a preliminary, unconfirmed 1300 analytical test result and should not be used for non-medical 1400 purposes.  Clinical consideration and professional judgment should 1500 be applied to any positive drug screen result due to possible 1600 interfering substances. A more specific alternate chemical method 1700 must be used in order to obtain a confirmed analytical result.  1800 Gas chromato graphy / mass spectrometry (GC/MS) is the preferred 1900 confirmatory method.   Urinalysis complete, with microscopic     Status: Abnormal   Collection Time: 07/16/16  4:15 PM  Result Value Ref Range   Color, Urine YELLOW (A) YELLOW   APPearance CLEAR (A) CLEAR   Glucose, UA 50 (A) NEGATIVE mg/dL   Bilirubin Urine NEGATIVE NEGATIVE   Ketones, ur NEGATIVE NEGATIVE mg/dL   Specific Gravity, Urine 1.012 1.005 - 1.030    Hgb urine dipstick NEGATIVE NEGATIVE   pH 7.0 5.0 - 8.0   Protein, ur 100 (A) NEGATIVE mg/dL   Nitrite NEGATIVE NEGATIVE   Leukocytes, UA NEGATIVE NEGATIVE   RBC / HPF 0-5 0 - 5 RBC/hpf   WBC, UA 0-5 0 - 5 WBC/hpf   Bacteria, UA NONE SEEN NONE SEEN   Squamous Epithelial / LPF NONE SEEN NONE SEEN    Blood Alcohol level:  Lab Results  Component Value Date   ETH <5 02/63/7858    Metabolic Disorder Labs:  No results found for: HGBA1C, MPG No results found for: PROLACTIN No results found for: CHOL, TRIG, HDL, CHOLHDL, VLDL, LDLCALC  Current Medications: Current Facility-Administered Medications  Medication Dose Route Frequency Provider Last Rate Last Dose  . acetaminophen (TYLENOL) tablet 650 mg  650 mg Oral Q6H PRN Gonzella Lex, MD   650 mg at 07/17/16 0048  . alum & mag hydroxide-simeth (MAALOX/MYLANTA) 200-200-20 MG/5ML suspension 30 mL  30 mL Oral Q4H PRN Gonzella Lex, MD      . aspirin chewable tablet 81 mg  81 mg Oral Daily Bishoy Cupp B Aleaya Latona, MD      . gabapentin (NEURONTIN) capsule 300 mg  300 mg Oral QHS Miciah Shealy B Kataya Guimont, MD      . magnesium hydroxide (MILK OF MAGNESIA) suspension 30 mL  30 mL Oral Daily PRN Gonzella Lex, MD      . metoprolol succinate (TOPROL-XL) 24 hr tablet 50 mg  50 mg Oral Daily Gonzella Lex, MD   50 mg at 07/17/16 0913  . mirtazapine (REMERON) tablet 30 mg  30 mg Oral QHS Gonzella Lex, MD      . pantoprazole (PROTONIX) EC tablet 40 mg  40 mg Oral Daily Gonzella Lex, MD   40 mg at 07/17/16 0914  . senna-docusate (Senokot-S) tablet 1 tablet  1 tablet Oral QHS Cambreigh Dearing B Annice Jolly, MD      . traZODone (DESYREL) tablet 100 mg  100 mg Oral QHS Unknown Schleyer B Shalaine Payson, MD       PTA Medications: Prescriptions Prior to Admission  Medication Sig Dispense Refill Last Dose  . acetaminophen (TYLENOL) 325 MG tablet Take 650 mg by mouth every 6 (six) hours as needed for mild pain or moderate pain.   Past Week at Unknown time  . aspirin EC 81 MG  tablet Take 81 mg by mouth daily.   07/16/2016 at Unknown time  . fluticasone (FLONASE) 50 MCG/ACT nasal spray Place 1 spray into both nostrils daily.   07/16/2016 at Unknown time  . gabapentin (NEURONTIN) 300 MG capsule Take 1 capsule (300 mg total) by mouth 2 (two) times daily. 60 capsule 2 07/16/2016 at Unknown time  . MELATONIN PO Take 1 tablet by  mouth daily.   07/16/2016 at Unknown time  . metoprolol succinate (TOPROL-XL) 50 MG 24 hr tablet Take 50 mg by mouth daily.   07/16/2016 at Unknown time  . mirtazapine (REMERON) 30 MG tablet Take 1 tablet (30 mg total) by mouth at bedtime. 30 tablet 2 07/16/2016 at Unknown time  . omeprazole (PRILOSEC) 40 MG capsule Take 40 mg by mouth daily.   07/16/2016 at Unknown time  . oxyCODONE (OXY IR/ROXICODONE) 5 MG immediate release tablet Take 1 tablet (5 mg total) by mouth every 6 (six) hours as needed for moderate pain. 20 tablet 0 Past Week at Unknown time  . senna-docusate (SENOKOT-S) 8.6-50 MG tablet Take 2 tablets by mouth 2 (two) times daily. 60 tablet 2 Past Month at Unknown time  . ALPRAZolam (XANAX) 1 MG tablet Take 1 tablet (1 mg total) by mouth 2 (two) times daily as needed for anxiety. (Patient not taking: Reported on 07/17/2016) 20 tablet 0 Not Taking at Unknown time    Musculoskeletal: Strength & Muscle Tone: within normal limits Gait & Station: normal Patient leans: N/A  Psychiatric Specialty Exam: I reviewed physical exam performed in the emergency room and neck with the findings. Physical Exam  Nursing note and vitals reviewed.   Review of Systems  Psychiatric/Behavioral: Positive for depression and suicidal ideas. The patient has insomnia.   All other systems reviewed and are negative.   Blood pressure (!) 110/57, pulse 72, temperature 97.7 F (36.5 C), temperature source Oral, resp. rate 16, height _0  (1.854 m), weight 115.7 kg (255 lb), SpO2 96 %.Body mass index is 33.64 kg/m.  See SRA.                                                   Sleep:  Number of Hours: 4    Treatment Plan Summary: Daily contact with patient to assess and evaluate symptoms and progress in treatment and Medication management   Mr. Doyon is a 55 year old male with history of developmental disability, depression, and chronic kidney disease on dialysis admitted for suicidal ideation.  1. Suicidal ideation. The patient is able to contract for safety in the hospital.  2. Mood. We will continue Remeron for depression.  3. Insomnia. He slept 4 hours only. We will offer Trazodone.   4. GERD. He is on Protonix.  5. Chronic kidney disease. Nephrology consult is greatly appreciated. The patient will continue dialysis.  6. Hypertension. He is on metoprolol.  7. Disposition. He will be discharged back with his mother. He will follow up with his regular psychiatrist   Observation Level/Precautions:  15 minute checks  Laboratory:  CBC Chemistry Profile UDS UA  Psychotherapy:    Medications:    Consultations:    Discharge Concerns:    Estimated LOS:  Other:     Physician Treatment Plan for Primary Diagnosis: Major depressive disorder, recurrent severe without psychotic features ([place]) Long Term Goal(s): Improvement in symptoms so as ready for discharge  Short Term Goals: Ability to identify changes in lifestyle to reduce recurrence of condition will improve, Ability to verbalize feelings will improve, Ability to disclose and discuss suicidal ideas, Ability to demonstrate self-control will improve, Ability to identify and develop effective coping behaviors will improve, Ability to maintain clinical measurements within normal limits will improve and Compliance with prescribed medications will improve  Physician Treatment  Plan for Secondary Diagnosis: Principal Problem:   Major depressive disorder, recurrent severe without psychotic features ([place]) Active Problems:   Chronic kidney disease  Long Term Goal(s):  NA  Short Term Goals: NA  I certify that inpatient services furnished can reasonably be expected to improve the patient's condition.    Orson Slick, MD 11/15/201712:58 PM

## 2016-07-17 NOTE — Progress Notes (Signed)
NUTRITION ASSESSMENT  Pt identified as at risk on the Malnutrition Screen Tool  INTERVENTION: 1. Educated patient on the importance of nutrition and encouraged intake of food and beverages. 2. Discussed weight goals. 3. Supplements:None indicated  NUTRITION DIAGNOSIS: Unintentional weight loss related to sub-optimal intake as evidenced by pt report.   Goal: Pt to meet >/= 90% of their estimated nutrition needs.  Monitor:  PO intake  Assessment:  Cortlan Dolin is a  55 y.o. male poor historian who denies weight loss or poor appetite. He presents with suicidial ideations, history of developmental delay and ESRD on HD. Per chart, his weight has been relatively stable PO intake 0-100% thus far, skipped lunch today. Monitor PO intake, need for Nepro but doesn't appear so thus far. Well nourished upon observation.  Height: Ht Readings from Last 1 Encounters:  07/17/16 6\' 1"  (1.854 m)    Weight: Wt Readings from Last 1 Encounters:  07/17/16 255 lb (115.7 kg)    Weight Hx: Wt Readings from Last 10 Encounters:  07/17/16 255 lb (115.7 kg)  07/16/16 255 lb (115.7 kg)  07/11/16 260 lb (117.9 kg)  05/21/16 250 lb (113.4 kg)  01/04/16 240 lb 4.8 oz (109 kg)  10/04/15 239 lb 13.8 oz (108.8 kg)    BMI:  Body mass index is 33.64 kg/m. Pt meets criteria for obese class I based on current BMI.  Estimated Nutritional Needs: Kcal: 25-30 kcal/kg Protein: > 1 gram protein/kg Fluid: 1 ml/kcal  Diet Order: Diet renal 60/70-10-04-1198 Fluid restriction: 1500 mL Fluid; Room service appropriate? Yes; Fluid consistency: Thin Pt is also offered choice of unit snacks mid-morning and mid-afternoon.  Pt is eating as desired.   Lab results and medications reviewed.   Satira Anis. Acheron Sugg, MS, RD LDN Inpatient Clinical Dietitian Pager [phone]

## 2016-07-17 NOTE — Progress Notes (Signed)
Recreation Therapy Notes  Date: 11.15.17 Time: 9:30 am Location: Craft Room  Group Topic: Self-esteem  Goal Area(s) Addresses:  Patient will write at least one positive trait about self. Patient will write at least one healthy coping skill.  Behavioral Response: Attentive  Intervention: All About Me  Activity: Patients were instructed to make a pamphlet including their life's motto, positive traits, healthy coping skills, and their support system.  Education: LRT educated patients on ways they can increase their self-esteem.  Education Outcome: In group clarification offered   Clinical Observations/Feedback: Patient completed activity by writing his life's motto, positive traits, healthy coping skills, and his support system.  Leonette Monarch, LRT/CTRS 07/17/2016 10:28 AM

## 2016-07-17 NOTE — [facility] Group Notes (Signed)
[place] LCSW Group Therapy  07/17/2016 1:54 PM  Type of Therapy:  Group Therapy  Participation Level:  Patient did not attend group. CSW invited patient to group.   Summary of Progress/Problems:Emotional Regulation: Patients will identify both negative and positive emotions. They will discuss emotions they have difficulty regulating and how they impact their lives. Patients will be asked to identify healthy coping skills to combat unhealthy reactions to negative emotions.    Kerrin Markman G. [place], [place] 07/17/2016, 1:55 PM

## 2016-07-17 NOTE — Progress Notes (Signed)
patient affect is flat. When asked how he is feeling states "I am just here"  Denies SI at this time.  Verbalizes that he has general pain and rates it as a 6. Offered pain medications but patient declined.  Uses walker for ambulation.  Moves slowly.  Up to dayroom with peers watching TV.  Support and encouragement offered.  Safety maintained.

## 2016-07-17 NOTE — Progress Notes (Signed)
[facility]  ROUNDING NOTE   Subjective:   Ronnie Keith admitted to [facility] for Depression   Patient states his dialysis treatments have been going well. He has not complaints.   Objective:  Vital signs in last 24 hours:  Temp:  [97.7 F (36.5 C)-98.2 F (36.8 C)] 97.7 F (36.5 C) (11/15 0716) Pulse Rate:  [72-78] 72 (11/15 0716) Resp:  [16] 16 (11/15 0716) BP: (110-130)/(57-84) 110/57 (11/15 0716) SpO2:  [96 %] 96 % (11/15 0015) Weight:  [115.7 kg (255 lb)] 115.7 kg (255 lb) (11/15 0015)  Weight change:  Filed Weights   07/17/16 0015  Weight: 115.7 kg (255 lb)    Intake/Output: No intake/output data recorded.   Intake/Output this shift:  Total I/O In: 240 [P.O.:240] Out: -   Physical Exam: General: NAD,   Head: Normocephalic, atraumatic. Moist oral mucosal membranes  Eyes: Anicteric, PERRL  Neck: Supple, trachea midline  Lungs:  Clear to auscultation  Heart: Regular rate and rhythm  Abdomen:  Soft, nontender,   Extremities:  1+ peripheral edema.  Neurologic: Nonfocal, moving all four extremities  Skin: No lesions  Access: Left arm AVF    Basic Metabolic Panel:  Recent Labs Lab 07/16/16 1605  NA 140  K 4.1  CL 99*  CO2 33*  GLUCOSE 89  BUN 26*  CREATININE 5.42*  CALCIUM 8.5*    Liver Function Tests:  Recent Labs Lab 07/16/16 1605  AST 19  ALT 17  ALKPHOS 137*  BILITOT 0.4  PROT 8.1  ALBUMIN 4.2    Recent Labs Lab 07/16/16 1605  LIPASE 44   No results for input(s): AMMONIA in the last 168 hours.  CBC:  Recent Labs Lab 07/16/16 1605  WBC 6.1  HGB 11.9*  HCT 34.4*  MCV 92.7  PLT 129*    Cardiac Enzymes: No results for input(s): CKTOTAL, CKMB, CKMBINDEX, TROPONINI in the last 168 hours.  BNP: Invalid input(s): POCBNP  CBG: No results for input(s): GLUCAP in the last 168 hours.  Microbiology: Results for orders placed or performed during the hospital encounter of 09/19/15  CULTURE, BLOOD  (ROUTINE X 2) w Reflex to PCR ID Panel     Status: None   Collection Time: 09/25/15 11:08 AM  Result Value Ref Range Status   Specimen Description BLOOD LEFT HAND  Final   Special Requests BOTTLES DRAWN AEROBIC AND ANAEROBIC [place]  Final   Culture NO GROWTH 5 DAYS  Final   Report Status 09/30/2015 FINAL  Final  CULTURE, BLOOD (ROUTINE X 2) w Reflex to PCR ID Panel     Status: None   Collection Time: 09/25/15  3:41 PM  Result Value Ref Range Status   Specimen Description BLOOD LINE  Final   Special Requests   Final    BOTTLES DRAWN AEROBIC AND ANAEROBIC AERO 8CC ANA 14CC   Culture NO GROWTH 5 DAYS  Final   Report Status 09/30/2015 FINAL  Final  Wound culture     Status: None   Collection Time: 09/26/15  3:02 PM  Result Value Ref Range Status   Specimen Description WOUND  Final   Special Requests Normal  Final   Gram Stain RARE WBC SEEN NO ORGANISMS SEEN   Final   Culture LIGHT GROWTH STAPHYLOCOCCUS AUREUS  Final   Report Status 09/29/2015 FINAL  Final   Organism ID, Bacteria STAPHYLOCOCCUS AUREUS  Final      Susceptibility   Staphylococcus aureus - MIC*    OXACILLIN  Value in next row Sensitive      SENSITIVE<=0.25    GENTAMICIN Value in next row Sensitive      SENSITIVE<=0.5    CIPROFLOXACIN Value in next row Sensitive      SENSITIVE<=0.5    ERYTHROMYCIN Value in next row Sensitive      SENSITIVE<=0.25    CLINDAMYCIN Value in next row Sensitive      SENSITIVE<=0.25    VANCOMYCIN Value in next row Sensitive      SENSITIVE1    TETRACYCLINE Value in next row Sensitive      SENSITIVE<=1    RIFAMPIN Value in next row Sensitive      SENSITIVE<=0.5    TRIMETH/SULFA Value in next row Sensitive      SENSITIVE<=10    * LIGHT GROWTH STAPHYLOCOCCUS AUREUS    Coagulation Studies: No results for input(s): LABPROT, INR in the last 72 hours.  Urinalysis:  Recent Labs  07/16/16 1615  COLORURINE YELLOW*  LABSPEC 1.012  PHURINE 7.0  GLUCOSEU 50*  HGBUR NEGATIVE   BILIRUBINUR NEGATIVE  KETONESUR NEGATIVE  PROTEINUR 100*  NITRITE NEGATIVE  LEUKOCYTESUR NEGATIVE      Imaging: Ct Abdomen Pelvis Wo Contrast  Result Date: 07/16/2016 CLINICAL DATA:  Cough, generalized weakness and abdominal pain. Suicidal ideation. End-stage renal disease on dialysis. EXAM: CT ABDOMEN AND PELVIS WITHOUT CONTRAST TECHNIQUE: Multidetector CT imaging of the abdomen and pelvis was performed following the standard protocol without IV contrast. COMPARISON:  CT abdomen and pelvis September 21, 2015. FINDINGS: LOWER CHEST: 5 mm RIGHT lower lobe ground-glass nodule, below size followup recommendation. Heart size is normal. No pericardial effusions. The visualized heart size is normal. No pericardial effusion. HEPATOBILIARY: Normal. PANCREAS: Normal. SPLEEN: Normal. ADRENALS/URINARY TRACT: Kidneys are orthotopic, stable symmetric atrophy. Multiple bilateral renal cysts measure up to 16 mm LEFT lower pole. No nephrolithiasis, hydronephrosis; limited assessment for renal masses on this nonenhanced examination. The unopacified ureters are normal in course and caliber. Urinary bladder is partially distended and unremarkable. Normal adrenal glands. STOMACH/BOWEL: The stomach, small and large bowel are normal in course and caliber without inflammatory changes. VASCULAR/LYMPHATIC: Aortoiliac vessels are normal in course and caliber. No lymphadenopathy by CT size criteria. REPRODUCTIVE: Dystrophic prostatic calcifications, prostate is not enlarged. OTHER: No intraperitoneal free fluid or free air. MUSCULOSKELETAL: Non-acute. Small fat containing LEFT inguinal hernia. Small fat containing inguinal hernia. Multiple small soft tissue nodules anterior abdominal wall most consistent with injection granulomas. Broad lumbar dextroscoliosis. Moderate to severe degenerative changes lumbar spine. Scattered Schmorl's nodes. Severe LEFT L3-4 and L4-5 neural foraminal narrowing. Severe RIGHT greater than LEFT L5-S1  neural foraminal narrowing. 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 IMPRESSION: No acute intra-abdominal or pelvic process. Atrophic kidneys without urolithiasis or obstructive uropathy. Multilevel severe neural foraminal narrowing. Electronically Signed   By: Elon Alas M.D.   On: 07/16/2016 20:00   Dg Chest 2 View  Result Date: 07/16/2016 CLINICAL DATA:  Cough for several days. Weakness. Abdominal pain. On dialysis. EXAM: CHEST  2 VIEW COMPARISON:  07/11/2016 FINDINGS: Low lung volumes noted. Both lungs appear clear. Heart size is stable. Lordotic positioning noted. IMPRESSION: Stable low lung volumes.  No active lung disease. Electronically Signed   By: Earle Gell M.D.   On: 07/16/2016 17:34     Medications:    . aspirin  81 mg Oral Daily  . gabapentin  300 mg Oral QHS  . metoprolol succinate  50 mg Oral Daily  . mirtazapine  30 mg  Oral QHS  . pantoprazole  40 mg Oral Daily  . senna-docusate  1 tablet Oral QHS  . traZODone  100 mg Oral QHS   acetaminophen, alum & mag hydroxide-simeth, magnesium hydroxide  Assessment/ Plan:  Ronnie Keith is a 55 y.o. white male with past medical history of developmental deficit, hypertension, chronic low back pain, scoliosis, cardiac ablation for PSVT, ESRD February 2017.   TTS [facility] [place]  1. End Stage Renal Disease: AVF Schedule dialysis for tomorrow and keep TTS schedule.   2. Hypertension: Blood pressure at goal.  - metoprolol - get midodrine before dialysis treatments  3. Anemia of chronic kidney disease: hemoglobin at goal, 11.9 - Mircera as outpatient. No indication for epo  4. Secondary Hyperparathyroidism: outpatient PTH elevated 774. On 07/02/16. Calcium and phosphorus at goal.  - restart calcium acetate with meals.    LOS: [place], [place] 11/15/20174:37 PM

## 2016-07-17 NOTE — Progress Notes (Signed)
Patient ID: Ronnie Keith, male   DOB: 21-Apr-1961, 55 y.o.   MRN: [id number] Skin Assessment and Contrabands Check Completed with Ms Rich Fuchs, RN. [place] Skin Intact except for Scoliosis; right under breast skin tags (3) and left upper arm Fistula for Dialysis T/Th/Sat: dressing reinforced to the site. No tattoos or open wounds found. No contrabands found on the patient and his belongings.

## 2016-07-18 LAB — CBC
HCT: 33 % — ABNORMAL LOW (ref 40.0–52.0)
Hemoglobin: 11.2 g/dL — ABNORMAL LOW (ref 13.0–18.0)
MCH: 31.4 pg (ref 26.0–34.0)
MCHC: 33.9 g/dL (ref 32.0–36.0)
MCV: 92.6 fL (ref 80.0–100.0)
PLATELETS: 105 10*3/uL — AB (ref 150–440)
RBC: 3.56 MIL/uL — AB (ref 4.40–5.90)
RDW: 15.2 % — AB (ref 11.5–14.5)
WBC: 4.5 10*3/uL (ref 3.8–10.6)

## 2016-07-18 LAB — HEMOGLOBIN A1C
HEMOGLOBIN A1C: 5.2 % (ref 4.8–5.6)
MEAN PLASMA GLUCOSE: 103 mg/dL

## 2016-07-18 MED ORDER — LIDOCAINE-PRILOCAINE 2.5-2.5 % EX CREA
TOPICAL_CREAM | Freq: Once | CUTANEOUS | Status: DC
  Filled 2016-07-18: qty 5

## 2016-07-18 MED ORDER — TEMAZEPAM 15 MG PO CAPS
15.0000 mg | ORAL_CAPSULE | Freq: Every day | ORAL | Status: DC
  Administered 2016-07-18: 15 mg via ORAL
  Filled 2016-07-18: qty 1

## 2016-07-18 NOTE — Progress Notes (Signed)
Dialysis completed without issue. Patient tolerated well. Goal met. Report called to Floyde Parkins RN

## 2016-07-18 NOTE — [facility] Group Notes (Signed)
[facility] Group Notes:  (Nursing/MHT/Case Management/Adjunct)  Date:  07/18/2016  Time:  5:28 AM  Type of Therapy:  Psychoeducational Skills  Participation Level:  Active  Participation Quality:  Appropriate  Affect:  Appropriate  Cognitive:  Appropriate  Insight:  Appropriate  Engagement in Group:  Engaged  Modes of Intervention:  Discussion, Socialization and Support  Summary of Progress/Problems:  Reece Agar 07/18/2016, 5:28 AM

## 2016-07-18 NOTE — Tx Team (Signed)
Interdisciplinary Treatment and Diagnostic Plan Update  07/18/2016 Time of Session: 10:30am Ronnie Keith MRN: [id number]  Principal Diagnosis: Major depressive disorder, recurrent severe without psychotic features ([place])  Secondary Diagnoses: Principal Problem:   Major depressive disorder, recurrent severe without psychotic features ([place]) Active Problems:   Chronic kidney disease   Current Medications:  Current Facility-Administered Medications  Medication Dose Route Frequency Provider Last Rate Last Dose  . acetaminophen (TYLENOL) tablet 650 mg  650 mg Oral Q6H PRN Gonzella Lex, MD   650 mg at 07/17/16 0048  . alum & mag hydroxide-simeth (MAALOX/MYLANTA) 200-200-20 MG/5ML suspension 30 mL  30 mL Oral Q4H PRN Gonzella Lex, MD      . aspirin chewable tablet 81 mg  81 mg Oral Daily Jolanta B Pucilowska, MD   81 mg at 07/18/16 0836  . calcium acetate (PHOSLO) capsule 1,334 mg  1,334 mg Oral TID WC Lavonia Dana, MD   1,334 mg at 07/18/16 0836  . gabapentin (NEURONTIN) capsule 300 mg  300 mg Oral QHS Clovis Fredrickson, MD   300 mg at 07/17/16 2127  . lidocaine-prilocaine (EMLA) cream   Topical Once Lavonia Dana, MD      . magnesium hydroxide (MILK OF MAGNESIA) suspension 30 mL  30 mL Oral Daily PRN Gonzella Lex, MD      . metoprolol succinate (TOPROL-XL) 24 hr tablet 50 mg  50 mg Oral Daily Gonzella Lex, MD   50 mg at 07/18/16 0836  . mirtazapine (REMERON) tablet 30 mg  30 mg Oral QHS Gonzella Lex, MD   30 mg at 07/17/16 2128  . pantoprazole (PROTONIX) EC tablet 40 mg  40 mg Oral Daily Gonzella Lex, MD   40 mg at 07/18/16 0836  . senna-docusate (Senokot-S) tablet 1 tablet  1 tablet Oral QHS Clovis Fredrickson, MD   1 tablet at 07/17/16 2127  . traZODone (DESYREL) tablet 100 mg  100 mg Oral QHS Clovis Fredrickson, MD   100 mg at 07/17/16 2128   PTA Medications: Prescriptions Prior to Admission  Medication Sig Dispense Refill Last Dose  . acetaminophen (TYLENOL) 325 MG  tablet Take 650 mg by mouth every 6 (six) hours as needed for mild pain or moderate pain.   Past Week at Unknown time  . aspirin EC 81 MG tablet Take 81 mg by mouth daily.   07/16/2016 at Unknown time  . fluticasone (FLONASE) 50 MCG/ACT nasal spray Place 1 spray into both nostrils daily.   07/16/2016 at Unknown time  . gabapentin (NEURONTIN) 300 MG capsule Take 1 capsule (300 mg total) by mouth 2 (two) times daily. 60 capsule 2 07/16/2016 at Unknown time  . MELATONIN PO Take 1 tablet by mouth daily.   07/16/2016 at Unknown time  . metoprolol succinate (TOPROL-XL) 50 MG 24 hr tablet Take 50 mg by mouth daily.   07/16/2016 at Unknown time  . mirtazapine (REMERON) 30 MG tablet Take 1 tablet (30 mg total) by mouth at bedtime. 30 tablet 2 07/16/2016 at Unknown time  . omeprazole (PRILOSEC) 40 MG capsule Take 40 mg by mouth daily.   07/16/2016 at Unknown time  . oxyCODONE (OXY IR/ROXICODONE) 5 MG immediate release tablet Take 1 tablet (5 mg total) by mouth every 6 (six) hours as needed for moderate pain. 20 tablet 0 Past Week at Unknown time  . senna-docusate (SENOKOT-S) 8.6-50 MG tablet Take 2 tablets by mouth 2 (two) times daily. 60 tablet 2 Past Month at  Unknown time  . ALPRAZolam (XANAX) 1 MG tablet Take 1 tablet (1 mg total) by mouth 2 (two) times daily as needed for anxiety. (Patient not taking: Reported on 07/17/2016) 20 tablet 0 Not Taking at Unknown time    Patient Stressors: Health problems Occupational concerns  Patient Strengths: Armed forces logistics/support/administrative officer Supportive family/friends  Treatment Modalities: Medication Management, Group therapy, Case management,  1 to 1 session with clinician, Psychoeducation, Recreational therapy.   Physician Treatment Plan for Primary Diagnosis: Major depressive disorder, recurrent severe without psychotic features ([place]) Long Term Goal(s): Improvement in symptoms so as ready for discharge NA   Short Term Goals: Ability to identify changes in lifestyle to  reduce recurrence of condition will improve Ability to verbalize feelings will improve Ability to disclose and discuss suicidal ideas Ability to demonstrate self-control will improve Ability to identify and develop effective coping behaviors will improve Ability to maintain clinical measurements within normal limits will improve Compliance with prescribed medications will improve NA  Medication Management: Evaluate patient's response, side effects, and tolerance of medication regimen.  Therapeutic Interventions: 1 to 1 sessions, Unit Group sessions and Medication administration.  Evaluation of Outcomes: Progressing  Physician Treatment Plan for Secondary Diagnosis: Principal Problem:   Major depressive disorder, recurrent severe without psychotic features ([place]) Active Problems:   Chronic kidney disease  Long Term Goal(s): Improvement in symptoms so as ready for discharge NA   Short Term Goals: Ability to identify changes in lifestyle to reduce recurrence of condition will improve Ability to verbalize feelings will improve Ability to disclose and discuss suicidal ideas Ability to demonstrate self-control will improve Ability to identify and develop effective coping behaviors will improve Ability to maintain clinical measurements within normal limits will improve Compliance with prescribed medications will improve NA     Medication Management: Evaluate patient's response, side effects, and tolerance of medication regimen.  Therapeutic Interventions: 1 to 1 sessions, Unit Group sessions and Medication administration.  Evaluation of Outcomes: Progressing   RN Treatment Plan for Primary Diagnosis: Major depressive disorder, recurrent severe without psychotic features ([place]) Long Term Goal(s): Knowledge of disease and therapeutic regimen to maintain health will improve  Short Term Goals: Ability to remain free from injury will improve, Ability to verbalize frustration and anger  appropriately will improve, Ability to participate in decision making will improve, Ability to verbalize feelings will improve and Ability to identify and develop effective coping behaviors will improve  Medication Management: RN will administer medications as ordered by provider, will assess and evaluate patient's response and provide education to patient for prescribed medication. RN will report any adverse and/or side effects to prescribing provider.  Therapeutic Interventions: 1 on 1 counseling sessions, Psychoeducation, Medication administration, Evaluate responses to treatment, Monitor vital signs and CBGs as ordered, Perform/monitor CIWA, COWS, AIMS and Fall Risk screenings as ordered, Perform wound care treatments as ordered.  Evaluation of Outcomes: Progressing   LCSW Treatment Plan for Primary Diagnosis: Major depressive disorder, recurrent severe without psychotic features ([place]) Long Term Goal(s): Safe transition to appropriate next level of care at discharge, Engage patient in therapeutic group addressing interpersonal concerns.  Short Term Goals: Engage patient in aftercare planning with referrals and resources, Increase social support, Increase ability to appropriately verbalize feelings, Increase emotional regulation and Facilitate acceptance of mental health diagnosis and concerns  Therapeutic Interventions: Assess for all discharge needs, 1 to 1 time with Social worker, Explore available resources and support systems, Assess for adequacy in community support network, Educate family and significant other(s) on  suicide prevention, Complete Psychosocial Assessment, Interpersonal group therapy.  Evaluation of Outcomes: Progressing   Progress in Treatment: Attending groups: No. CSW still assessing, pt new to milieu Participating in groups: No. CSW still assessing, pt new to milieu Taking medication as prescribed: No. Toleration medication: No. Family/Significant other contact  made: No, will contact:  CSW assessing for appropriate contacts   Patient understands diagnosis: Yes. Discussing patient identified problems/goals with staff: Yes. Medical problems stabilized or resolved: Yes. Denies suicidal/homicidal ideation: Yes. Issues/concerns per patient self-inventory: No. Other: none reported   New problem(s) identified: No, Describe:  none reported  New Short Term/Long Term Goal(s): None reported  Discharge Plan or Barriers: CSW still assessing for appropriate referrals  Reason for Continuation of Hospitalization: Anxiety Depression  Estimated Length of Stay: 3-5 days  Attendees: Patient:  07/18/2016 10:56 AM  Physician: Dr. Bary Leriche, MD 07/18/2016 10:56 AM  Nursing: Polly Cobia, RN 07/18/2016 10:56 AM  RN Care Manager: 07/18/2016 10:56 AM  Social Worker: Alphonse Guild. Daine Gravel, LCAS 07/18/2016 10:56 AM  Recreational Therapist: Everitt Amber, LRT 07/18/2016 10:56 AM  Social Worker:  07/18/2016 10:56 AM  Other:  07/18/2016 10:56 AM  Other: 07/18/2016 10:56 AM    Scribe for Treatment Team: Alphonse Guild Karel Turpen, LCSWA 07/18/2016

## 2016-07-18 NOTE — Care Management (Signed)
Ronnie Keith was still sleeping this morning when I went to check in on him. I will give him more time to rest and look to follow up after breakfast, possibly late morning or early afternoon.

## 2016-07-18 NOTE — Progress Notes (Signed)
Observed in the day room watching TV with others, receptive, cognizant of up coming HD in the morning, A&Ox2, re-oriented to time and date, ambulates with a front wheel rolling walker; denied pain, denied SI/H,denied AV/H, patient said "just okay, I am just okay.." Polite, pleasant, cooperative, med compliant.

## 2016-07-18 NOTE — Progress Notes (Signed)
[facility] MD Progress Note  07/18/2016 1:24 PM Ronnie Keith  MRN:  [id number]  Subjective:  Ronnie Keith continues to complain of depressive symptoms, low energy, fatigue, and malaise. He is not so certain about his suicidal ideation. This morning he was out of his room. He was unable to meet with treatment team this morning that he was in dialysis.   Principal Problem: Major depressive disorder, recurrent severe without psychotic features ([place]) Diagnosis:   Patient Active Problem List   Diagnosis Date Noted  . Chronic kidney disease [N18.9] 07/17/2016  . Intellectual disability [F79] 07/16/2016  . Major depressive disorder, recurrent severe without psychotic features ([place]) [F33.2] 09/28/2015  . Hyperkalemia [E87.5] 09/20/2015   Total Time spent with patient: 20 minutes  Past Psychiatric History: Depression.  Past Medical History:  Past Medical History:  Diagnosis Date  . Anxiety   . Chronic kidney disease   . Chronic low back pain   . Hypertension   . Sciatica   . Scoliosis     Past Surgical History:  Procedure Laterality Date  . CLEFT PALATE REPAIR    . EYE SURGERY     "when I was small"  . PERIPHERAL VASCULAR CATHETERIZATION N/A 09/29/2015   Procedure: Dialysis/Perma Catheter Insertion;  Surgeon: Katha Cabal, MD;  Location: [facility];  Service: Cardiovascular;  Laterality: N/A;  . ROTATOR CUFF REPAIR Left    Family History:  Family History  Problem Relation Age of Onset  . Hypertension Other    Family Psychiatric  History: See H&P.  Social History:  History  Alcohol Use No     History  Drug Use No    Social History   Social History  . Marital status: Single    Spouse name: N/A  . Number of children: N/A  . Years of education: N/A   Social History Main Topics  . Smoking status: Never Smoker  . Smokeless tobacco: Never Used  . Alcohol use No  . Drug use: No  . Sexual activity: Not Currently   Other Topics Concern  . None   Social History  Narrative  . None   Additional Social History:    Pain Medications: see PTA meds Prescriptions: see PTA meds Over the Counter: see PTA meds History of alcohol / drug use?: No history of alcohol / drug abuse                    Sleep: Fair  Appetite:  Fair  Current Medications: Current Facility-Administered Medications  Medication Dose Route Frequency Provider Last Rate Last Dose  . acetaminophen (TYLENOL) tablet 650 mg  650 mg Oral Q6H PRN Gonzella Lex, MD   650 mg at 07/17/16 0048  . alum & mag hydroxide-simeth (MAALOX/MYLANTA) 200-200-20 MG/5ML suspension 30 mL  30 mL Oral Q4H PRN Gonzella Lex, MD      . aspirin chewable tablet 81 mg  81 mg Oral Daily Jaydee Conran B Dallys Nowakowski, MD   81 mg at 07/18/16 0836  . calcium acetate (PHOSLO) capsule 1,334 mg  1,334 mg Oral TID WC Lavonia Dana, MD   1,334 mg at 07/18/16 0836  . gabapentin (NEURONTIN) capsule 300 mg  300 mg Oral QHS [place] Fredrickson, MD   300 mg at 07/17/16 2127  . lidocaine-prilocaine (EMLA) cream   Topical Once Lavonia Dana, MD      . magnesium hydroxide (MILK OF MAGNESIA) suspension 30 mL  30 mL Oral Daily PRN Gonzella Lex, MD      .  metoprolol succinate (TOPROL-XL) 24 hr tablet 50 mg  50 mg Oral Daily Gonzella Lex, MD   50 mg at 07/18/16 0836  . mirtazapine (REMERON) tablet 30 mg  30 mg Oral QHS Gonzella Lex, MD   30 mg at 07/17/16 2128  . pantoprazole (PROTONIX) EC tablet 40 mg  40 mg Oral Daily Gonzella Lex, MD   40 mg at 07/18/16 0836  . senna-docusate (Senokot-S) tablet 1 tablet  1 tablet Oral QHS [place] Fredrickson, MD   1 tablet at 07/17/16 2127  . traZODone (DESYREL) tablet 100 mg  100 mg Oral QHS [place] Fredrickson, MD   100 mg at 07/17/16 2128    Lab Results:  Results for orders placed or performed during the hospital encounter of 07/17/16 (from the past 48 hour(s))  CBC     Status: Abnormal   Collection Time: 07/18/16 12:00 PM  Result Value Ref Range   WBC 4.5 3.8 - 10.6 K/uL   RBC  3.56 (L) 4.40 - 5.90 MIL/uL   Hemoglobin 11.2 (L) 13.0 - 18.0 g/dL   HCT 33.0 (L) 40.0 - 52.0 %   MCV 92.6 80.0 - 100.0 fL   MCH 31.4 26.0 - 34.0 pg   MCHC 33.9 32.0 - 36.0 g/dL   RDW 15.2 (H) 11.5 - 14.5 %   Platelets 105 (L) 150 - 440 K/uL    Blood Alcohol level:  Lab Results  Component Value Date   ETH <5 21/19/4174    Metabolic Disorder Labs: Lab Results  Component Value Date   HGBA1C 5.2 07/16/2016   MPG 103 07/16/2016   No results found for: PROLACTIN No results found for: CHOL, TRIG, HDL, CHOLHDL, VLDL, LDLCALC  Physical Findings: AIMS: Facial and Oral Movements Muscles of Facial Expression: None, normal Lips and Perioral Area: None, normal Jaw: None, normal Tongue: None, normal,Extremity Movements Upper (arms, wrists, hands, fingers): None, normal Lower (legs, knees, ankles, toes): None, normal, Trunk Movements Neck, shoulders, hips: None, normal, Overall Severity Severity of abnormal movements (highest score from questions above): None, normal Incapacitation due to abnormal movements: None, normal Patient's awareness of abnormal movements (rate only patient's report): No Awareness, Dental Status Current problems with teeth and/or dentures?: No Does patient usually wear dentures?: No  CIWA:    COWS:     Musculoskeletal: Strength & Muscle Tone: within normal limits Gait & Station: normal Patient leans: N/A  Psychiatric Specialty Exam: Physical Exam  Nursing note and vitals reviewed.   Review of Systems  Constitutional: Positive for malaise/fatigue.  Neurological: Positive for weakness.  Psychiatric/Behavioral: Positive for depression and suicidal ideas.    Blood pressure 100/67, pulse 76, temperature 98 F (36.7 C), temperature source Oral, resp. rate 20, height 6\' 1"  (1.854 m), weight 117.5 kg (259 lb 0.7 oz), SpO2 100 %.Body mass index is 34.18 kg/m.  General Appearance: Casual  Eye Contact:  Good  Speech:  Clear and Coherent  Volume:  Decreased   Mood:  Depressed and Hopeless  Affect:  Blunt  Thought Process:  Goal Directed and Descriptions of Associations: Intact  Orientation:  Full (Time, Place, and Person)  Thought Content:  WDL  Suicidal Thoughts:  Yes.  with intent/plan  Homicidal Thoughts:  No  Memory:  Immediate;   Fair Recent;   Fair Remote;   Fair  Judgement:  Impaired  Insight:  Shallow  Psychomotor Activity:  Psychomotor Retardation  Concentration:  Concentration: Fair and Attention Span: Fair  Recall:  [employer] of  Knowledge:  Fair  Language:  Fair  Akathisia:  No  Handed:  Right  AIMS (if indicated):     Assets:  Communication Skills Desire for Improvement Financial Resources/Insurance Housing Resilience Social Support  ADL's:  Intact  Cognition:  WNL  Sleep:  Number of Hours: 5.15     Treatment Plan Summary: Daily contact with patient to assess and evaluate symptoms and progress in treatment and Medication management   Ronnie Keith is a 55 year old male with a history of developmental disability, depression, and chronic kidney disease on dialysis admitted for suicidal ideation.  1. Suicidal ideation. The patient is able to contract for safety in the hospital.  2. Mood. We will continue Remeron for depression.  3. Insomnia. He did not sleep well with Trazodone. We will start Restroil.    4. GERD. He is on Protonix.  5. Chronic kidney disease. Nephrology consult is greatly appreciated. The patient will continue dialysis.  6. Hypertension. He is on metoprolol.  7. Disposition. He will be discharged back with his mother. He will follow up with his regular psychiatrist   Orson Slick, MD 07/18/2016, 1:24 PM

## 2016-07-18 NOTE — Plan of Care (Signed)
Problem: Safety: Goal: Ability to remain free from injury will improve Outcome: Progressing No self harm reported or observed

## 2016-07-18 NOTE — Progress Notes (Signed)
Patient had dialysis today, no distress post dialysis. Pleasant and cooperative with care. Denies SI, Hi, AVH. No negative behaviors. Drowsy this am. Has not attended group thus far. Encouragement and support offered. Pt receptive and remains safe on unit with q 15 min checks.

## 2016-07-18 NOTE — Progress Notes (Signed)
[facility]  ROUNDING NOTE   Subjective:   Seen and examined on hemodialysis. Tolerating treatment well.   Objective:  Vital signs in last 24 hours:  Temp:  [97.9 F (36.6 C)-98 F (36.7 C)] 98 F (36.7 C) (11/16 1053) Pulse Rate:  [81-84] 81 (11/16 1102) Resp:  [15-16] 16 (11/16 1102) BP: (118-130)/(59-78) 124/77 (11/16 1102) SpO2:  [97 %-100 %] 100 % (11/16 1102) Weight:  [117.5 kg (259 lb 0.7 oz)] 117.5 kg (259 lb 0.7 oz) (11/16 1053)  Weight change:  Filed Weights   07/17/16 0015 07/18/16 1053  Weight: 115.7 kg (255 lb) 117.5 kg (259 lb 0.7 oz)    Intake/Output: I/O last 3 completed shifts: In: 480 [P.O.:480] Out: -    Intake/Output this shift:  Total I/O In: 240 [P.O.:240] Out: -   Physical Exam: General: NAD,   Head: Normocephalic, atraumatic. Moist oral mucosal membranes  Eyes: Anicteric, PERRL  Neck: Supple, trachea midline  Lungs:  Clear to auscultation  Heart: Regular rate and rhythm  Abdomen:  Soft, nontender,   Extremities:  1+ peripheral edema.  Neurologic: Nonfocal, moving all four extremities  Skin: No lesions  Access: Left arm AVF    Basic Metabolic Panel:  Recent Labs Lab 07/16/16 1605  NA 140  K 4.1  CL 99*  CO2 33*  GLUCOSE 89  BUN 26*  CREATININE 5.42*  CALCIUM 8.5*    Liver Function Tests:  Recent Labs Lab 07/16/16 1605  AST 19  ALT 17  ALKPHOS 137*  BILITOT 0.4  PROT 8.1  ALBUMIN 4.2    Recent Labs Lab 07/16/16 1605  LIPASE 44   No results for input(s): AMMONIA in the last 168 hours.  CBC:  Recent Labs Lab 07/16/16 1605  WBC 6.1  HGB 11.9*  HCT 34.4*  MCV 92.7  PLT 129*    Cardiac Enzymes: No results for input(s): CKTOTAL, CKMB, CKMBINDEX, TROPONINI in the last 168 hours.  BNP: Invalid input(s): POCBNP  CBG: No results for input(s): GLUCAP in the last 168 hours.  Microbiology: Results for orders placed or performed during the hospital encounter of 09/19/15  CULTURE, BLOOD  (ROUTINE X 2) w Reflex to PCR ID Panel     Status: None   Collection Time: 09/25/15 11:08 AM  Result Value Ref Range Status   Specimen Description BLOOD LEFT HAND  Final   Special Requests BOTTLES DRAWN AEROBIC AND ANAEROBIC [place]  Final   Culture NO GROWTH 5 DAYS  Final   Report Status 09/30/2015 FINAL  Final  CULTURE, BLOOD (ROUTINE X 2) w Reflex to PCR ID Panel     Status: None   Collection Time: 09/25/15  3:41 PM  Result Value Ref Range Status   Specimen Description BLOOD LINE  Final   Special Requests   Final    BOTTLES DRAWN AEROBIC AND ANAEROBIC AERO 8CC ANA 14CC   Culture NO GROWTH 5 DAYS  Final   Report Status 09/30/2015 FINAL  Final  Wound culture     Status: None   Collection Time: 09/26/15  3:02 PM  Result Value Ref Range Status   Specimen Description WOUND  Final   Special Requests Normal  Final   Gram Stain RARE WBC SEEN NO ORGANISMS SEEN   Final   Culture LIGHT GROWTH STAPHYLOCOCCUS AUREUS  Final   Report Status 09/29/2015 FINAL  Final   Organism ID, Bacteria STAPHYLOCOCCUS AUREUS  Final      Susceptibility   Staphylococcus aureus - MIC*  OXACILLIN Value in next row Sensitive      SENSITIVE<=0.25    GENTAMICIN Value in next row Sensitive      SENSITIVE<=0.5    CIPROFLOXACIN Value in next row Sensitive      SENSITIVE<=0.5    ERYTHROMYCIN Value in next row Sensitive      SENSITIVE<=0.25    CLINDAMYCIN Value in next row Sensitive      SENSITIVE<=0.25    VANCOMYCIN Value in next row Sensitive      SENSITIVE1    TETRACYCLINE Value in next row Sensitive      SENSITIVE<=1    RIFAMPIN Value in next row Sensitive      SENSITIVE<=0.5    TRIMETH/SULFA Value in next row Sensitive      SENSITIVE<=10    * LIGHT GROWTH STAPHYLOCOCCUS AUREUS    Coagulation Studies: No results for input(s): LABPROT, INR in the last 72 hours.  Urinalysis:  Recent Labs  07/16/16 1615  COLORURINE YELLOW*  LABSPEC 1.012  PHURINE 7.0  GLUCOSEU 50*  HGBUR NEGATIVE   BILIRUBINUR NEGATIVE  KETONESUR NEGATIVE  PROTEINUR 100*  NITRITE NEGATIVE  LEUKOCYTESUR NEGATIVE      Imaging: Ct Abdomen Pelvis Wo Contrast  Result Date: 07/16/2016 CLINICAL DATA:  Cough, generalized weakness and abdominal pain. Suicidal ideation. End-stage renal disease on dialysis. EXAM: CT ABDOMEN AND PELVIS WITHOUT CONTRAST TECHNIQUE: Multidetector CT imaging of the abdomen and pelvis was performed following the standard protocol without IV contrast. COMPARISON:  CT abdomen and pelvis September 21, 2015. FINDINGS: LOWER CHEST: 5 mm RIGHT lower lobe ground-glass nodule, below size followup recommendation. Heart size is normal. No pericardial effusions. The visualized heart size is normal. No pericardial effusion. HEPATOBILIARY: Normal. PANCREAS: Normal. SPLEEN: Normal. ADRENALS/URINARY TRACT: Kidneys are orthotopic, stable symmetric atrophy. Multiple bilateral renal cysts measure up to 16 mm LEFT lower pole. No nephrolithiasis, hydronephrosis; limited assessment for renal masses on this nonenhanced examination. The unopacified ureters are normal in course and caliber. Urinary bladder is partially distended and unremarkable. Normal adrenal glands. STOMACH/BOWEL: The stomach, small and large bowel are normal in course and caliber without inflammatory changes. VASCULAR/LYMPHATIC: Aortoiliac vessels are normal in course and caliber. No lymphadenopathy by CT size criteria. REPRODUCTIVE: Dystrophic prostatic calcifications, prostate is not enlarged. OTHER: No intraperitoneal free fluid or free air. MUSCULOSKELETAL: Non-acute. Small fat containing LEFT inguinal hernia. Small fat containing inguinal hernia. Multiple small soft tissue nodules anterior abdominal wall most consistent with injection granulomas. Broad lumbar dextroscoliosis. Moderate to severe degenerative changes lumbar spine. Scattered Schmorl's nodes. Severe LEFT L3-4 and L4-5 neural foraminal narrowing. Severe RIGHT greater than LEFT L5-S1  neural foraminal narrowing. 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 IMPRESSION: No acute intra-abdominal or pelvic process. Atrophic kidneys without urolithiasis or obstructive uropathy. Multilevel severe neural foraminal narrowing. Electronically Signed   By: Elon Alas M.D.   On: 07/16/2016 20:00   Dg Chest 2 View  Result Date: 07/16/2016 CLINICAL DATA:  Cough for several days. Weakness. Abdominal pain. On dialysis. EXAM: CHEST  2 VIEW COMPARISON:  07/11/2016 FINDINGS: Low lung volumes noted. Both lungs appear clear. Heart size is stable. Lordotic positioning noted. IMPRESSION: Stable low lung volumes.  No active lung disease. Electronically Signed   By: Earle Gell M.D.   On: 07/16/2016 17:34     Medications:    . aspirin  81 mg Oral Daily  . calcium acetate  1,334 mg Oral TID WC  . gabapentin  300 mg Oral QHS  . lidocaine-prilocaine  Topical Once  . metoprolol succinate  50 mg Oral Daily  . mirtazapine  30 mg Oral QHS  . pantoprazole  40 mg Oral Daily  . senna-docusate  1 tablet Oral QHS  . traZODone  100 mg Oral QHS   acetaminophen, alum & mag hydroxide-simeth, magnesium hydroxide  Assessment/ Plan:  Mr. ZANDER INGHAM is a 55 y.o. white male with past medical history of developmental deficit, hypertension, chronic low back pain, scoliosis, cardiac ablation for PSVT, ESRD February 2017.   TTS [facility]  1. End Stage Renal Disease: AVF Seen and examined on dialysi TTS schedule  2. Hypertension: Blood pressure at goal.  - metoprolol - gets midodrine before dialysis treatments  3. Anemia of chronic kidney disease:  - Mircera as outpatient. No indication for epo  4. Secondary Hyperparathyroidism: outpatient PTH elevated 774. On 07/02/16. Calcium and phosphorus at goal.  -  calcium acetate with meals.    LOS: [place], [place] 11/16/201711:14 AM

## 2016-07-18 NOTE — Progress Notes (Signed)
Recreation Therapy Notes  Date: 11.16.17 Time: 9:30 am Location: Craft Room  Group Topic: Coping Skills/Leisure Education  Goal Area(s) Addresses:  Patient will identify things they are grateful for. Patient will identify how being grateful can influence decision making.  Behavioral Response: Attentive  Intervention: Immunologist  Activity: Patients were given an I Am Grateful For worksheet and were instructed to write things they were grateful for under each category.  Education: LRT educated patient on why it is important to be grateful.  Education Outcome: In group clarification offered  Clinical Observations/Feedback: Patient wrote things he was grateful for. Patient did not contribute to group discussion.  Leonette Monarch, LRT/CTRS 07/18/2016 10:10 AM

## 2016-07-18 NOTE — Plan of Care (Signed)
Problem: Safety: Goal: Ability to remain free from injury will improve Outcome: Progressing no PRN given, 15 minute checks maintained for safety, clinical and moral support provided, patient encouraged to continue to express feelings and demonstrate safe care. Patient remains free from harm, will continue to monitor.

## 2016-07-18 NOTE — Progress Notes (Signed)
Pre dialysis assessment 

## 2016-07-18 NOTE — [facility] Group Notes (Signed)
[facility] LCSW Group Therapy  07/18/2016 3:10 PM  Type of Therapy:  Group Therapy  Participation Level:  Patient did not attend group. CSW invited patient to group.   Summary of Progress/Problems:Balance in life: Patients will discuss the concept of balance and how it looks and feels to be unbalanced. Pt will identify areas in their life that is unbalanced and ways to become more balanced.    Electa Sterry G. [place], [place] 07/18/2016, 3:11 PM

## 2016-07-18 NOTE — Progress Notes (Signed)
Pre dialysis  

## 2016-07-19 LAB — HEPATITIS B CORE ANTIBODY, TOTAL: HEP B C TOTAL AB: NEGATIVE

## 2016-07-19 LAB — HEPATITIS B SURFACE ANTIGEN: HEP B S AG: NEGATIVE

## 2016-07-19 LAB — HEPATITIS B SURFACE ANTIBODY,QUALITATIVE: Hep B S Ab: NONREACTIVE

## 2016-07-19 MED ORDER — TEMAZEPAM 15 MG PO CAPS
30.0000 mg | ORAL_CAPSULE | Freq: Every day | ORAL | Status: DC
  Administered 2016-07-19 – 2016-07-21 (×3): 30 mg via ORAL
  Filled 2016-07-19 (×3): qty 2

## 2016-07-19 MED ORDER — ARIPIPRAZOLE 2 MG PO TABS
2.0000 mg | ORAL_TABLET | Freq: Every day | ORAL | Status: DC
  Administered 2016-07-19 – 2016-07-22 (×4): 2 mg via ORAL
  Filled 2016-07-19 (×4): qty 1

## 2016-07-19 MED ORDER — GABAPENTIN 300 MG PO CAPS
300.0000 mg | ORAL_CAPSULE | Freq: Three times a day (TID) | ORAL | Status: DC
  Administered 2016-07-19 – 2016-07-21 (×6): 300 mg via ORAL
  Filled 2016-07-19 (×7): qty 1

## 2016-07-19 NOTE — Progress Notes (Signed)
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 He was admitted for voicing suicidal ideation at the dialysis Center on Tuesday. He was consulted by nephrology and continues dialysis here. He is on Remeron. I added low dose Abilify today.  Today, Ronnie Keith continues to complain of depressive symptoms, low energy, fatigue, and malaise. He also complains of leg pain and interrupted sleep. This started when he was placed on dialysis and the patient does not seem to be getting better. At baseline he takes care of himself and drives a car.   Principal Problem: Major depressive disorder, recurrent severe without psychotic features ([place]) Diagnosis:   Patient Active Problem List   Diagnosis Date Noted  . Chronic kidney disease [N18.9] 07/17/2016  . Intellectual disability [F79] 07/16/2016  . Major depressive disorder, recurrent severe without psychotic features ([place]) [F33.2] 09/28/2015  . Hyperkalemia [E87.5] 09/20/2015   Total Time spent with patient: 20 minutes  Past Psychiatric History: Depression.  Past Medical History:  Past Medical History:  Diagnosis Date  . Anxiety   . Chronic kidney disease   . Chronic low back pain   . Hypertension   . Sciatica   . Scoliosis     Past Surgical History:  Procedure Laterality Date  . CLEFT PALATE REPAIR    . EYE SURGERY     "when I was small"  . PERIPHERAL VASCULAR CATHETERIZATION N/A 09/29/2015   Procedure: Dialysis/Perma Catheter Insertion;  Surgeon: Katha Cabal, MD;  Location: [facility];  Service: Cardiovascular;  Laterality: N/A;  . ROTATOR CUFF REPAIR Left    Family History:  Family History  Problem Relation Age of Onset  . Hypertension Other    Family Psychiatric  History: See H&P.  Social History:  History  Alcohol Use No     History  Drug Use  No    Social History   Social History  . Marital status: Single    Spouse name: N/A  . Number of children: N/A  . Years of education: N/A   Social History Main Topics  . Smoking status: Never Smoker  . Smokeless tobacco: Never Used  . Alcohol use No  . Drug use: No  . Sexual activity: Not Currently   Other Topics Concern  . None   Social History Narrative  . None   Additional Social History:    Pain Medications: see PTA meds Prescriptions: see PTA meds Over the Counter: see PTA meds History of alcohol / drug use?: No history of alcohol / drug abuse                    Sleep: Fair  Appetite:  Fair  Current Medications: Current Facility-Administered Medications  Medication Dose Route Frequency Provider Last Rate Last Dose  . acetaminophen (TYLENOL) tablet 650 mg  650 mg Oral Q6H PRN Gonzella Lex, MD   650 mg at 07/17/16 0048  . alum & mag hydroxide-simeth (MAALOX/MYLANTA) 200-200-20 MG/5ML suspension 30 mL  30 mL Oral Q4H PRN Gonzella Lex, MD      . aspirin chewable tablet 81 mg  81 mg Oral Daily Jolanta B Pucilowska, MD   81 mg at 07/19/16 0825  . calcium acetate (PHOSLO) capsule 1,334 mg  1,334 mg Oral TID WC Lavonia Dana, MD   1,334 mg at 07/19/16  0825  . gabapentin (NEURONTIN) capsule 300 mg  300 mg Oral QHS Jolanta B Pucilowska, MD   300 mg at 07/18/16 2134  . lidocaine-prilocaine (EMLA) cream   Topical Once Lavonia Dana, MD      . magnesium hydroxide (MILK OF MAGNESIA) suspension 30 mL  30 mL Oral Daily PRN Gonzella Lex, MD      . metoprolol succinate (TOPROL-XL) 24 hr tablet 50 mg  50 mg Oral Daily Gonzella Lex, MD   50 mg at 07/19/16 0825  . mirtazapine (REMERON) tablet 30 mg  30 mg Oral QHS Gonzella Lex, MD   30 mg at 07/18/16 2134  . pantoprazole (PROTONIX) EC tablet 40 mg  40 mg Oral Daily Gonzella Lex, MD   40 mg at 07/19/16 0825  . senna-docusate (Senokot-S) tablet 1 tablet  1 tablet Oral QHS Clovis Fredrickson, MD   1 tablet at  07/18/16 2134  . temazepam (RESTORIL) capsule 15 mg  15 mg Oral QHS Clovis Fredrickson, MD   15 mg at 07/18/16 2134    Lab Results:  Results for orders placed or performed during the hospital encounter of 07/17/16 (from the past 48 hour(s))  Hepatitis B surface antigen     Status: None   Collection Time: 07/18/16  9:00 AM  Result Value Ref Range   Hepatitis B Surface Ag Negative Negative    Comment: (NOTE) Performed At: [facility] [address] [place], [place] [id number] Lindon Romp MD [id number]   Hepatitis B core antibody, total     Status: None   Collection Time: 07/18/16 12:00 PM  Result Value Ref Range   Hep B Core Total Ab Negative Negative    Comment: (NOTE) Performed At: [facility] [place], [place] [id number] Lindon Romp MD [id number]   Hepatitis B surface antibody     Status: None   Collection Time: 07/18/16 12:00 PM  Result Value Ref Range   Hep B S Ab Non Reactive     Comment: (NOTE)              Non Reactive: Inconsistent with immunity,                            less than 10 mIU/mL              Reactive:     Consistent with immunity,                            greater than 9.9 mIU/mL Performed At: [facility] [place], [place] [id number] Lindon Romp MD [id number]   CBC     Status: Abnormal   Collection Time: 07/18/16 12:00 PM  Result Value Ref Range   WBC 4.5 3.8 - 10.6 K/uL   RBC 3.56 (L) 4.40 - 5.90 MIL/uL   Hemoglobin 11.2 (L) 13.0 - 18.0 g/dL   HCT 33.0 (L) 40.0 - 52.0 %   MCV 92.6 80.0 - 100.0 fL   MCH 31.4 26.0 - 34.0 pg   MCHC 33.9 32.0 - 36.0 g/dL   RDW 15.2 (H) 11.5 - 14.5 %   Platelets 105 (L) 150 - 440 K/uL    Blood Alcohol level:  Lab Results  Component Value Date   ETH <5 97/94/8016    Metabolic Disorder Labs: Lab Results  Component Value Date  HGBA1C 5.2 07/16/2016   MPG 103 07/16/2016   No results found for: PROLACTIN No results found for:  CHOL, TRIG, HDL, CHOLHDL, VLDL, LDLCALC  Physical Findings: AIMS: Facial and Oral Movements Muscles of Facial Expression: None, normal Lips and Perioral Area: None, normal Jaw: None, normal Tongue: None, normal,Extremity Movements Upper (arms, wrists, hands, fingers): None, normal Lower (legs, knees, ankles, toes): None, normal, Trunk Movements Neck, shoulders, hips: None, normal, Overall Severity Severity of abnormal movements (highest score from questions above): None, normal Incapacitation due to abnormal movements: None, normal Patient's awareness of abnormal movements (rate only patient's report): No Awareness, Dental Status Current problems with teeth and/or dentures?: No Does patient usually wear dentures?: No  CIWA:    COWS:     Musculoskeletal: Strength & Muscle Tone: within normal limits Gait & Station: normal Patient leans: N/A  Psychiatric Specialty Exam: Physical Exam  Nursing note and vitals reviewed.   Review of Systems  Constitutional: Positive for malaise/fatigue.  Neurological: Positive for weakness.  Psychiatric/Behavioral: Positive for depression and suicidal ideas.    Blood pressure (!) 112/55, pulse 70, temperature 97.5 F (36.4 C), resp. rate 18, height 6\' 1"  (1.854 m), weight 115 kg (253 lb 8.5 oz), SpO2 100 %.Body mass index is 33.45 kg/m.  General Appearance: Casual  Eye Contact:  Good  Speech:  Clear and Coherent  Volume:  Decreased  Mood:  Depressed and Hopeless  Affect:  Blunt  Thought Process:  Goal Directed and Descriptions of Associations: Intact  Orientation:  Full (Time, Place, and Person)  Thought Content:  WDL  Suicidal Thoughts:  Yes.  with intent/plan  Homicidal Thoughts:  No  Memory:  Immediate;   Fair Recent;   Fair Remote;   Fair  Judgement:  Impaired  Insight:  Shallow  Psychomotor Activity:  Psychomotor Retardation  Concentration:  Concentration: Fair and Attention Span: Fair  Recall:  [employer] of Knowledge:  Fair   Language:  Fair  Akathisia:  No  Handed:  Right  AIMS (if indicated):     Assets:  Communication Skills Desire for Improvement Financial Resources/Insurance Housing Resilience Social Support  ADL's:  Intact  Cognition:  WNL  Sleep:  Number of Hours: 5.3     Treatment Plan Summary: Daily contact with patient to assess and evaluate symptoms and progress in treatment and Medication management   Mr. Arkwright is a 55 year old male with a history of developmental disability, depression, and chronic kidney disease on dialysis admitted for suicidal ideation.  1. Suicidal ideation. The patient is able to contract for safety in the hospital.  2. Mood. We will continue Remeron for depression.  3. Insomnia. He did not sleep well with Trazodone. We will increase Restroil.    4. GERD. He is on Protonix.  5. Chronic kidney disease. Nephrology consult is greatly appreciated. The patient will continue dialysis.  6. Hypertension. He is on metoprolol.  7.Pain. Tylenol is available.  8. Disposition. He will be discharged back with his mother. He will follow up with his regular psychiatrist   Orson Slick, MD 07/19/2016, 11:49 AM

## 2016-07-19 NOTE — Progress Notes (Signed)
Pt continues to present with sad, flat affect. Pt is pleasant and cooperative. Isolates to self and room. Pt had to be encouraged to get out of bed and go to meals. Pt reports feeling extremely sleepy. Pt is med compliant. Dialysis scheduled for Saturday morning. Pt denies SI, HI, AVH, but endorses depression. Pt remains safe on unit with q 15 min checks.

## 2016-07-19 NOTE — Progress Notes (Signed)
Recreation Therapy Notes  Date: 11.17.17 Time: 9:30 am Location: Craft Room  Group Topic: Stress Management  Goal Area(s) Addresses:  Patient will participate in stress management techniques. Patient will verbalize benefit of using stress management techniques.  Behavioral Response: Did not attend  Intervention: Relaxation Techniques  Activity: Patients were given Stress Management techniques. LRT educated patients on stress management techniques and had patients practice the techniques.  Education: LRT educated patients on the importance of using the stress management techniques.  Education Outcome: Patient did not attend group.  Clinical Observations/Feedback: Patient did not attend group.   Leonette Monarch, LRT/CTRS 07/19/2016 10:12 AM

## 2016-07-19 NOTE — Plan of Care (Signed)
Problem: Safety: Goal: Ability to remain free from injury will improve Outcome: Progressing 15 minute checks maintained for safety, clinical and moral support provided, patient encouraged to continue to express feelings and demonstrate safe care. Patient remains free from harm, will continue to monitor.

## 2016-07-19 NOTE — BHH Counselor (Signed)
CSW attempted PSA on 07/18/16, but pt presented as too acute, as evidenced by the pt sleeping soundly after returning from the medical floor where the patient had undergone ECT treatment.  *Late Entry Bobbe Quilter F. Zaylei Mullane, LCSWA, LCAS   07/19/16

## 2016-07-19 NOTE — BHH Group Notes (Signed)
[place] LCSW Group Therapy  07/19/2016 4:24 PM  Type of Therapy:  Group Therapy  Participation Level:  Minimal  Participation Quality:  Drowsy  Affect:  Flat and Lethargic  Cognitive:  Lacking  Insight:  None  Engagement in Therapy:  Limited  Modes of Intervention:  Activity, Discussion, Education and Support  Summary of Progress/Problems:Feelings around Relapse. Group members discussed the meaning of relapse and shared personal stories of relapse, how it affected them and others, and how they perceived themselves during this time. Group members were encouraged to identify triggers, warning signs and coping skills used when facing the possibility of relapse. Social supports were discussed and explored in detail. Patients also discussed facing disappointment and how that can trigger someone to relapse.   Tashawnda Bleiler G. [place], [place] 07/19/2016, 4:25 PM

## 2016-07-19 NOTE — BHH Counselor (Signed)
CSW again attempted PSA on 07/19/16, but pt presented as too acute, as evidenced by the pt sleeping soundly and after the CSW awakened the pt, the pt then politely asked the CSW to "please do it tomorrow instead?".  CSW agreed and asked pt if was okay and pt replied, "I'm just tired".  CSW then exited the room.  Alphonse Guild. Marquail Bradwell, LCSWA, LCAS   07/19/16

## 2016-07-19 NOTE — Progress Notes (Signed)
Sad, distant, lonesome, observed in the dark room sitting up quietly in the wheel chair in the dark; mood and affect flat, "I promised I will not hurt myself, I will be right out, I just want to be by myself..." LFA AV Fistula checked for bleeding, dry skin intact, no dressing noted. Patient later on came out with a front wheel rolling walker to the day room and watched TV with peers.

## 2016-07-20 LAB — CBC
HCT: 31.6 % — ABNORMAL LOW (ref 40.0–52.0)
HEMOGLOBIN: 10.7 g/dL — AB (ref 13.0–18.0)
MCH: 31.1 pg (ref 26.0–34.0)
MCHC: 33.8 g/dL (ref 32.0–36.0)
MCV: 92 fL (ref 80.0–100.0)
Platelets: 105 10*3/uL — ABNORMAL LOW (ref 150–440)
RBC: 3.43 MIL/uL — AB (ref 4.40–5.90)
RDW: 15.3 % — ABNORMAL HIGH (ref 11.5–14.5)
WBC: 5 10*3/uL (ref 3.8–10.6)

## 2016-07-20 LAB — RENAL FUNCTION PANEL
ANION GAP: 9 (ref 5–15)
Albumin: 3.7 g/dL (ref 3.5–5.0)
BUN: 50 mg/dL — ABNORMAL HIGH (ref 6–20)
CALCIUM: 8.4 mg/dL — AB (ref 8.9–10.3)
CO2: 28 mmol/L (ref 22–32)
Chloride: 100 mmol/L — ABNORMAL LOW (ref 101–111)
Creatinine, Ser: 8.58 mg/dL — ABNORMAL HIGH (ref 0.61–1.24)
GFR, EST AFRICAN AMERICAN: 7 mL/min — AB (ref >60)
GFR, EST NON AFRICAN AMERICAN: 6 mL/min — AB (ref >60)
Glucose, Bld: 98 mg/dL (ref 65–99)
Phosphorus: 3.8 mg/dL (ref 2.5–4.6)
Potassium: 4.5 mmol/L (ref 3.5–5.1)
SODIUM: 137 mmol/L (ref 135–145)

## 2016-07-20 MED ORDER — LIDOCAINE-PRILOCAINE 2.5-2.5 % EX CREA
TOPICAL_CREAM | Freq: Once | CUTANEOUS | Status: AC
  Administered 2016-07-20: 10:00:00 via TOPICAL
  Filled 2016-07-20: qty 5

## 2016-07-20 NOTE — Plan of Care (Signed)
Problem: Health Behavior/Discharge Planning: Goal: Ability to manage health-related needs will improve Outcome: Progressing Patient continues to go to dialysis as scheduled and is medication compliant. Voices if he has any concerns pertaining to his health and mental health

## 2016-07-20 NOTE — Progress Notes (Signed)
Start of hd 

## 2016-07-20 NOTE — Progress Notes (Signed)
Patient has been in bed since 5pm stating his "stomach was hurting" and he was not feeling well. Asked if He states he did not want any medication, just to lay there and see if it gets better. Went to recheck patient at 6pm and he still was not feeling well and did not want to take the medication. Asked again if he wanted some medication, but he still refused. Will continue to monitor.

## 2016-07-20 NOTE — BHH Counselor (Signed)
CSW attempted to complete assessment with patient however patient was taken to his dialysis treatment. Per nursing staff, patient will likely return to the unit around 2pm.  [name], [place] 07/20/2016 10:40 AM

## 2016-07-20 NOTE — Progress Notes (Signed)
D: Pt denies SI/HI/AVH. Pt is pleasant and cooperative, affect is flat and sad but brightens up upon approach.  Pt  is interacting with peers and staff appropriately.  A: Pt was offered support and encouragement. Pt was given scheduled medications. Pt was encouraged to attend groups. Q 15 minute checks were done for safety.  R:Pt attends groups and interacts well with peers and staff. Pt is taking medication. Pt has no complaints.Pt receptive to treatment and safety maintained on unit.

## 2016-07-20 NOTE — Progress Notes (Signed)
[facility] MD Progress Note  07/20/2016 11:41 AM BRAXDEN LOVERING  MRN:  [id number]  Subjective:  Mr. 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 He was admitted for voicing suicidal ideation at the dialysis Center on Tuesday. He was consulted by nephrology and continues dialysis here. He is on Remeron. I added low dose Abilify today.  11/17 Mr. Retter continues to complain of depressive symptoms, low energy, fatigue, and malaise. He also complains of leg pain and interrupted sleep. This started when he was placed on dialysis and the patient does not seem to be getting better. At baseline he takes care of himself and drives a car.   79/02 patient says he is not feeling well. He was in dialysis this morning. He is sad that he has to sit there for 3 hours. His mood appeared dysphoric. Says he is not sleeping well at night he feels very tired during the daytime. He denies suicidality, homicidality or auditory or visual hallucinations. He denies side effects of medications or physical complaints.  Per nursing: Pt denies SI/HI/AVH. Pt is pleasant and cooperative, affect is flat and sad but brightens up upon approach.  Pt  is interacting with peers and staff appropriately  Principal Problem: Major depressive disorder, recurrent severe without psychotic features ([place]) Diagnosis:   Patient Active Problem List   Diagnosis Date Noted  . Chronic kidney disease [N18.9] 07/17/2016  . Intellectual disability [F79] 07/16/2016  . Major depressive disorder, recurrent severe without psychotic features ([place]) [F33.2] 09/28/2015  . Hyperkalemia [E87.5] 09/20/2015   Total Time spent with patient: 20 minutes  Past Psychiatric History: Depression.  Past Medical History:  Past Medical History:  Diagnosis Date  . Anxiety   . Chronic kidney disease   . Chronic low back pain   . Hypertension   . Sciatica   . Scoliosis     Past Surgical  History:  Procedure Laterality Date  . CLEFT PALATE REPAIR    . EYE SURGERY     "when I was small"  . PERIPHERAL VASCULAR CATHETERIZATION N/A 09/29/2015   Procedure: Dialysis/Perma Catheter Insertion;  Surgeon: Katha Cabal, MD;  Location: [facility];  Service: Cardiovascular;  Laterality: N/A;  . ROTATOR CUFF REPAIR Left    Family History:  Family History  Problem Relation Age of Onset  . Hypertension Other    Family Psychiatric  History: See H&P.  Social History:  History  Alcohol Use No     History  Drug Use No    Social History   Social History  . Marital status: Single    Spouse name: N/A  . Number of children: N/A  . Years of education: N/A   Social History Main Topics  . Smoking status: Never Smoker  . Smokeless tobacco: Never Used  . Alcohol use No  . Drug use: No  . Sexual activity: Not Currently   Other Topics Concern  . None   Social History Narrative  . None   Additional Social History:    Pain Medications: see PTA meds Prescriptions: see PTA meds Over the Counter: see PTA meds History of alcohol / drug use?: No history of alcohol / drug abuse                    Sleep: Fair  Appetite:  Fair  Current Medications: Current Facility-Administered Medications  Medication Dose Route Frequency Provider Last Rate Last Dose  .  acetaminophen (TYLENOL) tablet 650 mg  650 mg Oral Q6H PRN Gonzella Lex, MD   650 mg at 07/17/16 0048  . alum & mag hydroxide-simeth (MAALOX/MYLANTA) 200-200-20 MG/5ML suspension 30 mL  30 mL Oral Q4H PRN Gonzella Lex, MD      . ARIPiprazole (ABILIFY) tablet 2 mg  2 mg Oral Daily Jolanta B Pucilowska, MD   2 mg at 07/20/16 0900  . aspirin chewable tablet 81 mg  81 mg Oral Daily Jolanta B Pucilowska, MD   81 mg at 07/20/16 0900  . calcium acetate (PHOSLO) capsule 1,334 mg  1,334 mg Oral TID WC Lavonia Dana, MD   1,334 mg at 07/20/16 0900  . gabapentin (NEURONTIN) capsule 300 mg  300 mg Oral TID Clovis Fredrickson, MD   300 mg at 07/20/16 0900  . lidocaine-prilocaine (EMLA) cream   Topical Once Lavonia Dana, MD      . magnesium hydroxide (MILK OF MAGNESIA) suspension 30 mL  30 mL Oral Daily PRN Gonzella Lex, MD      . metoprolol succinate (TOPROL-XL) 24 hr tablet 50 mg  50 mg Oral Daily Gonzella Lex, MD   50 mg at 07/19/16 0825  . mirtazapine (REMERON) tablet 30 mg  30 mg Oral QHS Gonzella Lex, MD   30 mg at 07/19/16 2214  . pantoprazole (PROTONIX) EC tablet 40 mg  40 mg Oral Daily Gonzella Lex, MD   40 mg at 07/20/16 0900  . senna-docusate (Senokot-S) tablet 1 tablet  1 tablet Oral QHS Clovis Fredrickson, MD   1 tablet at 07/19/16 2214  . temazepam (RESTORIL) capsule 30 mg  30 mg Oral QHS Jolanta B Pucilowska, MD   30 mg at 07/19/16 2214    Lab Results:  Results for orders placed or performed during the hospital encounter of 07/17/16 (from the past 48 hour(s))  Hepatitis B core antibody, total     Status: None   Collection Time: 07/18/16 12:00 PM  Result Value Ref Range   Hep B Core Total Ab Negative Negative    Comment: (NOTE) Performed At: [facility] [address] [place], [place] [id number] Lindon Romp MD [id number]   Hepatitis B surface antibody     Status: None   Collection Time: 07/18/16 12:00 PM  Result Value Ref Range   Hep B S Ab Non Reactive     Comment: (NOTE)              Non Reactive: Inconsistent with immunity,                            less than 10 mIU/mL              Reactive:     Consistent with immunity,                            greater than 9.9 mIU/mL Performed At: [facility] [place], [place] [id number] Lindon Romp MD [id number]   CBC     Status: Abnormal   Collection Time: 07/18/16 12:00 PM  Result Value Ref Range   WBC 4.5 3.8 - 10.6 K/uL   RBC 3.56 (L) 4.40 - 5.90 MIL/uL   Hemoglobin 11.2 (L) 13.0 - 18.0 g/dL   HCT 33.0 (L) 40.0 - 52.0 %   MCV 92.6 80.0 - 100.0 fL   MCH  31.4 26.0 -  34.0 pg   MCHC 33.9 32.0 - 36.0 g/dL   RDW 15.2 (H) 11.5 - 14.5 %   Platelets 105 (L) 150 - 440 K/uL    Blood Alcohol level:  Lab Results  Component Value Date   ETH <5 40/98/1191    Metabolic Disorder Labs: Lab Results  Component Value Date   HGBA1C 5.2 07/16/2016   MPG 103 07/16/2016   No results found for: PROLACTIN No results found for: CHOL, TRIG, HDL, CHOLHDL, VLDL, LDLCALC  Physical Findings: AIMS: Facial and Oral Movements Muscles of Facial Expression: None, normal Lips and Perioral Area: None, normal Jaw: None, normal Tongue: None, normal,Extremity Movements Upper (arms, wrists, hands, fingers): None, normal Lower (legs, knees, ankles, toes): None, normal, Trunk Movements Neck, shoulders, hips: None, normal, Overall Severity Severity of abnormal movements (highest score from questions above): None, normal Incapacitation due to abnormal movements: None, normal Patient's awareness of abnormal movements (rate only patient's report): No Awareness, Dental Status Current problems with teeth and/or dentures?: No Does patient usually wear dentures?: No  CIWA:    COWS:     Musculoskeletal: Strength & Muscle Tone: within normal limits Gait & Station: normal Patient leans: N/A  Psychiatric Specialty Exam: Physical Exam  Nursing note and vitals reviewed.   Review of Systems  Constitutional: Positive for malaise/fatigue.  Neurological: Positive for weakness.  Psychiatric/Behavioral: Positive for depression and suicidal ideas.    Blood pressure 119/80, pulse 73, temperature 98 F (36.7 C), temperature source Oral, resp. rate 14, height 6\' 1"  (1.854 m), weight 115 kg (253 lb 8.5 oz), SpO2 100 %.Body mass index is 33.45 kg/m.  General Appearance: Casual  Eye Contact:  Good  Speech:  Clear and Coherent  Volume:  Decreased  Mood:  Depressed and Hopeless  Affect:  Blunt  Thought Process:  Goal Directed and Descriptions of Associations: Intact  Orientation:  Full  (Time, Place, and Person)  Thought Content:  WDL  Suicidal Thoughts:  Yes.  with intent/plan  Homicidal Thoughts:  No  Memory:  Immediate;   Fair Recent;   Fair Remote;   Fair  Judgement:  Impaired  Insight:  Shallow  Psychomotor Activity:  Psychomotor Retardation  Concentration:  Concentration: Fair and Attention Span: Fair  Recall:  [employer] of Knowledge:  Fair  Language:  Fair  Akathisia:  No  Handed:  Right  AIMS (if indicated):     Assets:  Communication Skills Desire for Improvement Financial Resources/Insurance Housing Resilience Social Support  ADL's:  Intact  Cognition:  WNL  Sleep:  Number of Hours: 6     Treatment Plan Summary: Daily contact with patient to assess and evaluate symptoms and progress in treatment and Medication management   Mr. Soeder is a 55 year old male with a history of developmental disability, depression, and chronic kidney disease on dialysis admitted for suicidal ideation.  1. Suicidal ideation. The patient is able to contract for safety in the hospital.  2. Mood. We will continue Remeron for depression.  3. Insomnia. He did not sleep well with Trazodone. We will increase Restroil.    4. GERD. He is on Protonix.  5. Chronic kidney disease. Nephrology consult is greatly appreciated. The patient will continue dialysis.  6. Hypertension. He is on metoprolol.  7.Pain. Tylenol is available.  8. Disposition. He will be discharged back with his mother. He will follow up with his regular psychiatrist  Patient is already on mirtazapine 30 mg and also on Restoril 30 mg.  He continues to state he is not sleeping well. For now we'll not make any changes.  I will recommend to the primary team to consider Seroquel instead of Abilify.   Hildred Priest, MD 07/20/2016, 11:41 AM

## 2016-07-20 NOTE — Progress Notes (Signed)
Post hd assessment 

## 2016-07-20 NOTE — Progress Notes (Signed)
Pre hd info 

## 2016-07-20 NOTE — Progress Notes (Signed)
[facility]  ROUNDING NOTE   Subjective:   Seen and examined on hemodialysis. Tolerating treatment well. Uf goal of 1.5 litres  Objective:  Vital signs in last 24 hours:  Temp:  [97.8 F (36.6 C)-98 F (36.7 C)] 98 F (36.7 C) (11/18 1030) Pulse Rate:  [73-78] 73 (11/18 1210) Resp:  [12-16] 12 (11/18 1210) BP: (109-125)/(61-87) 125/73 (11/18 1210) SpO2:  [100 %] 100 % (11/18 1210)  Weight change:  Filed Weights   07/17/16 0015 07/18/16 1053 07/18/16 1400  Weight: 115.7 kg (255 lb) 117.5 kg (259 lb 0.7 oz) 115 kg (253 lb 8.5 oz)    Intake/Output: I/O last 3 completed shifts: In: 240 [P.O.:240] Out: -    Intake/Output this shift:  Total I/O In: 480 [P.O.:480] Out: -   Physical Exam: General: NAD,   Head: Normocephalic, atraumatic. Moist oral mucosal membranes  Eyes: Anicteric, PERRL  Neck: Supple, trachea midline  Lungs:  Clear to auscultation  Heart: Regular rate and rhythm  Abdomen:  Soft, nontender,   Extremities: trace peripheral edema.  Neurologic: Nonfocal, moving all four extremities  Skin: No lesions  Access: Left arm AVF    Basic Metabolic Panel:  Recent Labs Lab 07/16/16 1605  NA 140  K 4.1  CL 99*  CO2 33*  GLUCOSE 89  BUN 26*  CREATININE 5.42*  CALCIUM 8.5*    Liver Function Tests:  Recent Labs Lab 07/16/16 1605  AST 19  ALT 17  ALKPHOS 137*  BILITOT 0.4  PROT 8.1  ALBUMIN 4.2    Recent Labs Lab 07/16/16 1605  LIPASE 44   No results for input(s): AMMONIA in the last 168 hours.  CBC:  Recent Labs Lab 07/16/16 1605 07/18/16 1200 07/20/16 1110  WBC 6.1 4.5 5.0  HGB 11.9* 11.2* 10.7*  HCT 34.4* 33.0* 31.6*  MCV 92.7 92.6 92.0  PLT 129* 105* 105*    Cardiac Enzymes: No results for input(s): CKTOTAL, CKMB, CKMBINDEX, TROPONINI in the last 168 hours.  BNP: Invalid input(s): POCBNP  CBG: No results for input(s): GLUCAP in the last 168 hours.  Microbiology: Results for orders placed or performed  during the hospital encounter of 09/19/15  CULTURE, BLOOD (ROUTINE X 2) w Reflex to PCR ID Panel     Status: None   Collection Time: 09/25/15 11:08 AM  Result Value Ref Range Status   Specimen Description BLOOD LEFT HAND  Final   Special Requests BOTTLES DRAWN AEROBIC AND ANAEROBIC [place]  Final   Culture NO GROWTH 5 DAYS  Final   Report Status 09/30/2015 FINAL  Final  CULTURE, BLOOD (ROUTINE X 2) w Reflex to PCR ID Panel     Status: None   Collection Time: 09/25/15  3:41 PM  Result Value Ref Range Status   Specimen Description BLOOD LINE  Final   Special Requests   Final    BOTTLES DRAWN AEROBIC AND ANAEROBIC AERO 8CC ANA 14CC   Culture NO GROWTH 5 DAYS  Final   Report Status 09/30/2015 FINAL  Final  Wound culture     Status: None   Collection Time: 09/26/15  3:02 PM  Result Value Ref Range Status   Specimen Description WOUND  Final   Special Requests Normal  Final   Gram Stain RARE WBC SEEN NO ORGANISMS SEEN   Final   Culture LIGHT GROWTH STAPHYLOCOCCUS AUREUS  Final   Report Status 09/29/2015 FINAL  Final   Organism ID, Bacteria STAPHYLOCOCCUS AUREUS  Final  Susceptibility   Staphylococcus aureus - MIC*    OXACILLIN Value in next row Sensitive      SENSITIVE<=0.25    GENTAMICIN Value in next row Sensitive      SENSITIVE<=0.5    CIPROFLOXACIN Value in next row Sensitive      SENSITIVE<=0.5    ERYTHROMYCIN Value in next row Sensitive      SENSITIVE<=0.25    CLINDAMYCIN Value in next row Sensitive      SENSITIVE<=0.25    VANCOMYCIN Value in next row Sensitive      SENSITIVE1    TETRACYCLINE Value in next row Sensitive      SENSITIVE<=1    RIFAMPIN Value in next row Sensitive      SENSITIVE<=0.5    TRIMETH/SULFA Value in next row Sensitive      SENSITIVE<=10    * LIGHT GROWTH STAPHYLOCOCCUS AUREUS    Coagulation Studies: No results for input(s): LABPROT, INR in the last 72 hours.  Urinalysis: No results for input(s): COLORURINE, LABSPEC, PHURINE,  GLUCOSEU, HGBUR, BILIRUBINUR, KETONESUR, PROTEINUR, UROBILINOGEN, NITRITE, LEUKOCYTESUR in the last 72 hours.  Invalid input(s): APPERANCEUR    Imaging: No results found.   Medications:    . ARIPiprazole  2 mg Oral Daily  . aspirin  81 mg Oral Daily  . calcium acetate  1,334 mg Oral TID WC  . gabapentin  300 mg Oral TID  . lidocaine-prilocaine   Topical Once  . metoprolol succinate  50 mg Oral Daily  . mirtazapine  30 mg Oral QHS  . pantoprazole  40 mg Oral Daily  . senna-docusate  1 tablet Oral QHS  . temazepam  30 mg Oral QHS   acetaminophen, alum & mag hydroxide-simeth, magnesium hydroxide  Assessment/ Plan:  Mr. HENNING EHLE is a 55 y.o. white male with past medical history of developmental deficit, hypertension, chronic low back pain, scoliosis, cardiac ablation for PSVT, ESRD February 2017.   TTS [facility]  1. End Stage Renal Disease: AVF Seen and examined on dialysis TTS schedule  2. Hypertension: Blood pressure at goal.  - metoprolol - gets midodrine before dialysis treatments  3. Anemia of chronic kidney disease:  - Mircera as outpatient. No indication for epo  4. Secondary Hyperparathyroidism: outpatient PTH elevated 774. On 07/02/16. Calcium and phosphorus at goal.  -  calcium acetate with meals.    LOS: [place], Drayson Dorko 11/18/201712:21 PM

## 2016-07-20 NOTE — Progress Notes (Signed)
Pre hd assessment  

## 2016-07-20 NOTE — Progress Notes (Signed)
Patient has been off the unit today for hemodialysis. Left around 1015. Was given morning medication and ate breakfast. Some medication was held and then given to patient upon return to the unit. Also was given a try for lunch when returned to the unit. Complained of having a low energy level and fatigue. Isolates to his room most of the day except for meals. AVF working, bruit heard and thrill felt.  Denies SI/HI AVH. Q15 minutes checks maintained for safety. Had 1.5 liters of fluid taken off at dialysis per report. Vitals upon return to the unit were stable 255lbs, 129/67, 73 100%, and 97.6. Patient was A&O as well. Does use a walker to ambulate around the unit. Will continue to monitor.

## 2016-07-20 NOTE — BHH Group Notes (Signed)
[facility] LCSW Group Therapy  07/20/2016 4:10 PM  Type of Therapy:  Group Therapy  Participation Level:  Patient did not attend group on this date because he was still in dialysis treatment.   Summary of Progress/Problems:Self esteem: Patients discussed self esteem and how it impacts them. They discussed what aspects in their lives has influenced their self esteem. They were challenged to identify changes that are needed in order to improve self esteem. Patients participated in activity where they had to identify positive adjectives they felt described their personality. Patients shared with the group on the following areas: Things I am good at, What I like about my appearance, I've helped others by, What I value the most, compliments I have received, challenges I have overcome, thing that make me unique, and Times I've made others happy. Patients also participated in positive socialization with a group game of "Jenga".   Alizeh Madril G. [place], [place] 07/20/2016, 4:11 PM

## 2016-07-20 NOTE — Progress Notes (Signed)
  End of hd 

## 2016-07-20 NOTE — Plan of Care (Signed)
Problem: Coping: Goal: Ability to verbalize frustrations and anger appropriately will improve Outcome: Progressing Patient verbalized concerns to staff.

## 2016-07-20 NOTE — Progress Notes (Signed)
Post hd vitals 

## 2016-07-21 NOTE — Progress Notes (Signed)
Patient appeared brighter after having visit from family members and continues to interact more with peers.

## 2016-07-21 NOTE — [facility] Group Notes (Signed)
[facility] LCSW Group Therapy  07/21/2016 1:25 PM  Type of Therapy:  Group Therapy  Participation Level:  Minimal  Participation Quality:  Attentive and Sharing  Affect:  Tearful  Cognitive:  Alert  Insight:  Limited  Engagement in Therapy:  Improving  Modes of Intervention:  Activity, Discussion, Education, Reality Testing, Socialization and Support  Summary of Progress/Problems:Safety Planning: Patients identified fears or worries surrounding discharge. Patients offered support to their peers and openly developed safety plans for their individual needs. Patients developed their own safety plan. Patients discussed their warning signs, coping strategies, support system with family and friends, identified mental health professionals, and how to keep their environments safe (ex. Removing unnecessary medications or removing weapons/guns). Patients then discussed their personalized safety plan with the group. Patient opened up with the group about his depression stating "I just feel like people look at me as stupid. I just don't feel like I fit in". CSW and peers provided support to patient and offered positive statements to patient for sharing. Patient became tearful and left group. Both CSW and patient nurse checked on patient after group. Patient stated he was fine he was just overwhelmed because he has never opened up like that before in a group setting. CSW encouraged patient to continue to share with staff how we can best help him as he copes with his depression.    Kamla Skilton G. [place], [place] 07/21/2016, 1:30 PM

## 2016-07-21 NOTE — BHH Suicide Risk Assessment (Signed)
[address] INPATIENT:  Family/Significant Other Suicide Prevention Education  Suicide Prevention Education:  Patient Refusal for Family/Significant Other Suicide Prevention Education: The patient Ronnie Keith has refused to provide written consent for family/significant other to be provided Family/Significant Other Suicide Prevention Education during admission and/or prior to discharge.  Physician notified.  Pedro Oldenburg G. [place], [place] 07/21/2016, 10:51 AM

## 2016-07-21 NOTE — Plan of Care (Signed)
Problem: Coping: Goal: Ability to cope will improve Outcome: Not Progressing Patient just wants to sit in his room in the dark and not be around others

## 2016-07-21 NOTE — Progress Notes (Addendum)
[facility] MD Progress Note  07/21/2016 3:16 PM Ronnie Keith  MRN:  [id number]  Subjective:  Ronnie Keith is a 55 year old male with a history of mild developmental disability, depression, and end-stage kidney disease on dialysis. He was admitted for voicing suicidal ideation at the dialysis Center on Tuesday. He was consulted by nephrology and continues dialysis here. He is on Remeron. I added low dose Abilify today.  11/17 Ronnie Keith continues to complain of depressive symptoms, low energy, fatigue, and malaise. He also complains of leg pain and interrupted sleep. This started when he was placed on dialysis and the Ronnie Keith does not seem to be getting better. At baseline he takes care of himself and drives a car.   40/98 Ronnie Keith says he is not feeling well. He was in dialysis this morning. He is sad that he has to sit there for 3 hours. His mood appeared dysphoric. Says he is not sleeping well at night he feels very tired during the daytime. He denies suicidality, homicidality or auditory or visual hallucinations. He denies side effects of medications or physical complaints.  11/19 Ronnie Keith reports having passive suicidal ideation. Has thoughts about going to sleep and awake. He describes his mood as bad. Today he started sleeping well. His appetite is fair. Continues to feel weak and tired, states that his body is aching all over. Isolatory or visual hallucinations today but says sometimes she hears sounds in his room such as a train and birds.  Per nursing: Ronnie Keith has been in room and day area most of the day. Has been watching tv. Interacting with peers more today, opened up in group about depression. Stated to writer this a.m. "I just want to be alone in the dark, go to sleep, and never wake up". Has been alone the last 5 to 6 years. Gave his advice on how to cope and therapies to help with the depression. When asked about SI and why he is depressed, states "I don"t know" and 'I am not sure". Does contract for  safety, not suicidal or have any type of plan. No HI or AVH. Safety maintained with Q15 minute checks. Support offered. Will continue to monitor.  Principal Problem: Major depressive disorder, recurrent severe without psychotic features ([place]) Diagnosis:   Ronnie Keith Active Problem List   Diagnosis Date Noted  . Chronic kidney disease [N18.9] 07/17/2016  . Intellectual disability [F79] 07/16/2016  . Major depressive disorder, recurrent severe without psychotic features ([place]) [F33.2] 09/28/2015  . Hyperkalemia [E87.5] 09/20/2015   Total Time spent with Ronnie Keith: 20 minutes  Past Psychiatric History: Depression.  Past Medical History:  Past Medical History:  Diagnosis Date  . Anxiety   . Chronic kidney disease   . Chronic low back pain   . Hypertension   . Sciatica   . Scoliosis     Past Surgical History:  Procedure Laterality Date  . CLEFT PALATE REPAIR    . EYE SURGERY     "when I was small"  . PERIPHERAL VASCULAR CATHETERIZATION N/A 09/29/2015   Procedure: Dialysis/Perma Catheter Insertion;  Surgeon: Katha Cabal, MD;  Location: [facility];  Service: Cardiovascular;  Laterality: N/A;  . ROTATOR CUFF REPAIR Left    Family History:  Family History  Problem Relation Age of Onset  . Hypertension Other    Family Psychiatric  History: See H&P.  Social History:  History  Alcohol Use No     History  Drug Use No    Social History  Social History  . Marital status: Single    Spouse name: N/A  . Number of children: N/A  . Years of education: N/A   Social History Main Topics  . Smoking status: Never Smoker  . Smokeless tobacco: Never Used  . Alcohol use No  . Drug use: No  . Sexual activity: Not Currently   Other Topics Concern  . None   Social History Narrative  . None   Additional Social History:    Pain Medications: see PTA meds Prescriptions: see PTA meds Over the Counter: see PTA meds History of alcohol / drug use?: No history of alcohol /  drug abuse                    Sleep: Fair  Appetite:  Fair  Current Medications: Current Facility-Administered Medications  Medication Dose Route Frequency Provider Last Rate Last Dose  . acetaminophen (TYLENOL) tablet 650 mg  650 mg Oral Q6H PRN Gonzella Lex, MD   650 mg at 07/17/16 0048  . alum & mag hydroxide-simeth (MAALOX/MYLANTA) 200-200-20 MG/5ML suspension 30 mL  30 mL Oral Q4H PRN Gonzella Lex, MD      . ARIPiprazole (ABILIFY) tablet 2 mg  2 mg Oral Daily Clovis Fredrickson, MD   2 mg at 07/21/16 0816  . aspirin chewable tablet 81 mg  81 mg Oral Daily Jolanta B Pucilowska, MD   81 mg at 07/21/16 0816  . calcium acetate (PHOSLO) capsule 1,334 mg  1,334 mg Oral TID WC Lavonia Dana, MD   1,334 mg at 07/21/16 1213  . lidocaine-prilocaine (EMLA) cream   Topical Once Lavonia Dana, MD      . magnesium hydroxide (MILK OF MAGNESIA) suspension 30 mL  30 mL Oral Daily PRN Gonzella Lex, MD      . metoprolol succinate (TOPROL-XL) 24 hr tablet 50 mg  50 mg Oral Daily Gonzella Lex, MD   50 mg at 07/21/16 0816  . mirtazapine (REMERON) tablet 30 mg  30 mg Oral QHS Gonzella Lex, MD   30 mg at 07/20/16 2137  . pantoprazole (PROTONIX) EC tablet 40 mg  40 mg Oral Daily Gonzella Lex, MD   40 mg at 07/21/16 0816  . senna-docusate (Senokot-S) tablet 1 tablet  1 tablet Oral QHS Clovis Fredrickson, MD   1 tablet at 07/20/16 2136  . temazepam (RESTORIL) capsule 30 mg  30 mg Oral QHS Clovis Fredrickson, MD   30 mg at 07/20/16 2136    Lab Results:  Results for orders placed or performed during the hospital encounter of 07/17/16 (from the past 48 hour(s))  CBC     Status: Abnormal   Collection Time: 07/20/16 11:10 AM  Result Value Ref Range   WBC 5.0 3.8 - 10.6 K/uL   RBC 3.43 (L) 4.40 - 5.90 MIL/uL   Hemoglobin 10.7 (L) 13.0 - 18.0 g/dL   HCT 31.6 (L) 40.0 - 52.0 %   MCV 92.0 80.0 - 100.0 fL   MCH 31.1 26.0 - 34.0 pg   MCHC 33.8 32.0 - 36.0 g/dL   RDW 15.3 (H) 11.5 -  14.5 %   Platelets 105 (L) 150 - 440 K/uL  Renal function panel     Status: Abnormal   Collection Time: 07/20/16 11:10 AM  Result Value Ref Range   Sodium 137 135 - 145 mmol/L   Potassium 4.5 3.5 - 5.1 mmol/L   Chloride 100 (L) 101 - 111 mmol/L  CO2 28 22 - 32 mmol/L   Glucose, Bld 98 65 - 99 mg/dL   BUN 50 (H) 6 - 20 mg/dL   Creatinine, Ser 8.58 (H) 0.61 - 1.24 mg/dL   Calcium 8.4 (L) 8.9 - 10.3 mg/dL   Phosphorus 3.8 2.5 - 4.6 mg/dL   Albumin 3.7 3.5 - 5.0 g/dL   GFR calc non Af Amer 6 (L) >60 mL/min   GFR calc Af Amer 7 (L) >60 mL/min    Comment: (NOTE) The eGFR has been calculated using the CKD EPI equation. This calculation has not been validated in all clinical situations. eGFR's persistently <60 mL/min signify possible Chronic Kidney Disease.    Anion gap 9 5 - 15    Blood Alcohol level:  Lab Results  Component Value Date   ETH <5 12/87/8676    Metabolic Disorder Labs: Lab Results  Component Value Date   HGBA1C 5.2 07/16/2016   MPG 103 07/16/2016   No results found for: PROLACTIN No results found for: CHOL, TRIG, HDL, CHOLHDL, VLDL, LDLCALC  Physical Findings: AIMS: Facial and Oral Movements Muscles of Facial Expression: None, normal Lips and Perioral Area: None, normal Jaw: None, normal Tongue: None, normal,Extremity Movements Upper (arms, wrists, hands, fingers): None, normal Lower (legs, knees, ankles, toes): None, normal, Trunk Movements Neck, shoulders, hips: None, normal, Overall Severity Severity of abnormal movements (highest score from questions above): None, normal Incapacitation due to abnormal movements: None, normal Ronnie Keith's awareness of abnormal movements (rate only Ronnie Keith's report): No Awareness, Dental Status Current problems with teeth and/or dentures?: No Does Ronnie Keith usually wear dentures?: No  CIWA:    COWS:     Musculoskeletal: Strength & Muscle Tone: within normal limits Gait & Station: normal Ronnie Keith leans:  N/A  Psychiatric Specialty Exam: Physical Exam  Nursing note and vitals reviewed. Constitutional: He is oriented to person, place, and time. He appears well-developed and well-nourished.  HENT:  Head: Normocephalic and atraumatic.  Eyes: EOM are normal.  Neck: Normal range of motion.  Musculoskeletal: Normal range of motion.  Neurological: He is alert and oriented to person, place, and time.    Review of Systems  Constitutional: Positive for malaise/fatigue.  Neurological: Positive for weakness.  Psychiatric/Behavioral: Positive for depression and suicidal ideas.    Blood pressure 139/75, pulse 90, temperature 97.9 F (36.6 C), temperature source Oral, resp. rate 16, height '6\' 1"'  (1.854 m), weight 115 kg (253 lb 8.5 oz), SpO2 100 %.Body mass index is 33.45 kg/m.  General Appearance: Casual  Eye Contact:  Good  Speech:  Clear and Coherent  Volume:  Decreased  Mood:  Depressed and Hopeless  Affect:  Blunt  Thought Process:  Goal Directed and Descriptions of Associations: Intact  Orientation:  Full (Time, Place, and Person)  Thought Content:  WDL  Suicidal Thoughts:  Yes.  with intent/plan  Homicidal Thoughts:  No  Memory:  Immediate;   Fair Recent;   Fair Remote;   Fair  Judgement:  Impaired  Insight:  Shallow  Psychomotor Activity:  Psychomotor Retardation  Concentration:  Concentration: Fair and Attention Span: Fair  Recall:  [employer] of Knowledge:  Fair  Language:  Fair  Akathisia:  No  Handed:  Right  AIMS (if indicated):     Assets:  Communication Skills Desire for Improvement Financial Resources/Insurance Housing Resilience Social Support  ADL's:  Intact  Cognition:  WNL  Sleep:  Number of Hours: 5.25     Treatment Plan Summary: Daily contact with Ronnie Keith to assess  and evaluate symptoms and progress in treatment and Medication management   Ronnie Keith is a 55 year old male with a history of developmental disability, depression, and chronic kidney disease  on dialysis admitted for suicidal ideation.  1. Suicidal ideation. The Ronnie Keith is able to contract for safety in the hospital.  2. Mood. We will continue Remeron for depression.  3. Insomnia. He did not sleep well with Trazodone. We will increase Restroil.    4. GERD. He is on Protonix.  5. Chronic kidney disease. Nephrology consult is greatly appreciated. The Ronnie Keith will continue dialysis.  6. Hypertension. He is on metoprolol.  7.Pain. Tylenol is available.  8. Disposition. He will be discharged back with his mother. He will follow up with his regular psychiatrist  11/ 18 Ronnie Keith is already on mirtazapine 30 mg and also on Restoril 30 mg. He continues to state he is not sleeping well. For now we'll not make any changes.  I will recommend to the primary team to consider Seroquel instead of Abilify.  11/19 Ronnie Keith appears depressed this morning. He reports his having passive suicidal ideation as sometimes he just wants to quit his sleep and never wake Up. He continues to state he is not sleeping well at night. He feels emotionally and physically "bad" .  Gabapentin will be discontinued today as per her pharmacy and there are concerns with his renal clearance.   Hildred Priest, MD 07/21/2016, 3:16 PM

## 2016-07-21 NOTE — BHH Counselor (Signed)
Adult Comprehensive Assessment  Patient ID: Ronnie Keith, male   DOB: 01/03/1961, 55 y.o.   MRN: [id number]  Information Source: Information source: Patient  Current Stressors:  Educational / Learning stressors: n/a Employment / Job issues: Pt is on disability. Family Relationships: n/a Museum/gallery curator / Lack of resources (include bankruptcy): n/a Housing / Lack of housing: Pt is living with mother. Physical health (include injuries & life threatening diseases): Chronic Kidney Disease Social relationships: n/a Substance abuse: n/a Bereavement / Loss: n/a  Living/Environment/Situation:  Living Arrangements: Parent, Other relatives Living conditions (as described by patient or guardian): Pt states that it is fine. How long has patient lived in current situation?: His whole life What is atmosphere in current home: Comfortable, Loving, Supportive  Family History:  Marital status: Single Are you sexually active?: No What is your sexual orientation?: Pt did not answer Has your sexual activity been affected by drugs, alcohol, medication, or emotional stress?: n/a Does patient have children?: No  Childhood History:  By whom was/is the patient raised?: Both parents Description of patient's relationship with caregiver when they were a child: Pt states his relationship with his parents were pretty good.  Patient's description of current relationship with people who raised him/her: Pt father is deceased. Pt has a good relationship with his mother.  How were you disciplined when you got in trouble as a child/adolescent?: n/a Does patient have siblings?: Yes Number of Siblings: 1 Description of patient's current relationship with siblings: 1 sister. She lives with patient and his mother in the home.  Did patient suffer any verbal/emotional/physical/sexual abuse as a child?: No Did patient suffer from severe childhood neglect?: No Has patient ever been sexually abused/assaulted/raped as an  adolescent or adult?: No Was the patient ever a victim of a crime or a disaster?: No Witnessed domestic violence?: No Has patient been effected by domestic violence as an adult?: No  Education:  Highest grade of school patient has completed: High school diploma Currently a student?: No Name of school: n/a Learning disability?: Yes What learning problems does patient have?: Pt states he had special classes for learning.   Employment/Work Situation:   Employment situation: On disability Why is patient on disability: Medical reasons How long has patient been on disability: less than a year Patient's job has been impacted by current illness: No What is the longest time patient has a held a job?: 19 years Where was the patient employed at that time?: [employer] in the printing/mail room Has patient ever been in the [employer]?: No Has patient ever served in combat?: No Did You Receive Any Psychiatric Treatment/Services While in Passenger transport manager?: No Are There Guns or Other Weapons in [place]?: Yes Types of Guns/Weapons: Pt is unsure. Are These Weapons Safely Secured?:  (CSW will verify with patients mother. )  Financial Resources:   Financial resources: Teacher, early years/pre, [employer], Support from parents / caregiver Does patient have a Programmer, applications or guardian?: No  Alcohol/Substance Abuse:   What has been your use of drugs/alcohol within the last 12 months?: Patient denies If attempted suicide, did drugs/alcohol play a role in this?: No Has alcohol/substance abuse ever caused legal problems?: No  Social Support System:   Pensions consultant Support System: Good Describe Community Support System: Mother and sister Type of faith/religion: Jehovah Witness How does patient's faith help to cope with current illness?: n/a  Leisure/Recreation:   Leisure and Hobbies: Watching tv  Strengths/Needs:   What things does the patient do well?: cooking and  communication In what areas does  patient struggle / problems for patient: depression, isolation, coping with kidney disease  Discharge Plan:   Does patient have access to transportation?: Yes (Pt states his mother could pick him up. ) Will patient be returning to same living situation after discharge?: Yes Currently receiving community mental health services: No If no, would patient like referral for services when discharged?:  ([place]) Does patient have financial barriers related to discharge medications?: No  Summary/Recommendations:   Patient is a 55 year old male admitted voluntarily with a diagnosis of Major depressive disorder, recurrent severe without psychotic features and chronic kidney disease. Information was obtained from psychosocial assessment completed with patient and chart review conducted by this evaluator. Patient presented to the hospital voicing SI during his dialysis treatment. Patient reports primary triggers for admission were his inability to cope with his kidney disease and stating "I feel like people don't generally like being around me". Patient presented as very depressed stating "Sometimes I wish I could go to sleep and not wake up". Patient currently resides with his mother and sister in [place]. CSW will refer patient to outpatient services for medication management and outpatient therapy. Patient will benefit from crisis stabilization, medication evaluation, group therapy and psycho education in addition to case management for discharge. At discharge, it is recommended that patient remain compliant with established discharge plan and continued treatment.   Ronnie Keith G. Claybon Jabs MSW, [facility] 07/21/2016 10:53 AM

## 2016-07-21 NOTE — Progress Notes (Signed)
Patient has been in room and day area most of the day. Has been watching tv. Interacting with peers more today, opened up in group about depression. Stated to writer this a.m. "I just want to be alone in the dark, go to sleep, and never wake up". Has been alone the last 5 to 6 years. Gave his advice on how to cope and therapies to help with the depression. When asked about SI and why he is depressed, states "I don"t know" and 'I am not sure". Does contract for safety, not suicidal or have any type of plan. No HI or AVH. Safety maintained with Q15 minute checks. Support offered. Will continue to monitor.

## 2016-07-21 NOTE — Progress Notes (Signed)
Ronnie Keith visible in the milieu during the evening, no complaints or distress noted on shift. He came to the medication room for meds and was complaint. He remains sad and depressed , he appeared to sleep well through out the night.

## 2016-07-22 MED ORDER — BUPROPION HCL ER (XL) 150 MG PO TB24
150.0000 mg | ORAL_TABLET | Freq: Every day | ORAL | Status: DC
  Administered 2016-07-22 – 2016-07-26 (×5): 150 mg via ORAL
  Filled 2016-07-22 (×6): qty 1

## 2016-07-22 MED ORDER — VENLAFAXINE HCL ER 75 MG PO CP24: 75.0000 mg | ORAL_CAPSULE | Freq: Every day | ORAL | Status: DC

## 2016-07-22 MED ORDER — ARIPIPRAZOLE 2 MG PO TABS: 2.0000 mg | ORAL_TABLET | Freq: Every day | ORAL | Status: DC

## 2016-07-22 MED ORDER — QUETIAPINE FUMARATE 25 MG PO TABS
50.0000 mg | ORAL_TABLET | Freq: Every day | ORAL | Status: DC
  Administered 2016-07-22: 50 mg via ORAL
  Filled 2016-07-22: qty 2

## 2016-07-22 MED ORDER — MIRTAZAPINE 15 MG PO TABS
15.0000 mg | ORAL_TABLET | Freq: Every day | ORAL | Status: DC
  Administered 2016-07-22 – 2016-07-23 (×2): 15 mg via ORAL
  Filled 2016-07-22 (×2): qty 1

## 2016-07-22 NOTE — Progress Notes (Signed)
D: Patient alert and oriented x 3. Patient denies SI/HI/AVH. Patient states he is really worried about being discharged and having no where to go. This writer reassured patient that he would have somewhere to go by the time he is discharged.  A: Staff to monitor Q 15 mins for safety. Encouragement and support offered. Scheduled medications administered per orders. R: Patient remains safe on the unit. Patient attended group tonight. Patient visible on hte unit and interacting with peers. Patient taking administered medications.

## 2016-07-22 NOTE — Progress Notes (Signed)
Patient has been in bed sleeping. Medications were taken to him in room. Reports that he just needs some rest. No sign of distress while asleep. Staff continue to monitor.

## 2016-07-22 NOTE — BHH Group Notes (Signed)
[facility] LCSW Group Therapy Note  Date/Time: 07/22/2016 1:00pm  Type of Therapy and Topic:  Group Therapy:  Overcoming Obstacles  Participation Level:  Minimal  Description of Group:    In this group patients will be encouraged to explore what they see as obstacles to their own wellness and recovery. They will be guided to discuss their thoughts, feelings, and behaviors related to these obstacles. The group will process together ways to cope with barriers, with attention given to specific choices patients can make. Each patient will be challenged to identify changes they are motivated to make in order to overcome their obstacles. This group will be process-oriented, with patients participating in exploration of their own experiences as well as giving and receiving support and challenge from other group members.  Therapeutic Goals: 1. Patient will identify personal and current obstacles as they relate to admission. 2. Patient will identify barriers that currently interfere with their wellness or overcoming obstacles.  3. Patient will identify feelings, thought process and behaviors related to these barriers. 4. Patient will identify two changes they are willing to make to overcome these obstacles:    Summary of Patient Progress  Pt participated with direct questioning and encouragement.  He is able to meet therapeutic goals.  He identified a limited support system and not taking his medications consistently as the main barriers to his wellness.   He verbalizes some potential ways to change these circumstances but admits that he would find it difficult to expand    Therapeutic Modalities:   Cognitive Behavioral Therapy Solution Focused Therapy Motivational Interviewing Relapse Prevention Therapy  Dossie Arbour, MSW, LCSW

## 2016-07-22 NOTE — Progress Notes (Signed)
Recreation Therapy Notes  Date: 11.20.17 Time: 9:30 am Location: Craft Room  Group Topic: Self-expression  Goal Area(s) Addresses:  Patient will be able to identify a color that represents each emotion. Patient will verbalize benefit of using art as a means of self-expression. Patient will verbalize one emotion experienced while participating in activity.  Behavioral Response: Attentive   Intervention: The Colors Within Me  Activity: Patients were given a blank face worksheet and were instructed to pick a color for each emotion they were feeling and show on the face how much of that emotion they are feeling.  Education: LRT educated patients on other forms of self-expression.  Education Outcome: Acknowledges education/In group clarification offered  Clinical Observations/Feedback: Patient picked a color for each emotion he was feeling. Patient worked on Therapist, sports. Patient did not contribute to group discussion.  Leonette Monarch, LRT/CTRS 07/22/2016 10:23 AM

## 2016-07-22 NOTE — Plan of Care (Signed)
Problem: Medication: Goal: Compliance with prescribed medication regimen will improve Outcome: Progressing Medications taken to him in room per his request. Taking medications voluntarily

## 2016-07-22 NOTE — Progress Notes (Addendum)
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 He was admitted for voicing suicidal ideation at the dialysis Center. He was consulted by nephrology and continues dialysis here.   Ronnie Keith complains of "not feeling well" and is asking for energy pill. He has passive suicidal thoughts. Reportedly, he was feeling slightly better over the weekend but today he is in bed again, hard to awaiken and not able to participate in conversation. Reports good sleep at night. No side effects from medications.  I will lower Remeron, move Abilify to night time, and start Wellbutrin.  Per nursing:  Patient has been in room and day area most of the day. Has been watching tv. Interacting with peers more today, opened up in group about depression. Stated to writer this a.m. "I just want to be alone in the dark, go to sleep, and never wake up". Has been alone the last 5 to 6 years. Gave his advice on how to cope and therapies to help with the depression. When asked about SI and why he is depressed, states "I don"t know" and 'I am not sure". Does contract for safety, not suicidal or have any type of plan. No HI or AVH. Safety maintained with Q15 minute checks. Support offered. Will continue to monitor.   Principal Problem: Major depressive disorder, recurrent severe without psychotic features ([place]) Diagnosis:   Patient Active Problem List   Diagnosis Date Noted  . Chronic kidney disease [N18.9] 07/17/2016  . Intellectual disability [F79] 07/16/2016  . Major depressive disorder, recurrent severe without psychotic features ([place]) [F33.2] 09/28/2015  . Hyperkalemia [E87.5] 09/20/2015   Total Time spent with patient: 20 minutes  Past Psychiatric History: Depression.  Past Medical History:  Past Medical History:  Diagnosis Date  . Anxiety   .  Chronic kidney disease   . Chronic low back pain   . Hypertension   . Sciatica   . Scoliosis     Past Surgical History:  Procedure Laterality Date  . CLEFT PALATE REPAIR    . EYE SURGERY     "when I was small"  . PERIPHERAL VASCULAR CATHETERIZATION N/A 09/29/2015   Procedure: Dialysis/Perma Catheter Insertion;  Surgeon: Katha Cabal, MD;  Location: [place] [facility];  Service: Cardiovascular;  Laterality: N/A;  . ROTATOR CUFF REPAIR Left    Family History:  Family History  Problem Relation Age of Onset  . Hypertension Other    Family Psychiatric  History: See H&P.  Social History:  History  Alcohol Use No     History  Drug Use No    Social History   Social History  . Marital status: Single    Spouse name: N/A  . Number of children: N/A  . Years of education: N/A   Social History Main Topics  . Smoking status: Never Smoker  . Smokeless tobacco: Never Used  . Alcohol use No  . Drug use: No  . Sexual activity: Not Currently   Other Topics Concern  . None   Social History Narrative  . None   Additional Social History:    Pain Medications: see PTA meds Prescriptions: see PTA meds Over the Counter: see PTA meds History of alcohol / drug use?: No history of alcohol / drug abuse  Sleep: Fair  Appetite:  Fair  Current Medications: Current Facility-Administered Medications  Medication Dose Route Frequency Provider Last Rate Last Dose  . acetaminophen (TYLENOL) tablet 650 mg  650 mg Oral Q6H PRN Gonzella Lex, MD   650 mg at 07/17/16 0048  . alum & mag hydroxide-simeth (MAALOX/MYLANTA) 200-200-20 MG/5ML suspension 30 mL  30 mL Oral Q4H PRN Gonzella Lex, MD      . Derrill Memo ON 07/23/2016] ARIPiprazole (ABILIFY) tablet 2 mg  2 mg Oral QHS Jaivian Battaglini B Carissa Musick, MD      . aspirin chewable tablet 81 mg  81 mg Oral Daily Tevin Shillingford B Raedyn Wenke, MD   81 mg at 07/22/16 0833  . buPROPion (WELLBUTRIN XL) 24 hr tablet 150 mg  150 mg Oral  Daily Jeanpaul Biehl B Hartford Maulden, MD      . calcium acetate (PHOSLO) capsule 1,334 mg  1,334 mg Oral TID WC Lavonia Dana, MD   1,334 mg at 07/22/16 0832  . lidocaine-prilocaine (EMLA) cream   Topical Once Lavonia Dana, MD      . magnesium hydroxide (MILK OF MAGNESIA) suspension 30 mL  30 mL Oral Daily PRN Gonzella Lex, MD      . metoprolol succinate (TOPROL-XL) 24 hr tablet 50 mg  50 mg Oral Daily Gonzella Lex, MD   50 mg at 07/22/16 0833  . mirtazapine (REMERON) tablet 15 mg  15 mg Oral QHS Machaela Caterino B Virgen Belland, MD      . pantoprazole (PROTONIX) EC tablet 40 mg  40 mg Oral Daily Gonzella Lex, MD   40 mg at 07/22/16 0833  . senna-docusate (Senokot-S) tablet 1 tablet  1 tablet Oral QHS Clovis Fredrickson, MD   1 tablet at 07/21/16 2205  . temazepam (RESTORIL) capsule 30 mg  30 mg Oral QHS Deakon Frix B Diedra Sinor, MD   30 mg at 07/21/16 2205    Lab Results:  No results found for this or any previous visit (from the past 65 hour(s)).  Blood Alcohol level:  Lab Results  Component Value Date   ETH <5 62/94/7654    Metabolic Disorder Labs: Lab Results  Component Value Date   HGBA1C 5.2 07/16/2016   MPG 103 07/16/2016   No results found for: PROLACTIN No results found for: CHOL, TRIG, HDL, CHOLHDL, VLDL, LDLCALC  Physical Findings: AIMS: Facial and Oral Movements Muscles of Facial Expression: None, normal Lips and Perioral Area: None, normal Jaw: None, normal Tongue: None, normal,Extremity Movements Upper (arms, wrists, hands, fingers): None, normal Lower (legs, knees, ankles, toes): None, normal, Trunk Movements Neck, shoulders, hips: None, normal, Overall Severity Severity of abnormal movements (highest score from questions above): None, normal Incapacitation due to abnormal movements: None, normal Patient's awareness of abnormal movements (rate only patient's report): No Awareness, Dental Status Current problems with teeth and/or dentures?: No Does patient usually wear  dentures?: No  CIWA:    COWS:     Musculoskeletal: Strength & Muscle Tone: within normal limits Gait & Station: normal Patient leans: N/A  Psychiatric Specialty Exam: Physical Exam  Nursing note and vitals reviewed.   Review of Systems  Constitutional: Positive for malaise/fatigue.  Neurological: Positive for weakness.  Psychiatric/Behavioral: Positive for depression and suicidal ideas.    Blood pressure 121/75, pulse 79, temperature 97.7 F (36.5 C), temperature source Oral, resp. rate 16, height 6\' 1"  (1.854 m), weight 115 kg (253 lb 8.5 oz), SpO2 100 %.Body mass index is 33.45 kg/m.  General Appearance: Casual  Eye Contact:  Good  Speech:  Clear and Coherent  Volume:  Decreased  Mood:  Depressed and Hopeless  Affect:  Blunt  Thought Process:  Goal Directed and Descriptions of Associations: Intact  Orientation:  Full (Time, Place, and Person)  Thought Content:  WDL  Suicidal Thoughts:  Yes.  with intent/plan  Homicidal Thoughts:  No  Memory:  Immediate;   Fair Recent;   Fair Remote;   Fair  Judgement:  Impaired  Insight:  Shallow  Psychomotor Activity:  Psychomotor Retardation  Concentration:  Concentration: Fair and Attention Span: Fair  Recall:  [employer] of Knowledge:  Fair  Language:  Fair  Akathisia:  No  Handed:  Right  AIMS (if indicated):     Assets:  Communication Skills Desire for Improvement Financial Resources/Insurance Housing Resilience Social Support  ADL's:  Intact  Cognition:  WNL  Sleep:  Number of Hours: 6     Treatment Plan Summary: Daily contact with patient to assess and evaluate symptoms and progress in treatment and Medication management   Ronnie Keith is a 55 year old male with a history of developmental disability, depression, and chronic kidney disease on dialysis admitted for suicidal ideation.  1. Suicidal ideation. The patient is able to contract for safety in the hospital.  2. Mood. We will lower Remeron to 15 mg nightly  and start Wellbutrin in the morning for depression. We switch to Seroquel for augmentation.  3. Insomnia. He did not respond to Trazodone, sleeps better with Restoril.     4. GERD. He is on Protonix.  5. Chronic kidney disease. Nephrology consult is greatly appreciated. The patient will continue dialysis.  6. Hypertension. He is on metoprolol.  7.Pain. Tylenol is available.  8. Disposition. He will be discharged back with his mother. He will follow up with his regular psychiatrist    Orson Slick, MD 07/22/2016, 11:37 AM

## 2016-07-22 NOTE — BHH Group Notes (Signed)
[facility] Group Notes:  (Nursing/MHT/Case Management/Adjunct)  Date:  07/22/2016  Time:  4:47 AM  Type of Therapy:  Group Therapy  Participation Level:  Active  Participation Quality:  Appropriate  Affect:  Appropriate  Cognitive:  Appropriate  Insight:  Appropriate  Engagement in Group:  Engaged  Modes of Intervention:  n/a  Summary of Progress/Problems:  Ronnie Keith 07/22/2016, 4:47 AM

## 2016-07-22 NOTE — Plan of Care (Signed)
Problem: Safety: Goal: Ability to disclose and discuss suicidal ideas will improve Outcome: Progressing Patient able to verbalize his feelings and concerns: denies DI/HI

## 2016-07-22 NOTE — Progress Notes (Signed)
D:  Pt is flat/depressed during interaction, denies SI/HI/AVH presently and contracts for safety, uses walker to ambulate, no complaints at this time, going to groups and activities, ate breakfast this morning, took his morning medications.    A:  Emotional support provided, Encouraged pt to continue with treatment plan and attend all group activities, q15 min checks maintained for safety.  R:  Pt is receptive, going to groups, pleasant and cooperative with staff and other patients on the unit.

## 2016-07-23 LAB — CBC
HEMATOCRIT: 31.1 % — AB (ref 40.0–52.0)
HEMOGLOBIN: 10.8 g/dL — AB (ref 13.0–18.0)
MCH: 32 pg (ref 26.0–34.0)
MCHC: 34.7 g/dL (ref 32.0–36.0)
MCV: 92.2 fL (ref 80.0–100.0)
Platelets: 103 10*3/uL — ABNORMAL LOW (ref 150–440)
RBC: 3.37 MIL/uL — AB (ref 4.40–5.90)
RDW: 15.4 % — ABNORMAL HIGH (ref 11.5–14.5)
WBC: 5.7 10*3/uL (ref 3.8–10.6)

## 2016-07-23 LAB — RENAL FUNCTION PANEL
ANION GAP: 11 (ref 5–15)
Albumin: 3.9 g/dL (ref 3.5–5.0)
BUN: 88 mg/dL — ABNORMAL HIGH (ref 6–20)
CHLORIDE: 102 mmol/L (ref 101–111)
CO2: 24 mmol/L (ref 22–32)
Calcium: 8.5 mg/dL — ABNORMAL LOW (ref 8.9–10.3)
Creatinine, Ser: 10.99 mg/dL — ABNORMAL HIGH (ref 0.61–1.24)
GFR calc non Af Amer: 5 mL/min — ABNORMAL LOW (ref >60)
GFR, EST AFRICAN AMERICAN: 5 mL/min — AB (ref >60)
Glucose, Bld: 123 mg/dL — ABNORMAL HIGH (ref 65–99)
POTASSIUM: 5.4 mmol/L — AB (ref 3.5–5.1)
Phosphorus: 5.3 mg/dL — ABNORMAL HIGH (ref 2.5–4.6)
Sodium: 137 mmol/L (ref 135–145)

## 2016-07-23 MED ORDER — QUETIAPINE FUMARATE 100 MG PO TABS
100.0000 mg | ORAL_TABLET | Freq: Every day | ORAL | Status: DC
  Administered 2016-07-23: 100 mg via ORAL
  Filled 2016-07-23: qty 1

## 2016-07-23 MED ORDER — TRAMADOL HCL 50 MG PO TABS
50.0000 mg | ORAL_TABLET | Freq: Two times a day (BID) | ORAL | Status: DC | PRN
  Filled 2016-07-23: qty 1

## 2016-07-23 MED ORDER — TRAMADOL HCL 50 MG PO TABS
50.0000 mg | ORAL_TABLET | Freq: Four times a day (QID) | ORAL | Status: DC | PRN
  Administered 2016-07-23 – 2016-07-28 (×5): 50 mg via ORAL
  Filled 2016-07-23 (×5): qty 1

## 2016-07-23 MED ORDER — TRAMADOL HCL 50 MG PO TABS
50.0000 mg | ORAL_TABLET | Freq: Four times a day (QID) | ORAL | Status: DC
  Administered 2016-07-23: 50 mg via ORAL

## 2016-07-23 MED ORDER — MIDODRINE HCL 5 MG PO TABS
10.0000 mg | ORAL_TABLET | ORAL | Status: DC
  Administered 2016-07-25 – 2016-07-27 (×2): 10 mg via ORAL
  Filled 2016-07-23 (×2): qty 2

## 2016-07-23 NOTE — BHH Group Notes (Signed)
[facility] Group Notes:  (Nursing/MHT/Case Management/Adjunct)  Date:  07/23/2016  Time:  4:07 AM  Type of Therapy:  Psychoeducational Skills  Participation Level:  Active  Participation Quality:  Appropriate and Sharing  Affect:  Appropriate  Cognitive:  Appropriate  Insight:  Appropriate and Good  Engagement in Group:  Engaged  Modes of Intervention:  Discussion, Socialization and Support  Summary of Progress/Problems:  Reece Agar 07/23/2016, 4:07 AM

## 2016-07-23 NOTE — Progress Notes (Signed)
HD initiated without issue. Pt resting comfortably in recliner

## 2016-07-23 NOTE — Progress Notes (Signed)
Recreation Therapy Notes  Date: 11.21.17 Time: 9:30 am Location: Craft Room  Group Topic: Goal Setting  Goal Area(s) Addresses:  Patient will write at least one goal. Patient will write at least one obstacle.  Behavioral Response: Did not attend  Intervention: Recovery Goal Chart  Activity: Patients were instructed to make a Recovery Goal Chart including goals, obstacles, the date they started working on their goals, and the date they achieved their goals.  Education: LRT educated patients on ways to celebrate reaching their goals in a healthy way.  Education Outcome: Patient did not attend group.   Clinical Observations/Feedback: Patient did not attend group.  Leonette Monarch, LRT/CTRS 07/23/2016 10:14 AM

## 2016-07-23 NOTE — Progress Notes (Signed)
Post HD assessment  

## 2016-07-23 NOTE — Progress Notes (Signed)
Patient is pleasant & cooperative in the unit.Denies suicidal or homicidal ideations & AV hallucinations.Patient tolerated dialysis.Compliant with medications.Appropriate with staff & peers.Patient uses walker for ambulation.Support & encouragement given.

## 2016-07-23 NOTE — Progress Notes (Signed)
Pre dialysis assessment 

## 2016-07-23 NOTE — Plan of Care (Signed)
Problem: Coping: Goal: Ability to verbalize frustrations and anger appropriately will improve Outcome: Progressing Patient verbalized feelings to staff.

## 2016-07-23 NOTE — Progress Notes (Signed)
Pre dialysis  

## 2016-07-23 NOTE — Progress Notes (Signed)
[facility]  ROUNDING NOTE   Subjective:   Seen and examined on hemodialysis. Tolerating treatment well. Uf goal of 1.5 litres as tolerated   HEMODIALYSIS FLOWSHEET:  Blood Flow Rate (mL/min): 400 mL/min Arterial Pressure (mmHg): -200 mmHg Venous Pressure (mmHg): 160 mmHg Transmembrane Pressure (mmHg): 40 mmHg Ultrafiltration Rate (mL/min): 470 mL/min Dialysate Flow Rate (mL/min): 600 ml/min Conductivity: Machine : 14.5 Conductivity: Machine : 14.5 Dialysis Fluid Bolus: Normal Saline Bolus Amount (mL): 100 mL Dialysate Change: 2K Intra-Hemodialysis Comments: post HD bp    Objective:  Vital signs in last 24 hours:  Temp:  [97.8 F (36.6 C)-97.9 F (36.6 C)] 97.8 F (36.6 C) (11/21 1237) Pulse Rate:  [70-80] 70 (11/21 1240) Resp:  [10-16] 15 (11/21 1249) BP: (73-122)/(48-77) 102/77 (11/21 1249) SpO2:  [97 %-100 %] 100 % (11/21 1240) Weight:  [118 kg (260 lb 2.3 oz)-119 kg (262 lb 5.6 oz)] 118 kg (260 lb 2.3 oz) (11/21 1237)  Weight change:  Filed Weights   07/18/16 1400 07/23/16 0856 07/23/16 1237  Weight: 115 kg (253 lb 8.5 oz) 119 kg (262 lb 5.6 oz) 118 kg (260 lb 2.3 oz)    Intake/Output: I/O last 3 completed shifts: In: 32 [P.O.:960] Out: -    Intake/Output this shift:  Total I/O In: 360 [P.O.:360] Out: 1000 [Other:1000]  Physical Exam: General: NAD,   Head: Normocephalic, atraumatic. Moist oral mucosal membranes  Eyes: Anicteric,   Neck: Supple, trachea midline  Lungs:  Clear to auscultation  Heart: Regular rate and rhythm  Abdomen:  Soft, nontender,   Extremities: trace peripheral edema.  Neurologic: Nonfocal, moving all four extremities  Skin: No lesions  Access: Left arm AVF    Basic Metabolic Panel:  Recent Labs Lab 07/16/16 1605 07/20/16 1110 07/23/16 0900  NA 140 137 137  K 4.1 4.5 5.4*  CL 99* 100* 102  CO2 33* 28 24  GLUCOSE 89 98 123*  BUN 26* 50* 88*  CREATININE 5.42* 8.58* 10.99*  CALCIUM 8.5* 8.4* 8.5*   PHOS  --  3.8 5.3*    Liver Function Tests:  Recent Labs Lab 07/16/16 1605 07/20/16 1110 07/23/16 0900  AST 19  --   --   ALT 17  --   --   ALKPHOS 137*  --   --   BILITOT 0.4  --   --   PROT 8.1  --   --   ALBUMIN 4.2 3.7 3.9    Recent Labs Lab 07/16/16 1605  LIPASE 44   No results for input(s): AMMONIA in the last 168 hours.  CBC:  Recent Labs Lab 07/16/16 1605 07/18/16 1200 07/20/16 1110 07/23/16 0900  WBC 6.1 4.5 5.0 5.7  HGB 11.9* 11.2* 10.7* 10.8*  HCT 34.4* 33.0* 31.6* 31.1*  MCV 92.7 92.6 92.0 92.2  PLT 129* 105* 105* 103*    Cardiac Enzymes: No results for input(s): CKTOTAL, CKMB, CKMBINDEX, TROPONINI in the last 168 hours.  BNP: Invalid input(s): POCBNP  CBG: No results for input(s): GLUCAP in the last 168 hours.  Microbiology: Results for orders placed or performed during the hospital encounter of 09/19/15  CULTURE, BLOOD (ROUTINE X 2) w Reflex to PCR ID Panel     Status: None   Collection Time: 09/25/15 11:08 AM  Result Value Ref Range Status   Specimen Description BLOOD LEFT HAND  Final   Special Requests BOTTLES DRAWN AEROBIC AND ANAEROBIC [place]  Final   Culture NO GROWTH 5 DAYS  Final   Report Status  09/30/2015 FINAL  Final  CULTURE, BLOOD (ROUTINE X 2) w Reflex to PCR ID Panel     Status: None   Collection Time: 09/25/15  3:41 PM  Result Value Ref Range Status   Specimen Description BLOOD LINE  Final   Special Requests   Final    BOTTLES DRAWN AEROBIC AND ANAEROBIC AERO 8CC ANA 14CC   Culture NO GROWTH 5 DAYS  Final   Report Status 09/30/2015 FINAL  Final  Wound culture     Status: None   Collection Time: 09/26/15  3:02 PM  Result Value Ref Range Status   Specimen Description WOUND  Final   Special Requests Normal  Final   Gram Stain RARE WBC SEEN NO ORGANISMS SEEN   Final   Culture LIGHT GROWTH STAPHYLOCOCCUS AUREUS  Final   Report Status 09/29/2015 FINAL  Final   Organism ID, Bacteria STAPHYLOCOCCUS AUREUS  Final       Susceptibility   Staphylococcus aureus - MIC*    OXACILLIN Value in next row Sensitive      SENSITIVE<=0.25    GENTAMICIN Value in next row Sensitive      SENSITIVE<=0.5    CIPROFLOXACIN Value in next row Sensitive      SENSITIVE<=0.5    ERYTHROMYCIN Value in next row Sensitive      SENSITIVE<=0.25    CLINDAMYCIN Value in next row Sensitive      SENSITIVE<=0.25    VANCOMYCIN Value in next row Sensitive      SENSITIVE1    TETRACYCLINE Value in next row Sensitive      SENSITIVE<=1    RIFAMPIN Value in next row Sensitive      SENSITIVE<=0.5    TRIMETH/SULFA Value in next row Sensitive      SENSITIVE<=10    * LIGHT GROWTH STAPHYLOCOCCUS AUREUS    Coagulation Studies: No results for input(s): LABPROT, INR in the last 72 hours.  Urinalysis: No results for input(s): COLORURINE, LABSPEC, PHURINE, GLUCOSEU, HGBUR, BILIRUBINUR, KETONESUR, PROTEINUR, UROBILINOGEN, NITRITE, LEUKOCYTESUR in the last 72 hours.  Invalid input(s): APPERANCEUR    Imaging: No results found.   Medications:    . aspirin  81 mg Oral Daily  . buPROPion  150 mg Oral Daily  . calcium acetate  1,334 mg Oral TID WC  . lidocaine-prilocaine   Topical Once  . metoprolol succinate  50 mg Oral Daily  . mirtazapine  15 mg Oral QHS  . pantoprazole  40 mg Oral Daily  . QUEtiapine  100 mg Oral QHS  . senna-docusate  1 tablet Oral QHS   acetaminophen, alum & mag hydroxide-simeth, magnesium hydroxide, traMADol  Assessment/ Plan:  Mr. Ronnie MACLAUGHLIN is a 55 y.o. white male with past medical history of developmental deficit, hypertension, chronic low back pain, scoliosis, cardiac ablation for PSVT, ESRD February 2017.   TTS [facility] [place]  1. End Stage Renal Disease: AVF Seen and examined on dialysis TTS schedule  2. Hypertension: Blood pressure at goal.  - metoprolol - gets midodrine before dialysis treatments  3. Anemia of chronic kidney disease:  - Mircera as outpatient. No indication for epo  4.  Secondary Hyperparathyroidism: outpatient PTH elevated 774. On 07/02/16. Calcium and phosphorus at goal.  -  calcium acetate with meals.    LOS: 6 Ronnie Keith 11/21/20172:41 PM

## 2016-07-23 NOTE — Tx Team (Signed)
Interdisciplinary Treatment and Diagnostic Plan Update  07/23/2016 Time of Session: 10:30am Ronnie Keith MRN: [id number]  Principal Diagnosis: Major depressive disorder, recurrent severe without psychotic features ([place])  Secondary Diagnoses: Principal Problem:   Major depressive disorder, recurrent severe without psychotic features ([place]) Active Problems:   Chronic kidney disease   Current Medications:  Current Facility-Administered Medications  Medication Dose Route Frequency Provider Last Rate Last Dose  . acetaminophen (TYLENOL) tablet 650 mg  650 mg Oral Q6H PRN Gonzella Lex, MD   650 mg at 07/22/16 2302  . alum & mag hydroxide-simeth (MAALOX/MYLANTA) 200-200-20 MG/5ML suspension 30 mL  30 mL Oral Q4H PRN Gonzella Lex, MD      . aspirin chewable tablet 81 mg  81 mg Oral Daily Jolanta B Pucilowska, MD   81 mg at 07/22/16 0833  . buPROPion (WELLBUTRIN XL) 24 hr tablet 150 mg  150 mg Oral Daily Jolanta B Pucilowska, MD   150 mg at 07/22/16 1400  . calcium acetate (PHOSLO) capsule 1,334 mg  1,334 mg Oral TID WC Lavonia Dana, MD   1,334 mg at 07/22/16 1740  . lidocaine-prilocaine (EMLA) cream   Topical Once Lavonia Dana, MD      . magnesium hydroxide (MILK OF MAGNESIA) suspension 30 mL  30 mL Oral Daily PRN Gonzella Lex, MD      . metoprolol succinate (TOPROL-XL) 24 hr tablet 50 mg  50 mg Oral Daily Gonzella Lex, MD   50 mg at 07/22/16 0833  . mirtazapine (REMERON) tablet 15 mg  15 mg Oral QHS Clovis Fredrickson, MD   15 mg at 07/22/16 2259  . pantoprazole (PROTONIX) EC tablet 40 mg  40 mg Oral Daily Gonzella Lex, MD   40 mg at 07/22/16 7353  . QUEtiapine (SEROQUEL) tablet 50 mg  50 mg Oral QHS Jolanta B Pucilowska, MD   50 mg at 07/22/16 2259  . senna-docusate (Senokot-S) tablet 1 tablet  1 tablet Oral QHS Clovis Fredrickson, MD   1 tablet at 07/22/16 2259  . traMADol (ULTRAM) tablet 50 mg  50 mg Oral Q12H PRN Clovis Fredrickson, MD       PTA  Medications: Prescriptions Prior to Admission  Medication Sig Dispense Refill Last Dose  . acetaminophen (TYLENOL) 325 MG tablet Take 650 mg by mouth every 6 (six) hours as needed for mild pain or moderate pain.   Past Week at Unknown time  . aspirin EC 81 MG tablet Take 81 mg by mouth daily.   07/16/2016 at Unknown time  . fluticasone (FLONASE) 50 MCG/ACT nasal spray Place 1 spray into both nostrils daily.   07/16/2016 at Unknown time  . gabapentin (NEURONTIN) 300 MG capsule Take 1 capsule (300 mg total) by mouth 2 (two) times daily. 60 capsule 2 07/16/2016 at Unknown time  . MELATONIN PO Take 1 tablet by mouth daily.   07/16/2016 at Unknown time  . metoprolol succinate (TOPROL-XL) 50 MG 24 hr tablet Take 50 mg by mouth daily.   07/16/2016 at Unknown time  . mirtazapine (REMERON) 30 MG tablet Take 1 tablet (30 mg total) by mouth at bedtime. 30 tablet 2 07/16/2016 at Unknown time  . omeprazole (PRILOSEC) 40 MG capsule Take 40 mg by mouth daily.   07/16/2016 at Unknown time  . oxyCODONE (OXY IR/ROXICODONE) 5 MG immediate release tablet Take 1 tablet (5 mg total) by mouth every 6 (six) hours as needed for moderate pain. 20 tablet 0 Past Week at  Unknown time  . senna-docusate (SENOKOT-S) 8.6-50 MG tablet Take 2 tablets by mouth 2 (two) times daily. 60 tablet 2 Past Month at Unknown time  . ALPRAZolam (XANAX) 1 MG tablet Take 1 tablet (1 mg total) by mouth 2 (two) times daily as needed for anxiety. (Patient not taking: Reported on 07/17/2016) 20 tablet 0 Not Taking at Unknown time    Patient Stressors: Health problems Occupational concerns  Patient Strengths: Armed forces logistics/support/administrative officer Supportive family/friends  Treatment Modalities: Medication Management, Group therapy, Case management,  1 to 1 session with clinician, Psychoeducation, Recreational therapy.   Physician Treatment Plan for Primary Diagnosis: Major depressive disorder, recurrent severe without psychotic features ([place]) Long Term  Goal(s): Improvement in symptoms so as ready for discharge NA   Short Term Goals: Ability to identify changes in lifestyle to reduce recurrence of condition will improve Ability to verbalize feelings will improve Ability to disclose and discuss suicidal ideas Ability to demonstrate self-control will improve Ability to identify and develop effective coping behaviors will improve Ability to maintain clinical measurements within normal limits will improve Compliance with prescribed medications will improve NA  Medication Management: Evaluate patient's response, side effects, and tolerance of medication regimen.  Therapeutic Interventions: 1 to 1 sessions, Unit Group sessions and Medication administration.  Evaluation of Outcomes: Progressing  Physician Treatment Plan for Secondary Diagnosis: Principal Problem:   Major depressive disorder, recurrent severe without psychotic features ([place]) Active Problems:   Chronic kidney disease  Long Term Goal(s): Improvement in symptoms so as ready for discharge NA   Short Term Goals: Ability to identify changes in lifestyle to reduce recurrence of condition will improve Ability to verbalize feelings will improve Ability to disclose and discuss suicidal ideas Ability to demonstrate self-control will improve Ability to identify and develop effective coping behaviors will improve Ability to maintain clinical measurements within normal limits will improve Compliance with prescribed medications will improve NA     Medication Management: Evaluate patient's response, side effects, and tolerance of medication regimen.  Therapeutic Interventions: 1 to 1 sessions, Unit Group sessions and Medication administration.  Evaluation of Outcomes: Progressing   RN Treatment Plan for Primary Diagnosis: Major depressive disorder, recurrent severe without psychotic features ([place]) Long Term Goal(s): Knowledge of disease and therapeutic regimen to maintain health  will improve  Short Term Goals: Ability to remain free from injury will improve, Ability to verbalize frustration and anger appropriately will improve, Ability to participate in decision making will improve, Ability to verbalize feelings will improve and Ability to identify and develop effective coping behaviors will improve  Medication Management: RN will administer medications as ordered by provider, will assess and evaluate patient's response and provide education to patient for prescribed medication. RN will report any adverse and/or side effects to prescribing provider.  Therapeutic Interventions: 1 on 1 counseling sessions, Psychoeducation, Medication administration, Evaluate responses to treatment, Monitor vital signs and CBGs as ordered, Perform/monitor CIWA, COWS, AIMS and Fall Risk screenings as ordered, Perform wound care treatments as ordered.  Evaluation of Outcomes: Progressing   LCSW Treatment Plan for Primary Diagnosis: Major depressive disorder, recurrent severe without psychotic features ([place]) Long Term Goal(s): Safe transition to appropriate next level of care at discharge, Engage patient in therapeutic group addressing interpersonal concerns.  Short Term Goals: Engage patient in aftercare planning with referrals and resources, Increase social support, Increase ability to appropriately verbalize feelings, Increase emotional regulation and Facilitate acceptance of mental health diagnosis and concerns  Therapeutic Interventions: Assess for all discharge needs, 1 to  1 time with Education officer, museum, Explore available resources and support systems, Assess for adequacy in community support network, Educate family and significant other(s) on suicide prevention, Complete Psychosocial Assessment, Interpersonal group therapy.  Evaluation of Outcomes: Progressing   Progress in Treatment: Attending groups: Yes, Intermittently Participating in groups: Intermittently Taking medication as  prescribed: No. Toleration medication: No. Family/Significant other contact made: No, will contact:  CSW assessing for appropriate contacts   Patient understands diagnosis: Yes. Discussing patient identified problems/goals with staff: Yes. Medical problems stabilized or resolved: Yes. Denies suicidal/homicidal ideation: Yes. Issues/concerns per patient self-inventory: No. Other: none reported   New problem(s) identified: No, Describe:  none reported  New Short Term/Long Term Goal(s): None reported  Discharge Plan or Barriers: CSW still assessing for appropriate referrals  Reason for Continuation of Hospitalization: Anxiety Depression  Estimated Length of Stay: 3-5 days  Attendees: Patient: Pt was undergoing ECT and unable to attend tx team 07/23/2016 10:56 AM  Physician: Dr. Bary Leriche, MD 07/23/2016 10:56 AM  Nursing: Polly Cobia, RN 07/23/2016 10:56 AM  RN Care Manager: 07/23/2016 10:56 AM  Social Worker: Alphonse Guild. Daine Gravel, LCAS 07/23/2016 10:56 AM  Recreational Therapist: Everitt Amber, LRT 07/23/2016 10:56 AM  Social Worker:  07/23/2016 10:56 AM  Other:  07/23/2016 10:56 AM  Other: 07/23/2016 10:56 AM    Scribe for Treatment Team: Alphonse Guild Tashera Montalvo, LCSWA 07/23/2016

## 2016-07-23 NOTE — Progress Notes (Signed)
HD completed without issue. UF 1L d/t bp drop early in treatment. MD aware.

## 2016-07-23 NOTE — Progress Notes (Addendum)
[facility] MD Progress Note  07/23/2016 11:40 AM Ronnie Keith  MRN:  [id number]  Subjective:  Ronnie Keith is a 55 year old male with a history of mild developmental disability, depression, and end-stage kidney disease on dialysis. He was admitted for voicing suicidal ideation at the dialysis Center. He was consulted by nephrology and continues dialysis here.   Ronnie Keith was seen in dialysis today. He was unable to participate in treatment team meeting for the second time. Last night I was called in to prescribe pain medication for leg pain. This pain comes and goes involves both legs and is not associated with muscle spasms. The patient felt slightly better with tramadol. His mood is improving slightly but affect is still flat. He did not sleep well last night due to pain. He started coming to groups and is able to participate with benefits. The patient feels very lonely at home. Except for his mother he has no other human contact. She is pessimistic that anybody would want to spend time with him and rejects participation in senior center or day program.   Principal Problem: Major depressive disorder, recurrent severe without psychotic features ([place]) Diagnosis:   Patient Active Problem List   Diagnosis Date Noted  . Chronic kidney disease [N18.9] 07/17/2016  . Intellectual disability [F79] 07/16/2016  . Major depressive disorder, recurrent severe without psychotic features ([place]) [F33.2] 09/28/2015  . Hyperkalemia [E87.5] 09/20/2015   Total Time spent with patient: 20 minutes  Past Psychiatric History: Depression.  Past Medical History:  Past Medical History:  Diagnosis Date  . Anxiety   . Chronic kidney disease   . Chronic low back pain   . Hypertension   . Sciatica   . Scoliosis     Past Surgical History:  Procedure Laterality Date  . CLEFT PALATE REPAIR    . EYE SURGERY     "when I was small"  . PERIPHERAL VASCULAR CATHETERIZATION N/A 09/29/2015   Procedure: Dialysis/Perma Catheter  Insertion;  Surgeon: Katha Cabal, MD;  Location: [place] [facility];  Service: Cardiovascular;  Laterality: N/A;  . ROTATOR CUFF REPAIR Left    Family History:  Family History  Problem Relation Age of Onset  . Hypertension Other    Family Psychiatric  History: See H&P.  Social History:  History  Alcohol Use No     History  Drug Use No    Social History   Social History  . Marital status: Single    Spouse name: N/A  . Number of children: N/A  . Years of education: N/A   Social History Main Topics  . Smoking status: Never Smoker  . Smokeless tobacco: Never Used  . Alcohol use No  . Drug use: No  . Sexual activity: Not Currently   Other Topics Concern  . None   Social History Narrative  . None   Additional Social History:    Pain Medications: see PTA meds Prescriptions: see PTA meds Over the Counter: see PTA meds History of alcohol / drug use?: No history of alcohol / drug abuse                    Sleep: Fair  Appetite:  Fair  Current Medications: Current Facility-Administered Medications  Medication Dose Route Frequency Provider Last Rate Last Dose  . acetaminophen (TYLENOL) tablet 650 mg  650 mg Oral Q6H PRN Gonzella Lex, MD   650 mg at 07/22/16 2302  . alum & mag hydroxide-simeth (MAALOX/MYLANTA) 200-200-20 MG/5ML suspension  30 mL  30 mL Oral Q4H PRN Gonzella Lex, MD      . aspirin chewable tablet 81 mg  81 mg Oral Daily Shauntae Reitman B Aissata Wilmore, MD   81 mg at 07/22/16 0833  . buPROPion (WELLBUTRIN XL) 24 hr tablet 150 mg  150 mg Oral Daily Eldridge Marcott B Jesi Jurgens, MD   150 mg at 07/22/16 1400  . calcium acetate (PHOSLO) capsule 1,334 mg  1,334 mg Oral TID WC Lavonia Dana, MD   1,334 mg at 07/22/16 1740  . lidocaine-prilocaine (EMLA) cream   Topical Once Lavonia Dana, MD      . magnesium hydroxide (MILK OF MAGNESIA) suspension 30 mL  30 mL Oral Daily PRN Gonzella Lex, MD      . metoprolol succinate (TOPROL-XL) 24 hr tablet 50 mg  50 mg  Oral Daily Gonzella Lex, MD   50 mg at 07/22/16 0833  . mirtazapine (REMERON) tablet 15 mg  15 mg Oral QHS Clovis Fredrickson, MD   15 mg at 07/22/16 2259  . pantoprazole (PROTONIX) EC tablet 40 mg  40 mg Oral Daily Gonzella Lex, MD   40 mg at 07/22/16 6226  . QUEtiapine (SEROQUEL) tablet 50 mg  50 mg Oral QHS Bader Stubblefield B Alleyne Lac, MD   50 mg at 07/22/16 2259  . senna-docusate (Senokot-S) tablet 1 tablet  1 tablet Oral QHS Clovis Fredrickson, MD   1 tablet at 07/22/16 2259  . traMADol (ULTRAM) tablet 50 mg  50 mg Oral Q12H PRN Clovis Fredrickson, MD        Lab Results:  Results for orders placed or performed during the hospital encounter of 07/17/16 (from the past 48 hour(s))  CBC     Status: Abnormal   Collection Time: 07/23/16  9:00 AM  Result Value Ref Range   WBC 5.7 3.8 - 10.6 K/uL   RBC 3.37 (L) 4.40 - 5.90 MIL/uL   Hemoglobin 10.8 (L) 13.0 - 18.0 g/dL   HCT 31.1 (L) 40.0 - 52.0 %   MCV 92.2 80.0 - 100.0 fL   MCH 32.0 26.0 - 34.0 pg   MCHC 34.7 32.0 - 36.0 g/dL   RDW 15.4 (H) 11.5 - 14.5 %   Platelets 103 (L) 150 - 440 K/uL  Renal function panel     Status: Abnormal   Collection Time: 07/23/16  9:00 AM  Result Value Ref Range   Sodium 137 135 - 145 mmol/L   Potassium 5.4 (H) 3.5 - 5.1 mmol/L   Chloride 102 101 - 111 mmol/L   CO2 24 22 - 32 mmol/L   Glucose, Bld 123 (H) 65 - 99 mg/dL   BUN 88 (H) 6 - 20 mg/dL   Creatinine, Ser 10.99 (H) 0.61 - 1.24 mg/dL   Calcium 8.5 (L) 8.9 - 10.3 mg/dL   Phosphorus 5.3 (H) 2.5 - 4.6 mg/dL   Albumin 3.9 3.5 - 5.0 g/dL   GFR calc non Af Amer 5 (L) >60 mL/min   GFR calc Af Amer 5 (L) >60 mL/min    Comment: (NOTE) The eGFR has been calculated using the CKD EPI equation. This calculation has not been validated in all clinical situations. eGFR's persistently <60 mL/min signify possible Chronic Kidney Disease.    Anion gap 11 5 - 15    Blood Alcohol level:  Lab Results  Component Value Date   ETH <5 33/35/4562     Metabolic Disorder Labs: Lab Results  Component Value Date   HGBA1C  5.2 07/16/2016   MPG 103 07/16/2016   No results found for: PROLACTIN No results found for: CHOL, TRIG, HDL, CHOLHDL, VLDL, LDLCALC  Physical Findings: AIMS: Facial and Oral Movements Muscles of Facial Expression: None, normal Lips and Perioral Area: None, normal Jaw: None, normal Tongue: None, normal,Extremity Movements Upper (arms, wrists, hands, fingers): None, normal Lower (legs, knees, ankles, toes): None, normal, Trunk Movements Neck, shoulders, hips: None, normal, Overall Severity Severity of abnormal movements (highest score from questions above): None, normal Incapacitation due to abnormal movements: None, normal Patient's awareness of abnormal movements (rate only patient's report): No Awareness, Dental Status Current problems with teeth and/or dentures?: No Does patient usually wear dentures?: No  CIWA:    COWS:     Musculoskeletal: Strength & Muscle Tone: within normal limits Gait & Station: normal Patient leans: N/A  Psychiatric Specialty Exam: Physical Exam  Nursing note and vitals reviewed.   Review of Systems  Constitutional: Positive for malaise/fatigue.  Neurological: Positive for weakness.  Psychiatric/Behavioral: Positive for depression and suicidal ideas.    Blood pressure 97/62, pulse 71, temperature 97.9 F (36.6 C), temperature source Oral, resp. rate 12, height '6\' 1"'  (1.854 m), weight 119 kg (262 lb 5.6 oz), SpO2 99 %.Body mass index is 34.61 kg/m.  General Appearance: Casual  Eye Contact:  Good  Speech:  Clear and Coherent  Volume:  Decreased  Mood:  Depressed and Hopeless  Affect:  Blunt  Thought Process:  Goal Directed and Descriptions of Associations: Intact  Orientation:  Full (Time, Place, and Person)  Thought Content:  WDL  Suicidal Thoughts:  Yes.  with intent/plan  Homicidal Thoughts:  No  Memory:  Immediate;   Fair Recent;   Fair Remote;   Fair   Judgement:  Impaired  Insight:  Shallow  Psychomotor Activity:  Psychomotor Retardation  Concentration:  Concentration: Fair and Attention Span: Fair  Recall:  [employer] of Knowledge:  Fair  Language:  Fair  Akathisia:  No  Handed:  Right  AIMS (if indicated):     Assets:  Communication Skills Desire for Improvement Financial Resources/Insurance Housing Resilience Social Support  ADL's:  Intact  Cognition:  WNL  Sleep:  Number of Hours: 6     Treatment Plan Summary: Daily contact with patient to assess and evaluate symptoms and progress in treatment and Medication management   Ronnie Keith is a 55 year old male with a history of developmental disability, depression, and chronic kidney disease on dialysis admitted for suicidal ideation.  1. Suicidal ideation. The patient is able to contract for safety in the hospital.  2. Mood. We lowered Remeron to 15 mg nightly and started Wellbutrin in the morning for depression and started Seroquel for augmentation. Will increase Seroquel to 150 mg to promote sleep.   3. Insomnia. He did not respond to Trazodone or Restoril. He is on Seroquel now.  4. GERD. He is on Protonix.  5. Chronic kidney disease. Nephrology consult is greatly appreciated. The patient will continue dialysis.  6. Hypertension. He is on metoprolol.  7. Leg pain. We started Tramadol. Will consider Requip.  8. Disposition. He will be discharged back with his mother. He will follow up with his regular psychiatrist    Orson Slick, MD 07/23/2016, 11:40 AM

## 2016-07-23 NOTE — Progress Notes (Signed)
D: Pt denies SI/HI/AVH. Pt is pleasant and cooperative. Pt stated appears less anxious and he is interacting with peers and staff appropriately.Patient affect is flat and sad, but brightens upon approach. Patient c/o leg pain intermittently MD on call notified and medication given per order.  A: Pt was offered support and encouragement. Pt was given scheduled medications. Pt was encouraged to attend groups. Q 15 minute checks were done for safety.  R:Pt attends groups and interacts well with peers and staff. Pt is taking medication. Pt has no complaints.Pt receptive to treatment and safety maintained on unit.

## 2016-07-24 MED ORDER — QUETIAPINE FUMARATE 25 MG PO TABS
150.0000 mg | ORAL_TABLET | Freq: Every day | ORAL | Status: DC
  Administered 2016-07-24 – 2016-07-26 (×3): 150 mg via ORAL
  Filled 2016-07-24 (×3): qty 2

## 2016-07-24 MED ORDER — MIRTAZAPINE 15 MG PO TABS
7.5000 mg | ORAL_TABLET | Freq: Every day | ORAL | Status: DC
  Administered 2016-07-24 – 2016-07-25 (×2): 7.5 mg via ORAL
  Filled 2016-07-24 (×2): qty 1

## 2016-07-24 NOTE — Plan of Care (Signed)
Problem: Coping: Goal: Ability to verbalize frustrations and anger appropriately will improve Outcome: Progressing Patient verbalized feelings to staff.

## 2016-07-24 NOTE — Progress Notes (Signed)
D: Pt denies SI/HI/AVH. Pt is pleasant and cooperative, affect is flat, but brightens upon approach, no distress noted Patient  appears less anxious and he is interacting with peers and staff appropriately. Patient thoughts are organized no bizarre behavior noted.   A: Pt was offered support and encouragement. Pt was given scheduled medications. Pt was encouraged to attend groups. Q 15 minute checks were done for safety.  R:Pt attends groups and interacts well with peers and staff. Pt is taking medication. Pt has no complaints.Pt receptive to treatment and safety maintained on unit.

## 2016-07-24 NOTE — Progress Notes (Signed)
[facility] MD Progress Note  07/24/2016 2:14 PM Ronnie Keith  MRN:  [id number]  Subjective:  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 He was admitted for voicing suicidal ideation at the dialysis Center. He was consulted by nephrology and continues dialysis here.   [name] has improved some. He isn't mood is improving and affect is brighter. He is out of his room and in the community on today. He started participating in programming. He slept better last night and feels rested. His leg pain is not as severe. Social worker spoke with his mother at length today. 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 He sleeps all day long at home, stay up all night long to the point that she takes him to urgent care for his visit in the middle of the night. He is very dissatisfied with his dialysis as they are painful and somehow lidocaine gel is not offered. The patient today denies any somatic symptoms. He moves around with a walker. He accepts medications and tolerates them well.   Principal Problem: Major depressive disorder, recurrent severe without psychotic features ([place]) Diagnosis:   Patient Active Problem List   Diagnosis Date Noted  . Chronic kidney disease [N18.9] 07/17/2016  . Intellectual disability [F79] 07/16/2016  . Major depressive disorder, recurrent severe without psychotic features ([place]) [F33.2] 09/28/2015  . Hyperkalemia [E87.5] 09/20/2015   Total Time spent with patient: 20 minutes  Past Psychiatric History: Depression.  Past Medical History:  Past Medical History:  Diagnosis Date  . Anxiety   . Chronic kidney disease   . Chronic low back pain   . Hypertension   . Sciatica   . Scoliosis     Past Surgical History:  Procedure Laterality Date  . CLEFT PALATE REPAIR    . EYE SURGERY     "when I was  small"  . PERIPHERAL VASCULAR CATHETERIZATION N/A 09/29/2015   Procedure: Dialysis/Perma Catheter Insertion;  Surgeon: Katha Cabal, MD;  Location: [place] [facility];  Service: Cardiovascular;  Laterality: N/A;  . ROTATOR CUFF REPAIR Left    Family History:  Family History  Problem Relation Age of Onset  . Hypertension Other    Family Psychiatric  History: See H&P.  Social History:  History  Alcohol Use No     History  Drug Use No    Social History   Social History  . Marital status: Single    Spouse name: N/A  . Number of children: N/A  . Years of education: N/A   Social History Main Topics  . Smoking status: Never Smoker  . Smokeless tobacco: Never Used  . Alcohol use No  . Drug use: No  . Sexual activity: Not Currently   Other Topics Concern  . None   Social History Narrative  . None   Additional Social History:    Pain Medications: see PTA meds Prescriptions: see PTA meds Over the Counter: see PTA meds History of alcohol / drug use?: No history of alcohol / drug abuse                    Sleep: Fair  Appetite:  Fair  Current Medications: Current Facility-Administered Medications  Medication Dose Route Frequency Provider Last Rate Last Dose  . acetaminophen (TYLENOL) tablet 650 mg  650 mg Oral Q6H  PRN Gonzella Lex, MD   650 mg at 07/23/16 1558  . alum & mag hydroxide-simeth (MAALOX/MYLANTA) 200-200-20 MG/5ML suspension 30 mL  30 mL Oral Q4H PRN Gonzella Lex, MD      . aspirin chewable tablet 81 mg  81 mg Oral Daily Vittoria Noreen B Alynah Schone, MD   81 mg at 07/24/16 0821  . buPROPion (WELLBUTRIN XL) 24 hr tablet 150 mg  150 mg Oral Daily Clovis Fredrickson, MD   150 mg at 07/24/16 6967  . calcium acetate (PHOSLO) capsule 1,334 mg  1,334 mg Oral TID WC Lavonia Dana, MD   1,334 mg at 07/24/16 1232  . lidocaine-prilocaine (EMLA) cream   Topical Once Lavonia Dana, MD      . magnesium hydroxide (MILK OF MAGNESIA) suspension 30 mL  30 mL Oral  Daily PRN Gonzella Lex, MD      . metoprolol succinate (TOPROL-XL) 24 hr tablet 50 mg  50 mg Oral Daily Gonzella Lex, MD   50 mg at 07/24/16 8938  . [START ON 07/25/2016] midodrine (PROAMATINE) tablet 10 mg  10 mg Oral Q T,Th,Sa-HD Harmeet Singh, MD      . mirtazapine (REMERON) tablet 15 mg  15 mg Oral QHS [place] B Teya Otterson, MD   15 mg at 07/23/16 2235  . pantoprazole (PROTONIX) EC tablet 40 mg  40 mg Oral Daily Gonzella Lex, MD   40 mg at 07/24/16 0820  . QUEtiapine (SEROQUEL) tablet 100 mg  100 mg Oral QHS Devine Klingel B Hue Steveson, MD   100 mg at 07/23/16 2235  . senna-docusate (Senokot-S) tablet 1 tablet  1 tablet Oral QHS Clovis Fredrickson, MD   1 tablet at 07/23/16 2235  . traMADol (ULTRAM) tablet 50 mg  50 mg Oral Q6H PRN Clovis Fredrickson, MD   50 mg at 07/23/16 2235    Lab Results:  Results for orders placed or performed during the hospital encounter of 07/17/16 (from the past 48 hour(s))  CBC     Status: Abnormal   Collection Time: 07/23/16  9:00 AM  Result Value Ref Range   WBC 5.7 3.8 - 10.6 K/uL   RBC 3.37 (L) 4.40 - 5.90 MIL/uL   Hemoglobin 10.8 (L) 13.0 - 18.0 g/dL   HCT 31.1 (L) 40.0 - 52.0 %   MCV 92.2 80.0 - 100.0 fL   MCH 32.0 26.0 - 34.0 pg   MCHC 34.7 32.0 - 36.0 g/dL   RDW 15.4 (H) 11.5 - 14.5 %   Platelets 103 (L) 150 - 440 K/uL  Renal function panel     Status: Abnormal   Collection Time: 07/23/16  9:00 AM  Result Value Ref Range   Sodium 137 135 - 145 mmol/L   Potassium 5.4 (H) 3.5 - 5.1 mmol/L   Chloride 102 101 - 111 mmol/L   CO2 24 22 - 32 mmol/L   Glucose, Bld 123 (H) 65 - 99 mg/dL   BUN 88 (H) 6 - 20 mg/dL   Creatinine, Ser 10.99 (H) 0.61 - 1.24 mg/dL   Calcium 8.5 (L) 8.9 - 10.3 mg/dL   Phosphorus 5.3 (H) 2.5 - 4.6 mg/dL   Albumin 3.9 3.5 - 5.0 g/dL   GFR calc non Af Amer 5 (L) >60 mL/min   GFR calc Af Amer 5 (L) >60 mL/min    Comment: (NOTE) The eGFR has been calculated using the CKD EPI equation. This calculation has not been  validated in all clinical situations. eGFR's persistently <60  mL/min signify possible Chronic Kidney Disease.    Anion gap 11 5 - 15    Blood Alcohol level:  Lab Results  Component Value Date   ETH <5 81/85/6314    Metabolic Disorder Labs: Lab Results  Component Value Date   HGBA1C 5.2 07/16/2016   MPG 103 07/16/2016   No results found for: PROLACTIN No results found for: CHOL, TRIG, HDL, CHOLHDL, VLDL, LDLCALC  Physical Findings: AIMS: Facial and Oral Movements Muscles of Facial Expression: None, normal Lips and Perioral Area: None, normal Jaw: None, normal Tongue: None, normal,Extremity Movements Upper (arms, wrists, hands, fingers): None, normal Lower (legs, knees, ankles, toes): None, normal, Trunk Movements Neck, shoulders, hips: None, normal, Overall Severity Severity of abnormal movements (highest score from questions above): None, normal Incapacitation due to abnormal movements: None, normal Patient's awareness of abnormal movements (rate only patient's report): No Awareness, Dental Status Current problems with teeth and/or dentures?: No Does patient usually wear dentures?: No  CIWA:    COWS:     Musculoskeletal: Strength & Muscle Tone: within normal limits Gait & Station: normal Patient leans: N/A  Psychiatric Specialty Exam: Physical Exam  Nursing note and vitals reviewed.   Review of Systems  Constitutional: Positive for malaise/fatigue.  Neurological: Positive for weakness.  Psychiatric/Behavioral: Positive for depression and suicidal ideas.    Blood pressure 115/68, pulse 74, temperature 97.9 F (36.6 C), resp. rate 16, height _0  (1.854 m), weight 118 kg (260 lb 2.3 oz), SpO2 100 %.Body mass index is 34.32 kg/m.  General Appearance: Casual  Eye Contact:  Good  Speech:  Clear and Coherent  Volume:  Decreased  Mood:  Depressed and Hopeless  Affect:  Blunt  Thought Process:  Goal Directed and Descriptions of Associations: Intact   Orientation:  Full (Time, Place, and Person)  Thought Content:  WDL  Suicidal Thoughts:  Yes.  with intent/plan  Homicidal Thoughts:  No  Memory:  Immediate;   Fair Recent;   Fair Remote;   Fair  Judgement:  Impaired  Insight:  Shallow  Psychomotor Activity:  Psychomotor Retardation  Concentration:  Concentration: Fair and Attention Span: Fair  Recall:  [employer] of Knowledge:  Fair  Language:  Fair  Akathisia:  No  Handed:  Right  AIMS (if indicated):     Assets:  Communication Skills Desire for Improvement Financial Resources/Insurance Housing Resilience Social Support  ADL's:  Intact  Cognition:  WNL  Sleep:  Number of Hours: 6     Treatment Plan Summary: Daily contact with patient to assess and evaluate symptoms and progress in treatment and Medication management   Mr. Mccrystal is a 55 year old male with a history of developmental disability, depression, and chronic kidney disease on dialysis admitted for suicidal ideation.  1. Suicidal ideation. The patient is able to contract for safety in the hospital.  2. Mood. We will lower Remeron to 7.5 mg nightly. We started Wellbutrin in the morning for depression as he is asking for "energy pill". We started Seroquel 100 mg for augmentation and insomnia.    3. Insomnia. He did not respond to Trazodone or Restoril. He is on Seroquel now.  4. GERD. He is on Protonix.  5. Chronic kidney disease. Nephrology consult is greatly appreciated. The patient will continue dialysis.  6. Hypertension. He is on metoprolol.  7. Leg pain. We started Tramadol. Will consider Requip.  8. Disposition. He will be discharged back with his mother. He will follow up with his regular psychiatrist  Orson Slick, MD 07/24/2016, 2:14 PM

## 2016-07-24 NOTE — BHH Group Notes (Signed)
[facility] LCSW Group Therapy Note  Date/Time: 07/24/2016 1:00pm   Type of Therapy/Topic:  Group Therapy:  Emotion Regulation  Participation Level:  Active   Mood: Reports okay mood  Description of Group:    The purpose of this group is to assist patients in learning to regulate negative emotions and experience positive emotions. Patients will be guided to discuss ways in which they have been vulnerable to their negative emotions. These vulnerabilities will be juxtaposed with experiences of positive emotions or situations, and patients challenged to use positive emotions to combat negative ones. Special emphasis will be placed on coping with negative emotions in conflict situations, and patients will process healthy conflict resolution skills.  Therapeutic Goals: 1. Patient will identify two positive emotions or experiences to reflect on in order to balance out negative emotions:  2. Patient will label two or more emotions that they find the most difficult to experience:  3. Patient will be able to demonstrate positive conflict resolution skills through discussion or role plays:   Summary of Patient Progress:  Pt able to meet therapeutic goals.  Thought process much more clear and less disorganized.  Supportive of other group members and contributing to group voluntarily and in a purposeful way.    Therapeutic Modalities:   Cognitive Behavioral Therapy Feelings Identification Dialectical Behavioral Therapy  Dossie Arbour, MSW, LCSW

## 2016-07-24 NOTE — Progress Notes (Signed)
Patient is pleasant & cooperative in the unit.Appropriate with staff & peers.Compliant with medications.Attended groups.Appetite & energy level good.Support & encouragement given.

## 2016-07-24 NOTE — Progress Notes (Signed)
Recreation Therapy Notes  Date: 11.22.17 Time: 9:30 am Location: Craft Room  Group Topic: Self-esteem  Goal Area(s) Addresses:  Patient will write at least one positive trait about self. Patient will verbalize benefit of having a healthy self-esteem.  Behavioral Response: Attentive, Interactive  Intervention: I Am  Activity: Patients were given a worksheet with the letter I on it and were instructed to write as many positive traits about themselves inside the letter.  Education: LRT educated patients on ways they can increase their self-esteem.  Education Outcome: Acknowledges education/In group clarification offered  Clinical Observations/Feedback: Patient wrote positive traits. Patient contributed to group discussion by stating how he can increase his self-esteem.  Leonette Monarch, LRT/CTRS 07/24/2016 10:23 AM

## 2016-07-25 MED ORDER — EPOETIN ALFA 4000 UNIT/ML IJ SOLN
4000.0000 [IU] | INTRAMUSCULAR | Status: DC
  Administered 2016-07-27: 4000 [IU] via INTRAVENOUS
  Filled 2016-07-25: qty 1

## 2016-07-25 NOTE — Plan of Care (Signed)
Problem: Coping: Goal: Ability to verbalize feelings will improve Outcome: Progressing Patient verbalized feelings to staff.    

## 2016-07-25 NOTE — Progress Notes (Signed)
Dialysis nurse Al RN called and ask to transfer patient to for dialysis treatment. Tech did go with patient. Patient without any signs of distress.

## 2016-07-25 NOTE — Progress Notes (Signed)
Dialysis started 

## 2016-07-25 NOTE — Progress Notes (Signed)
[facility]  ROUNDING NOTE   Subjective:   Seen and examined on hemodialysis. Tolerating treatment well.     HEMODIALYSIS FLOWSHEET:  Blood Flow Rate (mL/min): 400 mL/min Arterial Pressure (mmHg): -190 mmHg Venous Pressure (mmHg): 180 mmHg Transmembrane Pressure (mmHg): 30 mmHg Ultrafiltration Rate (mL/min): 0 mL/min Dialysate Flow Rate (mL/min): 600 ml/min Conductivity: Machine : 14 Conductivity: Machine : 14 Dialysis Fluid Bolus: Normal Saline Bolus Amount (mL): 250 mL Dialysate Change: Other (comment) (3K) Intra-Hemodialysis Comments: 294. UF off    Objective:  Vital signs in last 24 hours:  Temp:  [97.8 F (36.6 C)-98.9 F (37.2 C)] 97.8 F (36.6 C) (11/23 0753) Pulse Rate:  [70-74] 70 (11/23 0830) Resp:  [12-16] 15 (11/23 0830) BP: (99-137)/(60-82) 99/63 (11/23 0830) SpO2:  [98 %-100 %] 100 % (11/23 0830) Weight:  [118.8 kg (261 lb 14.5 oz)] 118.8 kg (261 lb 14.5 oz) (11/23 0753)  Weight change:  Filed Weights   07/23/16 0856 07/23/16 1237 07/25/16 0753  Weight: 119 kg (262 lb 5.6 oz) 118 kg (260 lb 2.3 oz) 118.8 kg (261 lb 14.5 oz)    Intake/Output: I/O last 3 completed shifts: In: 600 [P.O.:600] Out: -    Intake/Output this shift:  No intake/output data recorded.  Physical Exam: General: NAD,   Head: Normocephalic, atraumatic. Moist oral mucosal membranes  Eyes: Anicteric,   Neck: Supple, trachea midline  Lungs:  Clear to auscultation  Heart: Regular rate and rhythm  Abdomen:  Soft, nontender,   Extremities: trace peripheral edema.  Neurologic: Nonfocal, moving all four extremities  Skin: No lesions  Access: Left arm AVF    Basic Metabolic Panel:  Recent Labs Lab 07/20/16 1110 07/23/16 0900  NA 137 137  K 4.5 5.4*  CL 100* 102  CO2 28 24  GLUCOSE 98 123*  BUN 50* 88*  CREATININE 8.58* 10.99*  CALCIUM 8.4* 8.5*  PHOS 3.8 5.3*    Liver Function Tests:  Recent Labs Lab 07/20/16 1110 07/23/16 0900  ALBUMIN 3.7 3.9    No results for input(s): LIPASE, AMYLASE in the last 168 hours. No results for input(s): AMMONIA in the last 168 hours.  CBC:  Recent Labs Lab 07/18/16 1200 07/20/16 1110 07/23/16 0900  WBC 4.5 5.0 5.7  HGB 11.2* 10.7* 10.8*  HCT 33.0* 31.6* 31.1*  MCV 92.6 92.0 92.2  PLT 105* 105* 103*    Cardiac Enzymes: No results for input(s): CKTOTAL, CKMB, CKMBINDEX, TROPONINI in the last 168 hours.  BNP: Invalid input(s): POCBNP  CBG: No results for input(s): GLUCAP in the last 168 hours.  Microbiology: Results for orders placed or performed during the hospital encounter of 09/19/15  CULTURE, BLOOD (ROUTINE X 2) w Reflex to PCR ID Panel     Status: None   Collection Time: 09/25/15 11:08 AM  Result Value Ref Range Status   Specimen Description BLOOD LEFT HAND  Final   Special Requests BOTTLES DRAWN AEROBIC AND ANAEROBIC [place]  Final   Culture NO GROWTH 5 DAYS  Final   Report Status 09/30/2015 FINAL  Final  CULTURE, BLOOD (ROUTINE X 2) w Reflex to PCR ID Panel     Status: None   Collection Time: 09/25/15  3:41 PM  Result Value Ref Range Status   Specimen Description BLOOD LINE  Final   Special Requests   Final    BOTTLES DRAWN AEROBIC AND ANAEROBIC AERO 8CC ANA 14CC   Culture NO GROWTH 5 DAYS  Final   Report Status 09/30/2015 FINAL  Final  Wound culture     Status: None   Collection Time: 09/26/15  3:02 PM  Result Value Ref Range Status   Specimen Description WOUND  Final   Special Requests Normal  Final   Gram Stain RARE WBC SEEN NO ORGANISMS SEEN   Final   Culture LIGHT GROWTH STAPHYLOCOCCUS AUREUS  Final   Report Status 09/29/2015 FINAL  Final   Organism ID, Bacteria STAPHYLOCOCCUS AUREUS  Final      Susceptibility   Staphylococcus aureus - MIC*    OXACILLIN Value in next row Sensitive      SENSITIVE<=0.25    GENTAMICIN Value in next row Sensitive      SENSITIVE<=0.5    CIPROFLOXACIN Value in next row Sensitive      SENSITIVE<=0.5    ERYTHROMYCIN  Value in next row Sensitive      SENSITIVE<=0.25    CLINDAMYCIN Value in next row Sensitive      SENSITIVE<=0.25    VANCOMYCIN Value in next row Sensitive      SENSITIVE1    TETRACYCLINE Value in next row Sensitive      SENSITIVE<=1    RIFAMPIN Value in next row Sensitive      SENSITIVE<=0.5    TRIMETH/SULFA Value in next row Sensitive      SENSITIVE<=10    * LIGHT GROWTH STAPHYLOCOCCUS AUREUS    Coagulation Studies: No results for input(s): LABPROT, INR in the last 72 hours.  Urinalysis: No results for input(s): COLORURINE, LABSPEC, PHURINE, GLUCOSEU, HGBUR, BILIRUBINUR, KETONESUR, PROTEINUR, UROBILINOGEN, NITRITE, LEUKOCYTESUR in the last 72 hours.  Invalid input(s): APPERANCEUR    Imaging: No results found.   Medications:    . aspirin  81 mg Oral Daily  . buPROPion  150 mg Oral Daily  . calcium acetate  1,334 mg Oral TID WC  . lidocaine-prilocaine   Topical Once  . metoprolol succinate  50 mg Oral Daily  . midodrine  10 mg Oral Q T,Th,Sa-HD  . mirtazapine  7.5 mg Oral QHS  . pantoprazole  40 mg Oral Daily  . QUEtiapine  150 mg Oral QHS  . senna-docusate  1 tablet Oral QHS   acetaminophen, alum & mag hydroxide-simeth, magnesium hydroxide, traMADol  Assessment/ Plan:  [name] is a 55 y.o. white male with past medical history of developmental deficit, hypertension, chronic low back pain, scoliosis, cardiac ablation for PSVT, ESRD February 2017.   TTS [facility] [place]  1. End Stage Renal Disease: AVF Seen and examined on dialysis TTS schedule UF turned off due to low BP  2. Hypertension: Blood pressure at goal.  - metoprolol - gets midodrine before dialysis treatments  3. Anemia of chronic kidney disease:  - Mircera as outpatient.  Add low dose EPO  4. Secondary Hyperparathyroidism: outpatient PTH elevated 774. On 07/02/16.  -  calcium acetate with meals.    LOS: 8 Ronnie Keith 11/23/20179:01 AM

## 2016-07-25 NOTE — Progress Notes (Signed)
Dialysis complete

## 2016-07-25 NOTE — Progress Notes (Signed)
Post dialysis 

## 2016-07-25 NOTE — Plan of Care (Signed)
Problem: Coping: Goal: Ability to verbalize frustrations and anger appropriately will improve Outcome: Not Progressing Pt not able to verbalize frustrations with coping skills at this time [facility] RN

## 2016-07-25 NOTE — Progress Notes (Signed)
D: Pt denies SI/HI/AVH. Pt is pleasant and cooperative, affect is flat, but brightens upon approach, no distress noted Patient  appears less anxious and he is interacting with peers and staff appropriately. Patient thoughts are organized no bizarre behavior noted. Patient is a dialysis patient; shunt noted in the left upper arm,  felt for thrill and listened for bruit, patient scheduled for dialysis tomorrow.    A: Pt was offered support and encouragement. Pt was given scheduled medications. Pt was encouraged to attend groups. Q 15 minute checks were done for safety.  R:Pt attends groups and interacts well with peers and staff. Pt is taking medication. Pt has no complaints.Pt receptive

## 2016-07-25 NOTE — Progress Notes (Signed)
Patient is alert and oriented, up to eat lunch, takes medications without difficulty, Patient is safe, has flat affect and is denying Si at this time, states " I was hoping to go home today" patient is pleasant,  Has 15 minute checks, encouraged to call for assist if anything needed or any concerns.

## 2016-07-25 NOTE — Progress Notes (Signed)
Pre Dialysis 

## 2016-07-25 NOTE — Progress Notes (Signed)
D: Patient is alert and oriented but with developmental delays on the unit this shift. Patient attended and  participated in groups today. Patient denies suicidal ideation, homicidal ideation, auditory or visual hallucinations at the present time.  A: Scheduled medications are administered to patient as per MD orders. Emotional support and encouragement are provided. Patient is maintained on q.15 minute safety checks. Patient is informed to notify staff with questions or concerns. R: No adverse medication reactions are noted. Patient is cooperative with medication administration and treatment plan today. Patient is receptive, calm ,sad and cooperative on the unit at this time. Patient  does not  nteract with others on the unit this shift. Patient contracts for safety at this time. Patient remains safe at this time.

## 2016-07-25 NOTE — [facility] Group Notes (Signed)
[facility] Group Notes:  (Nursing/MHT/Case Management/Adjunct)  Date:  07/25/2016  Time:  2:59 AM  Type of Therapy:  Psychoeducational Skills  Participation Level:  Active  Participation Quality:  Appropriate, Sharing and Supportive  Affect:  Appropriate  Cognitive:  Appropriate  Insight:  Appropriate, Good and Improving  Engagement in Group:  Engaged  Modes of Intervention:  Discussion, Socialization and Support  Summary of Progress/Problems:  Reece Agar 07/25/2016, 2:59 AM

## 2016-07-25 NOTE — Progress Notes (Signed)
Patient refuses supper, states that He wants to sleep, Patient is safe, denies si at this time. q 15 minute checks in progress.

## 2016-07-25 NOTE — Progress Notes (Signed)
[facility] MD Progress Note  07/25/2016 11:36 AM LINSEY HIROTA  MRN:  [id number]  Subjective:  Mr. Woodin is a 55 year old male with a history of mild developmental disability, depression, and end-stage kidney disease on dialysis. He was admitted for voicing suicidal ideation at the dialysis Center. He was consulted by nephrology and continues dialysis here.   According to the staff patient has been improving. His mood is improving and affect is brighter. Patient was out for dialysis and was not evaluated today. Patient has started participating in the programming He sleeps all day long at home, stay up all night long to the point that she takes him to urgent care for his visit in the middle of the night. He is very dissatisfied with his dialysis as they are painful and somehow lidocaine gel is not offered. He accepts medications and tolerates them well.   Principal Problem: Major depressive disorder, recurrent severe without psychotic features ([place]) Diagnosis:   Patient Active Problem List   Diagnosis Date Noted  . Chronic kidney disease [N18.9] 07/17/2016  . Intellectual disability [F79] 07/16/2016  . Major depressive disorder, recurrent severe without psychotic features ([place]) [F33.2] 09/28/2015  . Hyperkalemia [E87.5] 09/20/2015   Total Time spent with patient: 20 minutes  Past Psychiatric History: Depression.  Past Medical History:  Past Medical History:  Diagnosis Date  . Anxiety   . Chronic kidney disease   . Chronic low back pain   . Hypertension   . Sciatica   . Scoliosis     Past Surgical History:  Procedure Laterality Date  . CLEFT PALATE REPAIR    . EYE SURGERY     "when I was small"  . PERIPHERAL VASCULAR CATHETERIZATION N/A 09/29/2015   Procedure: Dialysis/Perma Catheter Insertion;  Surgeon: Katha Cabal, MD;  Location: [facility];  Service: Cardiovascular;  Laterality: N/A;  . ROTATOR CUFF REPAIR Left    Family History:  Family History  Problem Relation Age  of Onset  . Hypertension Other    Family Psychiatric  History: See H&P.  Social History:  History  Alcohol Use No     History  Drug Use No    Social History   Social History  . Marital status: Single    Spouse name: N/A  . Number of children: N/A  . Years of education: N/A   Social History Main Topics  . Smoking status: Never Smoker  . Smokeless tobacco: Never Used  . Alcohol use No  . Drug use: No  . Sexual activity: Not Currently   Other Topics Concern  . None   Social History Narrative  . None   Additional Social History:    Pain Medications: see PTA meds Prescriptions: see PTA meds Over the Counter: see PTA meds History of alcohol / drug use?: No history of alcohol / drug abuse                    Sleep: Fair  Appetite:  Fair  Current Medications: Current Facility-Administered Medications  Medication Dose Route Frequency Provider Last Rate Last Dose  . acetaminophen (TYLENOL) tablet 650 mg  650 mg Oral Q6H PRN Gonzella Lex, MD   650 mg at 07/23/16 1558  . alum & mag hydroxide-simeth (MAALOX/MYLANTA) 200-200-20 MG/5ML suspension 30 mL  30 mL Oral Q4H PRN Gonzella Lex, MD      . aspirin chewable tablet 81 mg  81 mg Oral Daily Jolanta B Pucilowska, MD   81 mg  at 07/24/16 6599  . buPROPion (WELLBUTRIN XL) 24 hr tablet 150 mg  150 mg Oral Daily Clovis Fredrickson, MD   150 mg at 07/24/16 3570  . calcium acetate (PHOSLO) capsule 1,334 mg  1,334 mg Oral TID WC Lavonia Dana, MD   1,334 mg at 07/24/16 1703  . epoetin alfa (EPOGEN,PROCRIT) injection 4,000 Units  4,000 Units Intravenous Q T,Th,Sa-HD Murlean Iba, MD      . lidocaine-prilocaine (EMLA) cream   Topical Once Lavonia Dana, MD      . magnesium hydroxide (MILK OF MAGNESIA) suspension 30 mL  30 mL Oral Daily PRN Gonzella Lex, MD      . metoprolol succinate (TOPROL-XL) 24 hr tablet 50 mg  50 mg Oral Daily Gonzella Lex, MD   50 mg at 07/24/16 1779  . midodrine (PROAMATINE) tablet 10 mg  10  mg Oral Q T,Th,Sa-HD Murlean Iba, MD   10 mg at 07/25/16 0842  . mirtazapine (REMERON) tablet 7.5 mg  7.5 mg Oral QHS Jolanta B Pucilowska, MD   7.5 mg at 07/24/16 2237  . pantoprazole (PROTONIX) EC tablet 40 mg  40 mg Oral Daily Gonzella Lex, MD   40 mg at 07/24/16 0820  . QUEtiapine (SEROQUEL) tablet 150 mg  150 mg Oral QHS Clovis Fredrickson, MD   150 mg at 07/24/16 2237  . senna-docusate (Senokot-S) tablet 1 tablet  1 tablet Oral QHS Clovis Fredrickson, MD   1 tablet at 07/24/16 2237  . traMADol (ULTRAM) tablet 50 mg  50 mg Oral Q6H PRN Clovis Fredrickson, MD   50 mg at 07/23/16 2235    Lab Results:  No results found for this or any previous visit (from the past 48 hour(s)).  Blood Alcohol level:  Lab Results  Component Value Date   ETH <5 39/11/90    Metabolic Disorder Labs: Lab Results  Component Value Date   HGBA1C 5.2 07/16/2016   MPG 103 07/16/2016   No results found for: PROLACTIN No results found for: CHOL, TRIG, HDL, CHOLHDL, VLDL, LDLCALC  Physical Findings: AIMS: Facial and Oral Movements Muscles of Facial Expression: None, normal Lips and Perioral Area: None, normal Jaw: None, normal Tongue: None, normal,Extremity Movements Upper (arms, wrists, hands, fingers): None, normal Lower (legs, knees, ankles, toes): None, normal, Trunk Movements Neck, shoulders, hips: None, normal, Overall Severity Severity of abnormal movements (highest score from questions above): None, normal Incapacitation due to abnormal movements: None, normal Patient's awareness of abnormal movements (rate only patient's report): No Awareness, Dental Status Current problems with teeth and/or dentures?: No Does patient usually wear dentures?: No  CIWA:    COWS:     Musculoskeletal: Strength & Muscle Tone: within normal limits Gait & Station: normal Patient leans: N/A  Psychiatric Specialty Exam: Physical Exam  Nursing note and vitals reviewed.   Review of Systems   Constitutional: Positive for malaise/fatigue.  Neurological: Positive for weakness.  Psychiatric/Behavioral: Positive for depression and suicidal ideas.    Blood pressure 116/75, pulse 79, temperature 97.7 F (36.5 C), temperature source Oral, resp. rate 14, height 6\' 1"  (1.854 m), weight 261 lb 14.5 oz (118.8 kg), SpO2 100 %.Body mass index is 34.55 kg/m.  MSE- Not done today  Treatment Plan Summary: Daily contact with patient to assess and evaluate symptoms and progress in treatment and Medication management   Mr. Glascoe is a 55 year old male with a history of developmental disability, depression, and chronic kidney disease on dialysis admitted for suicidal ideation.  1. Suicidal ideation. The patient is able to contract for safety in the hospital.  2. Mood. We will lower Remeron to 7.5 mg nightly. We started Wellbutrin in the morning for depression as he is asking for "energy pill". We started Seroquel 100 mg for augmentation and insomnia.    3. Insomnia. He did not respond to Trazodone or Restoril. He is on Seroquel now.  4. GERD. He is on Protonix.  5. Chronic kidney disease. Nephrology consult is greatly appreciated. The patient will continue dialysis.  6. Hypertension. He is on metoprolol.  7. Leg pain. We started Tramadol. Will consider Requip.  8. Disposition. He will be discharged back with his mother. He will follow up with his regular psychiatrist    Rainey Pines, MD 07/25/2016, 11:36 AM

## 2016-07-26 MED ORDER — MIRTAZAPINE 15 MG PO TABS
15.0000 mg | ORAL_TABLET | Freq: Every day | ORAL | Status: DC
  Administered 2016-07-26: 15 mg via ORAL
  Filled 2016-07-26: qty 1

## 2016-07-26 NOTE — [facility] Group Notes (Signed)
[facility] Group Notes:  (Nursing/MHT/Case Management/Adjunct)  Date:  07/26/2016  Time:  3:44 AM  Type of Therapy:  Group Therapy  Participation Level:  Active  Participation Quality:  Appropriate  Affect:  Appropriate  Cognitive:  Appropriate  Insight:  Appropriate  Engagement in Group:  Engaged  Modes of Intervention:  n/a  Summary of Progress/Problems:  Ronnie Keith 07/26/2016, 3:44 AM

## 2016-07-26 NOTE — Progress Notes (Signed)
[facility] MD Progress Note  07/26/2016 5:37 PM CARRIE SCHOONMAKER  MRN:  [id number]  Subjective:  Ronnie Keith is a 55 year old male with a history of mild developmental disability, depression, and end-stage kidney disease on dialysis. He was admitted for voicing suicidal ideation at the dialysis Center. He was consulted by nephrology and continues dialysis here.   Ronnie Keith was evaluated this morning. He was walking with the help of walker. He reported that he could not sleep last night after he had his dialysis. He reported that he has been compliant with his medications. His mood is not improving and affect seems calm. He is out of his room and in the community on today. He started participating in programming.  He is very dissatisfied with his dialysis as they are painful and somehow lidocaine gel is not offered. The patient today denies any somatic symptoms. He moves around with a walker. He accepts medications and tolerates them well.   Principal Problem: Major depressive disorder, recurrent severe without psychotic features ([place]) Diagnosis:   Patient Active Problem List   Diagnosis Date Noted  . Chronic kidney disease [N18.9] 07/17/2016  . Intellectual disability [F79] 07/16/2016  . Major depressive disorder, recurrent severe without psychotic features ([place]) [F33.2] 09/28/2015  . Hyperkalemia [E87.5] 09/20/2015   Total Time spent with patient: 20 minutes  Past Psychiatric History: Depression.  Past Medical History:  Past Medical History:  Diagnosis Date  . Anxiety   . Chronic kidney disease   . Chronic low back pain   . Hypertension   . Sciatica   . Scoliosis     Past Surgical History:  Procedure Laterality Date  . CLEFT PALATE REPAIR    . EYE SURGERY     "when I was small"  . PERIPHERAL VASCULAR CATHETERIZATION N/A 09/29/2015   Procedure: Dialysis/Perma Catheter Insertion;  Surgeon: Katha Cabal, MD;  Location: [place] [facility];  Service: Cardiovascular;  Laterality: N/A;  .  ROTATOR CUFF REPAIR Left    Family History:  Family History  Problem Relation Age of Onset  . Hypertension Other    Family Psychiatric  History: See H&P.  Social History:  History  Alcohol Use No     History  Drug Use No    Social History   Social History  . Marital status: Single    Spouse name: N/A  . Number of children: N/A  . Years of education: N/A   Social History Main Topics  . Smoking status: Never Smoker  . Smokeless tobacco: Never Used  . Alcohol use No  . Drug use: No  . Sexual activity: Not Currently   Other Topics Concern  . None   Social History Narrative  . None   Additional Social History:    Pain Medications: see PTA meds Prescriptions: see PTA meds Over the Counter: see PTA meds History of alcohol / drug use?: No history of alcohol / drug abuse                    Sleep: Fair  Appetite:  Fair  Current Medications: Current Facility-Administered Medications  Medication Dose Route Frequency Provider Last Rate Last Dose  . acetaminophen (TYLENOL) tablet 650 mg  650 mg Oral Q6H PRN Gonzella Lex, MD   650 mg at 07/23/16 1558  . alum & mag hydroxide-simeth (MAALOX/MYLANTA) 200-200-20 MG/5ML suspension 30 mL  30 mL Oral Q4H PRN Gonzella Lex, MD      . aspirin chewable tablet 81  mg  81 mg Oral Daily Clovis Fredrickson, MD   81 mg at 07/26/16 0849  . buPROPion (WELLBUTRIN XL) 24 hr tablet 150 mg  150 mg Oral Daily Jolanta B Pucilowska, MD   150 mg at 07/26/16 0849  . calcium acetate (PHOSLO) capsule 1,334 mg  1,334 mg Oral TID WC Lavonia Dana, MD   1,334 mg at 07/26/16 1653  . epoetin alfa (EPOGEN,PROCRIT) injection 4,000 Units  4,000 Units Intravenous Q T,Th,Sa-HD Murlean Iba, MD      . lidocaine-prilocaine (EMLA) cream   Topical Once Lavonia Dana, MD      . magnesium hydroxide (MILK OF MAGNESIA) suspension 30 mL  30 mL Oral Daily PRN Gonzella Lex, MD      . metoprolol succinate (TOPROL-XL) 24 hr tablet 50 mg  50 mg Oral Daily  Gonzella Lex, MD   50 mg at 07/26/16 0849  . midodrine (PROAMATINE) tablet 10 mg  10 mg Oral Q T,Th,Sa-HD Murlean Iba, MD   10 mg at 07/25/16 0842  . mirtazapine (REMERON) tablet 15 mg  15 mg Oral QHS Rainey Pines, MD      . pantoprazole (PROTONIX) EC tablet 40 mg  40 mg Oral Daily Gonzella Lex, MD   40 mg at 07/26/16 0849  . QUEtiapine (SEROQUEL) tablet 150 mg  150 mg Oral QHS Jolanta B Pucilowska, MD   150 mg at 07/25/16 2200  . senna-docusate (Senokot-S) tablet 1 tablet  1 tablet Oral QHS Clovis Fredrickson, MD   1 tablet at 07/25/16 2202  . traMADol (ULTRAM) tablet 50 mg  50 mg Oral Q6H PRN Clovis Fredrickson, MD   50 mg at 07/26/16 0342    Lab Results:  No results found for this or any previous visit (from the past 48 hour(s)).  Blood Alcohol level:  Lab Results  Component Value Date   ETH <5 21/30/8657    Metabolic Disorder Labs: Lab Results  Component Value Date   HGBA1C 5.2 07/16/2016   MPG 103 07/16/2016   No results found for: PROLACTIN No results found for: CHOL, TRIG, HDL, CHOLHDL, VLDL, LDLCALC  Physical Findings: AIMS: Facial and Oral Movements Muscles of Facial Expression: None, normal Lips and Perioral Area: None, normal Jaw: None, normal Tongue: None, normal,Extremity Movements Upper (arms, wrists, hands, fingers): None, normal Lower (legs, knees, ankles, toes): None, normal, Trunk Movements Neck, shoulders, hips: None, normal, Overall Severity Severity of abnormal movements (highest score from questions above): None, normal Incapacitation due to abnormal movements: None, normal Patient's awareness of abnormal movements (rate only patient's report): No Awareness, Dental Status Current problems with teeth and/or dentures?: No Does patient usually wear dentures?: No  CIWA:    COWS:     Musculoskeletal: Strength & Muscle Tone: within normal limits Gait & Station: normal Patient leans: N/A  Psychiatric Specialty Exam: Physical Exam  Nursing  note and vitals reviewed.   Review of Systems  Constitutional: Positive for malaise/fatigue.  Neurological: Positive for weakness.  Psychiatric/Behavioral: Positive for depression and suicidal ideas.    Blood pressure 140/70, pulse 90, temperature 97.7 F (36.5 C), resp. rate 14, height 6\' 1"  (1.854 m), weight 261 lb 14.5 oz (118.8 kg), SpO2 100 %.Body mass index is 34.55 kg/m.  General Appearance: Casual  Eye Contact:  Good  Speech:  Clear and Coherent  Volume:  Decreased  Mood:  Depressed and Hopeless  Affect:  Blunt  Thought Process:  Goal Directed and Descriptions of Associations: Intact  Orientation:  Full (Time, Place, and Person)  Thought Content:  WDL  Suicidal Thoughts:  Yes.  with intent/plan  Homicidal Thoughts:  No  Memory:  Immediate;   Fair Recent;   Fair Remote;   Fair  Judgement:  Impaired  Insight:  Shallow  Psychomotor Activity:  Psychomotor Retardation  Concentration:  Concentration: Fair and Attention Span: Fair  Recall:  [employer] of Knowledge:  Fair  Language:  Fair  Akathisia:  No  Handed:  Right  AIMS (if indicated):     Assets:  Communication Skills Desire for Improvement Financial Resources/Insurance Housing Resilience Social Support  ADL's:  Intact  Cognition:  WNL  Sleep:  Number of Hours: 3.45     Treatment Plan Summary: Daily contact with patient to assess and evaluate symptoms and progress in treatment and Medication management   Ronnie Keith is a 55 year old male with a history of developmental disability, depression, and chronic kidney disease on dialysis admitted for suicidal ideation.  1. Suicidal ideation. The patient is able to contract for safety in the hospital.  2. Mood. We will lower Remeron to 7.5 mg nightly. We started Wellbutrin in the morning for depression as he is asking for "energy pill". We started Seroquel 100 mg for augmentation and insomnia.    3. Insomnia. He did not respond to Trazodone or Restoril. He is on  Seroquel now.  4. GERD. He is on Protonix.  5. Chronic kidney disease. Nephrology consult is greatly appreciated. The patient will continue dialysis.  6. Hypertension. He is on metoprolol.  7. Leg pain. We started Tramadol. Will consider Requip.  8. Disposition. He will be discharged back with his mother. He will follow up with his regular psychiatrist    Rainey Pines, MD 07/26/2016, 5:37 PM

## 2016-07-26 NOTE — Social Work (Signed)
[facility] LCSW Group Therapy   07/26/2016 9:30AM   Type of Therapy: Group Therapy   Participation Level: Invited but did not attend.  Participation Quality: Invited but did not attend.   [name], MSW, LCSWA 07/26/2016, 11:24AM

## 2016-07-26 NOTE — Progress Notes (Signed)
Patient is sleeping most of the shift.Stated that he could not sleep last night.Denies suicidal or homicidal ideations and AV hallucinations.Appropriate with staff & peers.Compliant with medications.Using walker for ambulation.Support & encouragement given.

## 2016-07-27 LAB — BASIC METABOLIC PANEL
Anion gap: 10 (ref 5–15)
BUN: 64 mg/dL — AB (ref 6–20)
CHLORIDE: 101 mmol/L (ref 101–111)
CO2: 25 mmol/L (ref 22–32)
Calcium: 9.1 mg/dL (ref 8.9–10.3)
Creatinine, Ser: 8.97 mg/dL — ABNORMAL HIGH (ref 0.61–1.24)
GFR calc non Af Amer: 6 mL/min — ABNORMAL LOW (ref >60)
GFR, EST AFRICAN AMERICAN: 7 mL/min — AB (ref >60)
Glucose, Bld: 96 mg/dL (ref 65–99)
POTASSIUM: 5.6 mmol/L — AB (ref 3.5–5.1)
SODIUM: 136 mmol/L (ref 135–145)

## 2016-07-27 LAB — PHOSPHORUS: PHOSPHORUS: 3.7 mg/dL (ref 2.5–4.6)

## 2016-07-27 MED ORDER — SERTRALINE HCL 50 MG PO TABS
50.0000 mg | ORAL_TABLET | Freq: Every day | ORAL | Status: DC
  Administered 2016-07-27 – 2016-07-28 (×2): 50 mg via ORAL
  Filled 2016-07-27 (×2): qty 1

## 2016-07-27 MED ORDER — QUETIAPINE FUMARATE 25 MG PO TABS
150.0000 mg | ORAL_TABLET | Freq: Every day | ORAL | Status: DC
  Administered 2016-07-27 – 2016-07-28 (×2): 150 mg via ORAL
  Filled 2016-07-27 (×2): qty 2

## 2016-07-27 MED ORDER — LIDOCAINE HCL (PF) 1 % IJ SOLN
2.0000 mL | Freq: Once | INTRAMUSCULAR | Status: AC
  Administered 2016-07-27: 2 mL via INTRADERMAL
  Filled 2016-07-27: qty 2

## 2016-07-27 MED ORDER — TRAZODONE HCL 100 MG PO TABS: 100.0000 mg | ORAL_TABLET | Freq: Every day | ORAL | Status: DC

## 2016-07-27 MED ORDER — METOPROLOL SUCCINATE ER 50 MG PO TB24
50.0000 mg | ORAL_TABLET | Freq: Every day | ORAL | Status: DC
  Administered 2016-07-28: 50 mg via ORAL
  Filled 2016-07-27: qty 1

## 2016-07-27 NOTE — Progress Notes (Signed)
Pre dialysis  

## 2016-07-27 NOTE — Progress Notes (Signed)
Pt denies SI/HI/AVH. Was concerned about not being able to sleep well. Currently, he is resting in room with eyes closed. Rated pain in right leg an 8. PRN ultram given was effective. Voiced no additional concerns. Appropriate interaction with staff and peers. Will continue to monitor.

## 2016-07-27 NOTE — Progress Notes (Signed)
HD completed without issue. Goal 1.5L met. Patient tolerated well.

## 2016-07-27 NOTE — [facility] Group Notes (Signed)
[facility] LCSW Group Therapy  07/27/2016 2:27 PM  Type of Therapy:  Group Therapy  Participation Level:  Minimal  Participation Quality:  Appropriate and Sharing  Affect:  Appropriate  Cognitive:  Alert  Insight:  Improving  Engagement in Therapy:  Improving  Modes of Intervention:  Activity, Discussion, Education and Support  Summary of Progress/Problems:Coping Skills: Patients defined and discussed healthy coping skills. Patients identified healthy coping skills they would like to try during hospitalization and after discharge. CSW offered insight to varying coping skills that may have been new to patients such as practicing mindfulness. Patient appropriately shared with the group and interacted with the other group members.   Arlis Everly G. [place], [place] 07/27/2016, 2:28 PM

## 2016-07-27 NOTE — BHH Group Notes (Signed)
[facility] Group Notes:  (Nursing/MHT/Case Management/Adjunct)  Date:  07/27/2016  Time:  11:44 PM  Type of Therapy:  Psychoeducational Skills  Participation Level:  Did Not Attend  Summary of Progress/Problems:  Kathi Ludwig 07/27/2016, 11:44 PM

## 2016-07-27 NOTE — Progress Notes (Signed)
Pre dialysis assessment 

## 2016-07-27 NOTE — Progress Notes (Signed)
Post HD assessment  

## 2016-07-27 NOTE — Progress Notes (Signed)
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 He was admitted for voicing suicidal ideation at the dialysis Center. He was consulted by nephrology and continues dialysis here.   The patient says his mood is still somewhat depressed but he denies any current active or passive suicidal thoughts. He says he got very depressed at the dialysis Center because he felt like the staff did not care about him. He does admit to being fairly isolative and does not have a lot of friends. He currently lives with his mother and is never been married and has no children. He denies any auditory or visual hallucinations. No paranoid thoughts or delusions. He got 1 dose of Wellbutrin and has been using Remeron and Seroquel since admission. He slept 6 hours last night. Appetite is good and he has been coming out of his room for meals and attending groups. He denies any somatic complaints but does report a lot of anxiety regarding dialysis. He denies any major panic attacks.  Supportive psychotherapy provided and Times spent helping patient to try and focus on improving social interactions outside of the house. He is fairly isolative at home and does not have a lot of friends   Past psychiatric history. Apparently he has never been hospitalized or attempted suicide. He was started on Remeron at that time he dialysis begun. There is mild developmental disability.  Family psychiatric history. He denies any history of any mental illness or substance use in the family  Social history. The patient is disabled he lives with his mother who is very supportive. The patient functions well at baseline. He is able to take care of his needs. He drives a car. He does have decision-making capacity.He has never been married and has no children  Substance Abuse  History: He denies any history of any heavy alcohol use or illicit drug use in the past.      .Principal Problem: Major depressive disorder, recurrent severe without psychotic features ([place]) Diagnosis:   Patient Active Problem List   Diagnosis Date Noted  . Chronic kidney disease [N18.9] 07/17/2016  . Intellectual disability [F79] 07/16/2016  . Major depressive disorder, recurrent severe without psychotic features ([place]) [F33.2] 09/28/2015  . Hyperkalemia [E87.5] 09/20/2015   Total Time spent with patient: 20 minutes  Past Psychiatric History: Depression.  Past Medical History:  Past Medical History:  Diagnosis Date  . Anxiety   . Chronic kidney disease   . Chronic low back pain   . Hypertension   . Sciatica   . Scoliosis     Past Surgical History:  Procedure Laterality Date  . CLEFT PALATE REPAIR    . EYE SURGERY     "when I was small"  . PERIPHERAL VASCULAR CATHETERIZATION N/A 09/29/2015   Procedure: Dialysis/Perma Catheter Insertion;  Surgeon: Katha Cabal, MD;  Location: [place] [facility];  Service: Cardiovascular;  Laterality: N/A;  . ROTATOR CUFF REPAIR Left    Family History:  Family History  Problem Relation Age of Onset  . Hypertension Other    Family Psychiatric  History: See H&P.  Social History:  History  Alcohol Use No     History  Drug Use No    Social History   Social History  . Marital status: Single    Spouse name: N/A  . Number of  children: N/A  . Years of education: N/A   Social History Main Topics  . Smoking status: Never Smoker  . Smokeless tobacco: Never Used  . Alcohol use No  . Drug use: No  . Sexual activity: Not Currently   Other Topics Concern  . None   Social History Narrative  . None   Additional Social History:    Pain Medications: see PTA meds Prescriptions: see PTA meds Over the Counter: see PTA meds History of alcohol / drug use?: No history of alcohol / drug abuse                    Sleep:  Fair  Appetite:  Good  Current Medications: Current Facility-Administered Medications  Medication Dose Route Frequency Provider Last Rate Last Dose  . acetaminophen (TYLENOL) tablet 650 mg  650 mg Oral Q6H PRN Gonzella Lex, MD   650 mg at 07/23/16 1558  . alum & mag hydroxide-simeth (MAALOX/MYLANTA) 200-200-20 MG/5ML suspension 30 mL  30 mL Oral Q4H PRN Gonzella Lex, MD      . aspirin chewable tablet 81 mg  81 mg Oral Daily Jolanta B Pucilowska, MD   81 mg at 07/26/16 0849  . calcium acetate (PHOSLO) capsule 1,334 mg  1,334 mg Oral TID WC Lavonia Dana, MD   1,334 mg at 07/27/16 0925  . epoetin alfa (EPOGEN,PROCRIT) injection 4,000 Units  4,000 Units Intravenous Q T,Th,Sa-HD Murlean Iba, MD      . lidocaine-prilocaine (EMLA) cream   Topical Once Lavonia Dana, MD      . magnesium hydroxide (MILK OF MAGNESIA) suspension 30 mL  30 mL Oral Daily PRN Gonzella Lex, MD      . metoprolol succinate (TOPROL-XL) 24 hr tablet 50 mg  50 mg Oral Daily Gonzella Lex, MD   50 mg at 07/26/16 0849  . midodrine (PROAMATINE) tablet 10 mg  10 mg Oral Q T,Th,Sa-HD Murlean Iba, MD   10 mg at 07/27/16 0925  . pantoprazole (PROTONIX) EC tablet 40 mg  40 mg Oral Daily Gonzella Lex, MD   40 mg at 07/26/16 0849  . senna-docusate (Senokot-S) tablet 1 tablet  1 tablet Oral QHS Clovis Fredrickson, MD   1 tablet at 07/26/16 2137  . sertraline (ZOLOFT) tablet 50 mg  50 mg Oral Daily Chauncey Mann, MD      . traMADol Veatrice Bourbon) tablet 50 mg  50 mg Oral Q6H PRN Clovis Fredrickson, MD   50 mg at 07/26/16 2138  . traZODone (DESYREL) tablet 100 mg  100 mg Oral QHS Chauncey Mann, MD        Lab Results:  No results found for this or any previous visit (from the past 48 hour(s)).  Blood Alcohol level:  Lab Results  Component Value Date   ETH <5 03/47/4259    Metabolic Disorder Labs: Lab Results  Component Value Date   HGBA1C 5.2 07/16/2016   MPG 103 07/16/2016   No results found for: PROLACTIN No  results found for: CHOL, TRIG, HDL, CHOLHDL, VLDL, LDLCALC  Physical Findings: AIMS: Facial and Oral Movements Muscles of Facial Expression: None, normal Lips and Perioral Area: None, normal Jaw: None, normal Tongue: None, normal,Extremity Movements Upper (arms, wrists, hands, fingers): None, normal Lower (legs, knees, ankles, toes): None, normal, Trunk Movements Neck, shoulders, hips: None, normal, Overall Severity Severity of abnormal movements (highest score from questions above): None, normal Incapacitation due to abnormal movements: None, normal Patient's awareness  of abnormal movements (rate only patient's report): No Awareness, Dental Status Current problems with teeth and/or dentures?: No Does patient usually wear dentures?: No  CIWA:    COWS:     Musculoskeletal: Strength & Muscle Tone: Decreased Gait & Station: unsteady; uses a walker Patient leans: N/A  Psychiatric Specialty Exam: Physical Exam  Nursing note and vitals reviewed.   Review of Systems  Constitutional: Positive for malaise/fatigue. Negative for chills, fever and weight loss.  HENT: Negative.  Negative for congestion, ear discharge, ear pain, hearing loss, nosebleeds, sinus pain, sore throat and tinnitus.   Eyes: Negative.  Negative for blurred vision, double vision, photophobia, pain, discharge and redness.  Respiratory: Negative.  Negative for cough, hemoptysis, sputum production, shortness of breath and wheezing.   Cardiovascular: Negative.  Negative for chest pain, palpitations, orthopnea, claudication, leg swelling and PND.  Gastrointestinal: Negative.  Negative for abdominal pain, blood in stool, constipation, diarrhea, heartburn, melena, nausea and vomiting.  Genitourinary: Negative.  Negative for dysuria, frequency and urgency.  Musculoskeletal: Negative.  Negative for back pain, joint pain, myalgias and neck pain.  Skin: Negative.  Negative for itching and rash.  Neurological: Positive for  weakness. Negative for dizziness, tingling, tremors and headaches.       He is ambulating with a walker  Endo/Heme/Allergies: Negative.  Does not bruise/bleed easily.    Blood pressure (!) 155/84, pulse 82, temperature 97.7 F (36.5 C), temperature source Oral, resp. rate 14, height 6\' 1"  (1.854 m), weight 118.8 kg (261 lb 14.5 oz), SpO2 100 %.Body mass index is 34.55 kg/m.  General Appearance: Casual  Eye Contact:  Good  Speech:  Clear and Coherent  Volume:  Normal  Mood:  Depressed  Affect:  Blunt  Thought Process:  Goal Directed and Descriptions of Associations: Intact  Orientation:  Full (Time, Place, and Person)  Thought Content:  WDL  Suicidal Thoughts:  Yes.  with intent/plan  Homicidal Thoughts:  No  Memory:  Immediate;   Fair Recent;   Fair Remote;   Fair  Judgement:  Impaired  Insight:  Shallow  Psychomotor Activity:  Psychomotor Retardation  Concentration:  Concentration: Fair and Attention Span: Fair  Recall:  [employer] of Knowledge:  Fair  Language:  Fair  Akathisia:  No  Handed:  Right  AIMS (if indicated):     Assets:  Communication Skills Desire for Improvement Financial Resources/Insurance Housing Resilience Social Support  ADL's:  Intact  Cognition:  WNL  Sleep:  Number of Hours: 6     Treatment Plan Summary:  Major depressive disorder, severe, recurrent without psychotic features Intellectual disability  Chronic kidney disease   Daily contact with patient to assess and evaluate symptoms and progress in treatment and Medication management   Ronnie Keith is a 55 year old male with a history of developmental disability, depression, and chronic kidney disease on dialysis admitted for suicidal ideation.  1. Suicidal ideation. The patient is able to contract for safety in the hospital.  2. Mood: The patient is reporting depressive symptoms. Will not use Wellbutrin secondary to increased seizure risk and possible electrolyte abnormalities. We'll use  Zoloft 50 mg by mouth daily for anxiety and depression. We'll use Seroquel 150 mg by mouth nightly for depression and insomnia. He failed Trazodone and Temazepam. He most likely needs a sleep study to rule out sleep apnea It is not clear that he needs a mood stabilizer. He has never been hospitalized in the past  4. GERD. He is on Protonix 40  mg by mouth daily  5. Chronic kidney disease. Nephrology consult is greatly appreciated. The patient will continue dialysis.  6. Hypertension.: Blood pressures slightly on the elevated side. We'll continue Toprol-XL 50 mg by mouth daily  7. Chronic back pain/Leg pain. : The patient does ambulate with a walker. We'll continue tramadol 50 mg by mouth every 6 hours when necessary for pain   8. Disposition. He will be discharged back with his mother. He will follow up with his regular psychiatrist    Jay Schlichter, MD 07/27/2016, 10:48 AM

## 2016-07-27 NOTE — Progress Notes (Signed)
[facility]  ROUNDING NOTE   Subjective:   Seen in behavioral health unit Doing well Denies nausea or vomiting, denies shortness of breath Denies constipation Has trace leg edema   Objective:  Vital signs in last 24 hours:  Temp:  [97.7 F (36.5 C)] 97.7 F (36.5 C) (11/25 0714) Pulse Rate:  [82] 82 (11/25 0714) BP: (155)/(84) 155/84 (11/25 0714)  Weight change:  Filed Weights   07/23/16 0856 07/23/16 1237 07/25/16 0753  Weight: 119 kg (262 lb 5.6 oz) 118 kg (260 lb 2.3 oz) 118.8 kg (261 lb 14.5 oz)    Intake/Output: I/O last 3 completed shifts: In: 54 [P.O.:820] Out: -    Intake/Output this shift:  No intake/output data recorded.  Physical Exam: General: NAD,   Head: Normocephalic, atraumatic. Moist oral mucosal membranes  Eyes: Anicteric,   Neck: Supple, trachea midline  Lungs:  Clear to auscultation  Heart: Regular rate and rhythm  Abdomen:  Soft, nontender,   Extremities: trace peripheral edema.  Neurologic: Nonfocal, moving all four extremities  Skin: No lesions  Access: Left arm AVF    Basic Metabolic Panel:  Recent Labs Lab 07/23/16 0900  NA 137  K 5.4*  CL 102  CO2 24  GLUCOSE 123*  BUN 88*  CREATININE 10.99*  CALCIUM 8.5*  PHOS 5.3*    Liver Function Tests:  Recent Labs Lab 07/23/16 0900  ALBUMIN 3.9   No results for input(s): LIPASE, AMYLASE in the last 168 hours. No results for input(s): AMMONIA in the last 168 hours.  CBC:  Recent Labs Lab 07/23/16 0900  WBC 5.7  HGB 10.8*  HCT 31.1*  MCV 92.2  PLT 103*    Cardiac Enzymes: No results for input(s): CKTOTAL, CKMB, CKMBINDEX, TROPONINI in the last 168 hours.  BNP: Invalid input(s): POCBNP  CBG: No results for input(s): GLUCAP in the last 168 hours.  Microbiology: Results for orders placed or performed during the hospital encounter of 09/19/15  CULTURE, BLOOD (ROUTINE X 2) w Reflex to PCR ID Panel     Status: None   Collection Time: 09/25/15 11:08  AM  Result Value Ref Range Status   Specimen Description BLOOD LEFT HAND  Final   Special Requests BOTTLES DRAWN AEROBIC AND ANAEROBIC [place]  Final   Culture NO GROWTH 5 DAYS  Final   Report Status 09/30/2015 FINAL  Final  CULTURE, BLOOD (ROUTINE X 2) w Reflex to PCR ID Panel     Status: None   Collection Time: 09/25/15  3:41 PM  Result Value Ref Range Status   Specimen Description BLOOD LINE  Final   Special Requests   Final    BOTTLES DRAWN AEROBIC AND ANAEROBIC AERO 8CC ANA 14CC   Culture NO GROWTH 5 DAYS  Final   Report Status 09/30/2015 FINAL  Final  Wound culture     Status: None   Collection Time: 09/26/15  3:02 PM  Result Value Ref Range Status   Specimen Description WOUND  Final   Special Requests Normal  Final   Gram Stain RARE WBC SEEN NO ORGANISMS SEEN   Final   Culture LIGHT GROWTH STAPHYLOCOCCUS AUREUS  Final   Report Status 09/29/2015 FINAL  Final   Organism ID, Bacteria STAPHYLOCOCCUS AUREUS  Final      Susceptibility   Staphylococcus aureus - MIC*    OXACILLIN Value in next row Sensitive      SENSITIVE<=0.25    GENTAMICIN Value in next row Sensitive  SENSITIVE<=0.5    CIPROFLOXACIN Value in next row Sensitive      SENSITIVE<=0.5    ERYTHROMYCIN Value in next row Sensitive      SENSITIVE<=0.25    CLINDAMYCIN Value in next row Sensitive      SENSITIVE<=0.25    VANCOMYCIN Value in next row Sensitive      SENSITIVE1    TETRACYCLINE Value in next row Sensitive      SENSITIVE<=1    RIFAMPIN Value in next row Sensitive      SENSITIVE<=0.5    TRIMETH/SULFA Value in next row Sensitive      SENSITIVE<=10    * LIGHT GROWTH STAPHYLOCOCCUS AUREUS    Coagulation Studies: No results for input(s): LABPROT, INR in the last 72 hours.  Urinalysis: No results for input(s): COLORURINE, LABSPEC, PHURINE, GLUCOSEU, HGBUR, BILIRUBINUR, KETONESUR, PROTEINUR, UROBILINOGEN, NITRITE, LEUKOCYTESUR in the last 72 hours.  Invalid input(s): APPERANCEUR     Imaging: No results found.   Medications:    . aspirin  81 mg Oral Daily  . calcium acetate  1,334 mg Oral TID WC  . epoetin (EPOGEN/PROCRIT) injection  4,000 Units Intravenous Q T,Th,Sa-HD  . lidocaine-prilocaine   Topical Once  . metoprolol succinate  50 mg Oral Daily  . midodrine  10 mg Oral Q T,Th,Sa-HD  . pantoprazole  40 mg Oral Daily  . QUEtiapine  150 mg Oral QHS  . senna-docusate  1 tablet Oral QHS  . sertraline  50 mg Oral Daily   acetaminophen, alum & mag hydroxide-simeth, magnesium hydroxide, traMADol  Assessment/ Plan:  [name] is a 55 y.o. white male with past medical history of developmental deficit, hypertension, chronic low back pain, scoliosis, cardiac ablation for PSVT, ESRD February 2017.   TTS [facility] [place]  1. End Stage Renal Disease: TTS Dialysis today  2. Hypertension:   - metoprolol- bedtime - gets midodrine before dialysis treatments  3. Anemia of chronic kidney disease:  - Mircera as outpatient.   Low dose EPO  4. Secondary Hyperparathyroidism: outpatient PTH elevated 774. On 07/02/16.  -  calcium acetate with meals.    LOS: 10 Jame Seelig 11/25/201711:44 AM

## 2016-07-27 NOTE — Progress Notes (Signed)
HD initiated without issue using lidocaine prep per patient request.

## 2016-07-28 NOTE — Progress Notes (Signed)
Pt has been pleasant and cooperative . Pt's mood and affect has been less depressive. Pt denies SI and A/V hallucinations. Pt continues to attend all unit activities.

## 2016-07-28 NOTE — BHH Group Notes (Signed)
[facility] LCSW Group Therapy  07/28/2016 2:52 PM  Type of Therapy:  Group Therapy  Participation Level:  Active  Participation Quality:  Attentive  Affect:  Appropriate  Cognitive:  Alert  Insight:  Improving  Engagement in Therapy:  Engaged  Modes of Intervention:  Activity, Clarification, Discussion, Education, Problem-solving, Reality Testing, Socialization and Support  Summary of Progress/Problems:Stress management: Patients defined and discussed the topic of stress and the related symptoms and triggers for stress. Patients identified healthy coping skills they would like to try during hospitalization and after discharge to manage stress in a healthy way. CSW offered insight to varying stress management techniques. Patient discussed interacting more with peers and staff as a way to cope with his social anxiety and stress. CSW and peers offered support to patient. Patient was actively involved in the group discussion.   Billiejean Schimek G. [place], [place] 07/28/2016, 2:53 PM

## 2016-07-28 NOTE — Progress Notes (Signed)
[facility] MD Progress Note  07/28/2016 2:13 PM Ronnie Keith  MRN:  [id number]  Subjective:  Ronnie Keith is a 55 year old male with a history of mild developmental disability, depression, and end-stage kidney disease on dialysis. He was admitted for voicing suicidal ideation at the dialysis Center. He was consulted by nephrology and continues dialysis here.  The patient was up out of his room this morning interacting with peers in the day room. He reported that he wanted to try to be more social after the conversation with this Probation officer yesterday. He did complete dialysis yesterday and denies any anxiety about dialysis today. He was disappointed to have not been able to visit with his mother yesterday because he was in dialysis when she came to visit. He does report low energy level and mild anhedonia but is going to try to push himself to socialize more. He admits to problems with communication and interacting with others. He denies any current active or passive suicidal thoughts or psychotic symptoms. He denies any auditory or visual hallucinations. No paranoid thoughts or delusions. The patient is eating fairly well. He slept over 6 hours last night. Vital signs have been stable. The patient has been compliant with the Seroquel and the Zoloft. So far no physical adverse side effects associated with psychotropic medications.  Supportive psychotherapy provided and Times spent helping the patient to try and role play and improve interpersonal skills with others. He was encouraged to develop a support system outside of his house and his mother and continue to increase social interactions with others. Times spent encouraging him to go to groups today to practice developing social skills.   Past psychiatric history. Apparently he has never been hospitalized or attempted suicide. He was started on Remeron at that time he dialysis begun. There is mild developmental disability.  Family psychiatric history. He denies any  history of any mental illness or substance use in the family  Social history. The patient is disabled he lives with his mother who is very supportive. The patient functions well at baseline. He is able to take care of his needs. He drives a car. He does have decision-making capacity.He has never been married and has no children  Substance Abuse History: He denies any history of any heavy alcohol use or illicit drug use in the past.      .Principal Problem: Major depressive disorder, recurrent severe without psychotic features ([place]) Diagnosis:   Patient Active Problem List   Diagnosis Date Noted  . Chronic kidney disease [N18.9] 07/17/2016  . Intellectual disability [F79] 07/16/2016  . Major depressive disorder, recurrent severe without psychotic features ([place]) [F33.2] 09/28/2015  . Hyperkalemia [E87.5] 09/20/2015   Total Time spent with patient: 20 minutes  Past Psychiatric History: Depression.  Past Medical History:  Past Medical History:  Diagnosis Date  . Anxiety   . Chronic kidney disease   . Chronic low back pain   . Hypertension   . Sciatica   . Scoliosis     Past Surgical History:  Procedure Laterality Date  . CLEFT PALATE REPAIR    . EYE SURGERY     "when I was small"  . PERIPHERAL VASCULAR CATHETERIZATION N/A 09/29/2015   Procedure: Dialysis/Perma Catheter Insertion;  Surgeon: Katha Cabal, MD;  Location: [facility];  Service: Cardiovascular;  Laterality: N/A;  . ROTATOR CUFF REPAIR Left    Family History:  Family History  Problem Relation Age of Onset  . Hypertension Other    Family  Psychiatric  History: See H&P.  Social History:  History  Alcohol Use No     History  Drug Use No    Social History   Social History  . Marital status: Single    Spouse name: N/A  . Number of children: N/A  . Years of education: N/A   Social History Main Topics  . Smoking status: Never Smoker  . Smokeless tobacco: Never Used  . Alcohol use No  .  Drug use: No  . Sexual activity: Not Currently   Other Topics Concern  . None   Social History Narrative  . None   Additional Social History:    Pain Medications: see PTA meds Prescriptions: see PTA meds Over the Counter: see PTA meds History of alcohol / drug use?: No history of alcohol / drug abuse                    Sleep: Fair  Appetite:  Good  Current Medications: Current Facility-Administered Medications  Medication Dose Route Frequency Provider Last Rate Last Dose  . acetaminophen (TYLENOL) tablet 650 mg  650 mg Oral Q6H PRN Gonzella Lex, MD   650 mg at 07/23/16 1558  . alum & mag hydroxide-simeth (MAALOX/MYLANTA) 200-200-20 MG/5ML suspension 30 mL  30 mL Oral Q4H PRN Gonzella Lex, MD      . aspirin chewable tablet 81 mg  81 mg Oral Daily Jolanta B Pucilowska, MD   81 mg at 07/28/16 0816  . calcium acetate (PHOSLO) capsule 1,334 mg  1,334 mg Oral TID WC Lavonia Dana, MD   1,334 mg at 07/28/16 1254  . epoetin alfa (EPOGEN,PROCRIT) injection 4,000 Units  4,000 Units Intravenous Q T,Th,Sa-HD Murlean Iba, MD   4,000 Units at 07/27/16 1821  . lidocaine-prilocaine (EMLA) cream   Topical Once Lavonia Dana, MD      . magnesium hydroxide (MILK OF MAGNESIA) suspension 30 mL  30 mL Oral Daily PRN Gonzella Lex, MD      . metoprolol succinate (TOPROL-XL) 24 hr tablet 50 mg  50 mg Oral QHS Harmeet Singh, MD      . midodrine (PROAMATINE) tablet 10 mg  10 mg Oral Q T,Th,Sa-HD Murlean Iba, MD   10 mg at 07/27/16 0925  . pantoprazole (PROTONIX) EC tablet 40 mg  40 mg Oral Daily Gonzella Lex, MD   40 mg at 07/28/16 0644  . QUEtiapine (SEROQUEL) tablet 150 mg  150 mg Oral QHS Chauncey Mann, MD   150 mg at 07/27/16 2159  . senna-docusate (Senokot-S) tablet 1 tablet  1 tablet Oral QHS Clovis Fredrickson, MD   1 tablet at 07/27/16 2159  . sertraline (ZOLOFT) tablet 50 mg  50 mg Oral Daily Chauncey Mann, MD   50 mg at 07/28/16 0817  . traMADol (ULTRAM) tablet 50 mg  50  mg Oral Q6H PRN Clovis Fredrickson, MD   50 mg at 07/27/16 2328    Lab Results:  Results for orders placed or performed during the hospital encounter of 07/17/16 (from the past 48 hour(s))  Basic metabolic panel     Status: Abnormal   Collection Time: 07/27/16  3:30 PM  Result Value Ref Range   Sodium 136 135 - 145 mmol/L   Potassium 5.6 (H) 3.5 - 5.1 mmol/L   Chloride 101 101 - 111 mmol/L   CO2 25 22 - 32 mmol/L   Glucose, Bld 96 65 - 99 mg/dL   BUN 64 (H)  6 - 20 mg/dL   Creatinine, Ser 8.97 (H) 0.61 - 1.24 mg/dL   Calcium 9.1 8.9 - 10.3 mg/dL   GFR calc non Af Amer 6 (L) >60 mL/min   GFR calc Af Amer 7 (L) >60 mL/min    Comment: (NOTE) The eGFR has been calculated using the CKD EPI equation. This calculation has not been validated in all clinical situations. eGFR's persistently <60 mL/min signify possible Chronic Kidney Disease.    Anion gap 10 5 - 15  Phosphorus     Status: None   Collection Time: 07/27/16  3:30 PM  Result Value Ref Range   Phosphorus 3.7 2.5 - 4.6 mg/dL    Blood Alcohol level:  Lab Results  Component Value Date   ETH <5 16/60/6301    Metabolic Disorder Labs: Lab Results  Component Value Date   HGBA1C 5.2 07/16/2016   MPG 103 07/16/2016   No results found for: PROLACTIN No results found for: CHOL, TRIG, HDL, CHOLHDL, VLDL, LDLCALC  Physical Findings: AIMS: Facial and Oral Movements Muscles of Facial Expression: None, normal Lips and Perioral Area: None, normal Jaw: None, normal Tongue: None, normal,Extremity Movements Upper (arms, wrists, hands, fingers): None, normal Lower (legs, knees, ankles, toes): None, normal, Trunk Movements Neck, shoulders, hips: None, normal, Overall Severity Severity of abnormal movements (highest score from questions above): None, normal Incapacitation due to abnormal movements: None, normal Patient's awareness of abnormal movements (rate only patient's report): No Awareness, Dental Status Current problems  with teeth and/or dentures?: No Does patient usually wear dentures?: No  CIWA:    COWS:     Musculoskeletal: Strength & Muscle Tone: Decreased Gait & Station: unsteady; uses a walker Patient leans: N/A  Psychiatric Specialty Exam: Physical Exam  Nursing note and vitals reviewed.   Review of Systems  Constitutional: Positive for malaise/fatigue. Negative for chills, fever and weight loss.  HENT: Negative.  Negative for congestion, ear discharge, ear pain, hearing loss, nosebleeds, sinus pain, sore throat and tinnitus.   Eyes: Negative.  Negative for blurred vision, double vision, photophobia, pain, discharge and redness.  Respiratory: Negative.  Negative for cough, hemoptysis, sputum production, shortness of breath and wheezing.   Cardiovascular: Negative.  Negative for chest pain, palpitations, orthopnea, claudication, leg swelling and PND.  Gastrointestinal: Negative.  Negative for abdominal pain, blood in stool, constipation, diarrhea, heartburn, melena, nausea and vomiting.  Genitourinary: Negative.  Negative for dysuria, frequency and urgency.  Musculoskeletal: Negative.  Negative for back pain, joint pain, myalgias and neck pain.  Skin: Negative.  Negative for itching and rash.  Neurological: Positive for weakness. Negative for dizziness, tingling, tremors and headaches.       He is ambulating with a walker  Endo/Heme/Allergies: Negative.  Does not bruise/bleed easily.    Blood pressure 129/83, pulse 95, temperature 97.7 F (36.5 C), temperature source Oral, resp. rate 20, height 6' 1" (1.854 m), weight 115.8 kg (255 lb 4.7 oz), SpO2 100 %.Body mass index is 33.68 kg/m.  General Appearance: Casual  Eye Contact:  Good  Speech:  Clear and Coherent  Volume:  Normal  Mood:  " A little better"  Affect:  Improved, able to smile a little  Thought Process:  Goal Directed and Descriptions of Associations: Intact  Orientation:  Full (Time, Place, and Person)  Thought Content:  WDL   Suicidal Thoughts:  Yes.  with intent/plan  Homicidal Thoughts:  No  Memory:  Immediate;   Fair Recent;   Fair Remote;   Fair  Judgement:  Impaired  Insight:  Shallow  Psychomotor Activity:  Psychomotor Retardation  Concentration:  Concentration: Fair and Attention Span: Fair  Recall:  [employer] of Knowledge:  Fair  Language:  Fair  Akathisia:  No  Handed:  Right  AIMS (if indicated):     Assets:  Communication Skills Desire for Improvement Financial Resources/Insurance Housing Resilience Social Support  ADL's:  Intact  Cognition:  WNL  Sleep:  Number of Hours: 6.15     Treatment Plan Summary:  Major depressive disorder, severe, recurrent without psychotic features Intellectual disability  Chronic kidney disease   Daily contact with patient to assess and evaluate symptoms and progress in treatment and Medication management   Ronnie Keith is a 55 year old male with a history of developmental disability, depression, and chronic kidney disease on dialysis admitted for suicidal ideation.  1. Suicidal ideation. The patient is able to contract for safety in the hospital.  2. Mood: The patient is reporting depressive symptoms.He was started on Zoloft 50 mg by mouth daily for anxiety and depression. He was started on Seroquel 150 mg by mouth nightly for depression and insomnia by Dr Gretel Acre. He failed Trazodone and Temazepam. He most likely needs a sleep study to rule out sleep apnea It is not clear that he needs a mood stabilizer. He has never been hospitalized in the past  4. GERD. He is on Protonix 40 mg by mouth daily  5. Chronic kidney disease. Nephrology consult is greatly appreciated. The patient will continue dialysis.  6. Hypertension.: Blood pressures slightly on the elevated side. We'll continue Toprol-XL 50 mg by mouth daily  7. Chronic back pain/Leg pain. : The patient does ambulate with a walker. We'll continue tramadol 50 mg by mouth every 6 hours when  necessary for pain   8. Disposition. He will be discharged back with his mother. He will follow up with his regular psychiatrist    Jay Schlichter, MD 07/28/2016, 2:13 PM

## 2016-07-29 MED ORDER — TRAMADOL HCL 50 MG PO TABS: 50.0000 mg | ORAL_TABLET | Freq: Four times a day (QID) | ORAL | 0 refills | Status: DC | PRN

## 2016-07-29 MED ORDER — CALCIUM ACETATE (PHOS BINDER) 667 MG PO CAPS: 1334.0000 mg | ORAL_CAPSULE | Freq: Three times a day (TID) | ORAL | 1 refills | Status: AC

## 2016-07-29 MED ORDER — LIDOCAINE-PRILOCAINE 2.5-2.5 % EX CREA: TOPICAL_CREAM | Freq: Once | CUTANEOUS | 0 refills | Status: AC

## 2016-07-29 MED ORDER — ROPINIROLE HCL 2 MG PO TABS: 2.0000 mg | ORAL_TABLET | Freq: Every day | ORAL | 1 refills | Status: DC

## 2016-07-29 MED ORDER — ROPINIROLE HCL 1 MG PO TABS
2.0000 mg | ORAL_TABLET | Freq: Every day | ORAL | Status: DC
  Filled 2016-07-29: qty 2

## 2016-07-29 MED ORDER — BUPROPION HCL ER (XL) 150 MG PO TB24
150.0000 mg | ORAL_TABLET | Freq: Every day | ORAL | Status: DC
  Administered 2016-07-29: 150 mg via ORAL
  Filled 2016-07-29: qty 1

## 2016-07-29 MED ORDER — QUETIAPINE FUMARATE 200 MG PO TABS: 200.0000 mg | ORAL_TABLET | Freq: Every day | ORAL | 1 refills | Status: AC

## 2016-07-29 MED ORDER — BUPROPION HCL ER (XL) 150 MG PO TB24: 150.0000 mg | ORAL_TABLET | Freq: Every day | ORAL | 1 refills | Status: AC

## 2016-07-29 MED ORDER — QUETIAPINE FUMARATE 200 MG PO TABS: 200.0000 mg | ORAL_TABLET | Freq: Every day | ORAL | Status: DC

## 2016-07-29 NOTE — Tx Team (Signed)
Interdisciplinary Treatment and Diagnostic Plan Update  07/29/2016 Time of Session: 10:30am Ronnie Keith MRN: [id number]  Principal Diagnosis: Major depressive disorder, recurrent severe without psychotic features ([place])  Secondary Diagnoses: Principal Problem:   Major depressive disorder, recurrent severe without psychotic features ([place]) Active Problems:   Intellectual disability   Chronic kidney disease   Current Medications:  Current Facility-Administered Medications  Medication Dose Route Frequency Provider Last Rate Last Dose  . acetaminophen (TYLENOL) tablet 650 mg  650 mg Oral Q6H PRN Gonzella Lex, MD   650 mg at 07/29/16 0831  . alum & mag hydroxide-simeth (MAALOX/MYLANTA) 200-200-20 MG/5ML suspension 30 mL  30 mL Oral Q4H PRN Gonzella Lex, MD      . aspirin chewable tablet 81 mg  81 mg Oral Daily Jolanta B Pucilowska, MD   81 mg at 07/29/16 0830  . buPROPion (WELLBUTRIN XL) 24 hr tablet 150 mg  150 mg Oral Daily Jolanta B Pucilowska, MD   150 mg at 07/29/16 0830  . calcium acetate (PHOSLO) capsule 1,334 mg  1,334 mg Oral TID WC Lavonia Dana, MD   1,334 mg at 07/29/16 1209  . epoetin alfa (EPOGEN,PROCRIT) injection 4,000 Units  4,000 Units Intravenous Q T,Th,Sa-HD Murlean Iba, MD   4,000 Units at 07/27/16 1821  . lidocaine-prilocaine (EMLA) cream   Topical Once Lavonia Dana, MD      . magnesium hydroxide (MILK OF MAGNESIA) suspension 30 mL  30 mL Oral Daily PRN Gonzella Lex, MD      . metoprolol succinate (TOPROL-XL) 24 hr tablet 50 mg  50 mg Oral QHS Murlean Iba, MD   50 mg at 07/28/16 2152  . midodrine (PROAMATINE) tablet 10 mg  10 mg Oral Q T,Th,Sa-HD Murlean Iba, MD   10 mg at 07/27/16 0925  . pantoprazole (PROTONIX) EC tablet 40 mg  40 mg Oral Daily Gonzella Lex, MD   40 mg at 07/29/16 0830  . QUEtiapine (SEROQUEL) tablet 200 mg  200 mg Oral QHS Jolanta B Pucilowska, MD      . rOPINIRole (REQUIP) tablet 2 mg  2 mg Oral QHS Jolanta B Pucilowska, MD      .  senna-docusate (Senokot-S) tablet 1 tablet  1 tablet Oral QHS Clovis Fredrickson, MD   1 tablet at 07/28/16 2152  . traMADol (ULTRAM) tablet 50 mg  50 mg Oral Q6H PRN Clovis Fredrickson, MD   50 mg at 07/28/16 2152   PTA Medications: Prescriptions Prior to Admission  Medication Sig Dispense Refill Last Dose  . acetaminophen (TYLENOL) 325 MG tablet Take 650 mg by mouth every 6 (six) hours as needed for mild pain or moderate pain.   Past Week at Unknown time  . aspirin EC 81 MG tablet Take 81 mg by mouth daily.   07/16/2016 at Unknown time  . fluticasone (FLONASE) 50 MCG/ACT nasal spray Place 1 spray into both nostrils daily.   07/16/2016 at Unknown time  . gabapentin (NEURONTIN) 300 MG capsule Take 1 capsule (300 mg total) by mouth 2 (two) times daily. 60 capsule 2 07/16/2016 at Unknown time  . MELATONIN PO Take 1 tablet by mouth daily.   07/16/2016 at Unknown time  . metoprolol succinate (TOPROL-XL) 50 MG 24 hr tablet Take 50 mg by mouth daily.   07/16/2016 at Unknown time  . mirtazapine (REMERON) 30 MG tablet Take 1 tablet (30 mg total) by mouth at bedtime. 30 tablet 2 07/16/2016 at Unknown time  . omeprazole (PRILOSEC) 40  MG capsule Take 40 mg by mouth daily.   07/16/2016 at Unknown time  . oxyCODONE (OXY IR/ROXICODONE) 5 MG immediate release tablet Take 1 tablet (5 mg total) by mouth every 6 (six) hours as needed for moderate pain. 20 tablet 0 Past Week at Unknown time  . senna-docusate (SENOKOT-S) 8.6-50 MG tablet Take 2 tablets by mouth 2 (two) times daily. 60 tablet 2 Past Month at Unknown time  . ALPRAZolam (XANAX) 1 MG tablet Take 1 tablet (1 mg total) by mouth 2 (two) times daily as needed for anxiety. (Patient not taking: Reported on 07/17/2016) 20 tablet 0 Not Taking at Unknown time    Patient Stressors: Health problems Occupational concerns  Patient Strengths: Armed forces logistics/support/administrative officer Supportive family/friends  Treatment Modalities: Medication Management, Group therapy, Case  management,  1 to 1 session with clinician, Psychoeducation, Recreational therapy.   Physician Treatment Plan for Primary Diagnosis: Major depressive disorder, recurrent severe without psychotic features ([place]) Long Term Goal(s): Improvement in symptoms so as ready for discharge NA   Short Term Goals: Ability to identify changes in lifestyle to reduce recurrence of condition will improve Ability to verbalize feelings will improve Ability to disclose and discuss suicidal ideas Ability to demonstrate self-control will improve Ability to identify and develop effective coping behaviors will improve Ability to maintain clinical measurements within normal limits will improve Compliance with prescribed medications will improve NA  Medication Management: Evaluate patient's response, side effects, and tolerance of medication regimen.  Therapeutic Interventions: 1 to 1 sessions, Unit Group sessions and Medication administration.  Evaluation of Outcome: Adequate for discharge  Physician Treatment Plan for Secondary Diagnosis: Principal Problem:   Major depressive disorder, recurrent severe without psychotic features ([place]) Active Problems:   Intellectual disability   Chronic kidney disease  Long Term Goal(s): Improvement in symptoms so as ready for discharge NA   Short Term Goals: Ability to identify changes in lifestyle to reduce recurrence of condition will improve Ability to verbalize feelings will improve Ability to disclose and discuss suicidal ideas Ability to demonstrate self-control will improve Ability to identify and develop effective coping behaviors will improve Ability to maintain clinical measurements within normal limits will improve Compliance with prescribed medications will improve NA     Medication Management: Evaluate patient's response, side effects, and tolerance of medication regimen.  Therapeutic Interventions: 1 to 1 sessions, Unit Group sessions and Medication  administration.  Evaluation of Outcomes: Adequate for discharge   RN Treatment Plan for Primary Diagnosis: Major depressive disorder, recurrent severe without psychotic features ([place]) Long Term Goal(s): Knowledge of disease and therapeutic regimen to maintain health will improve  Short Term Goals: Ability to remain free from injury will improve, Ability to verbalize frustration and anger appropriately will improve, Ability to participate in decision making will improve, Ability to verbalize feelings will improve and Ability to identify and develop effective coping behaviors will improve  Medication Management: RN will administer medications as ordered by provider, will assess and evaluate patient's response and provide education to patient for prescribed medication. RN will report any adverse and/or side effects to prescribing provider.  Therapeutic Interventions: 1 on 1 counseling sessions, Psychoeducation, Medication administration, Evaluate responses to treatment, Monitor vital signs and CBGs as ordered, Perform/monitor CIWA, COWS, AIMS and Fall Risk screenings as ordered, Perform wound care treatments as ordered.  Evaluation of Outcomes: Adequate for discharge   LCSW Treatment Plan for Primary Diagnosis: Major depressive disorder, recurrent severe without psychotic features ([place]) Long Term Goal(s): Safe transition to appropriate next  level of care at discharge, Engage patient in therapeutic group addressing interpersonal concerns.  Short Term Goals: Engage patient in aftercare planning with referrals and resources, Increase social support, Increase ability to appropriately verbalize feelings, Increase emotional regulation and Facilitate acceptance of mental health diagnosis and concerns  Therapeutic Interventions: Assess for all discharge needs, 1 to 1 time with Social worker, Explore available resources and support systems, Assess for adequacy in community support network, Educate family  and significant other(s) on suicide prevention, Complete Psychosocial Assessment, Interpersonal group therapy.  Evaluation of Outcomes: Adequate for discharge   Progress in Treatment: Attending groups: Yes, Intermittently Participating in groups: Intermittently Taking medication as prescribed: No. Toleration medication: No. Family/Significant other contact made: No, will contact:  CSW assessing for appropriate contacts   Patient understands diagnosis: Yes. Discussing patient identified problems/goals with staff: Yes. Medical problems stabilized or resolved: Yes. Denies suicidal/homicidal ideation: Yes. Issues/concerns per patient self-inventory: No. Other: none reported   New problem(s) identified: No, Describe:  none reported  New Short Term/Long Term Goal(s): None reported  Discharge Plan or Barriers: Pt will discharge home to [place] to live with his family and will follow up with [facility] for primary doctor hospital follow up, with [facility] for medication management, assessment for Day Program for people with developmental disabilities and therapy and with [facility] for peer support services. Pt has also been referred to [facility] for possible admittance into their day program and advocacy services for the developmentally disabled.  Reason for Continuation of Hospitalization: Anxiety Depression  Estimated Length of Stay: 3-5 days  Attendees: Patient: Pt was undergoing ECT and unable to attend tx team 07/23/2016 10:56 AM  Physician: Dr. Bary Leriche, MD 07/23/2016 10:56 AM  Nursing: Polly Cobia, RN 07/23/2016 10:56 AM  RN Care Manager: 07/23/2016 10:56 AM  Social Worker: Alphonse Guild. Daine Gravel, LCAS 07/23/2016 10:56 AM  Recreational Therapist: Everitt Amber, LRT 07/23/2016 10:56 AM  Social Worker:  07/23/2016 10:56 AM  Other:  07/23/2016 10:56 AM  Other: 07/23/2016 10:56 AM    Scribe for Treatment Team: Alphonse Guild  Oneill Bais, LCSWA 07/23/2016  Updated by Alphonse Guild. Donald Memoli, LCSWA, LCAS   07/29/16

## 2016-07-29 NOTE — Progress Notes (Signed)
  [facility] Adult Case Management Discharge Plan :  Will you be returning to the same living situation after discharge:  Yes,  pt will return to [place] to live with his mother and sister At discharge, do you have transportation home?: Yes,  pt will be picked up by his mother Do you have the ability to pay for your medications: Yes,  pt will be provided with prescriptions at discharge  Release of information consent forms completed and in the chart;  Patient's signature needed at discharge.  Patient to Follow up at: Follow-up Information    [facility] Group Follow up.   Why:  Please arrive for your hospital follow up appointment with your physician at 1:20pm on Monday December 4th,2017 Contact information: [place] [address] [place], [place] [address] Phone: [phone] Fax: [phone]       [facility]. Go to.   Why:  Please arrive to the walk-in clinic on any day Monday thru Friday between 8am-4pm for your assessment for developmental disability services and assistance, for day program enrollment, and therapy.  Arrive as early as possible for prompt service.  Contact information: [facility] [address] [place], [place] [address] Phone: [phone] Fax: [phone]       [facility] Follow up.   Why:  You have been referred to the [facility] for an assessment for a higher-level of care and assistance for developmental disabilities services and a possible day program for clients who would like to socialize more and interact with others. Contact information: Community education officer, [employer]. [facility] [address] [place], [place] [address] Phone: [phone] Fax: [phone] Email: [email or web address]       [facility] Follow up.   Why:  You have been referred for in-home peer support  from qualified professionals who will visit you in your home to assist you with needs, services and medication directions, as well as therapy.   Contact information: [place]  [facility] [address] [place], [place] [address] Phone: [phone] Fax: [phone]          Next level of care provider has access to [place] and Suicide Prevention discussed: Yes,  completed with pt  Have you used any form of tobacco in the last 30 days? (Cigarettes, Smokeless Tobacco, Cigars, and/or Pipes): No  Has patient been referred to the Quitline?: N/A patient is not a smoker  Patient has been referred for addiction treatment: N/A  Claudine Mouton 07/29/2016, 4:21 PM

## 2016-07-29 NOTE — Progress Notes (Addendum)
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 He was admitted for voicing suicidal ideation at the dialysis Center. He was consulted by nephrology and continues dialysis here.  Ronnie Keith seems much improved today. He is out of his room, walking around using a walker interacting with peers and staff. He complains of insomnia and restless legs. His mood is improving. He denies anxiety but worries about the future. This includes short term worries about discharge as well as worries about the future when his mother, who is 55 years old, is no longer here. He has a sister who would be in charge. He tolerates medications well. He has been given Tramadol in the hospital for back and leg pain but it is unclear who would continue to prescribe in the community.  Principal Problem: Major depressive disorder, recurrent severe without psychotic features ([place]) Diagnosis:   Patient Active Problem List   Diagnosis Date Noted  . Chronic kidney disease [N18.9] 07/17/2016  . Intellectual disability [F79] 07/16/2016  . Major depressive disorder, recurrent severe without psychotic features ([place]) [F33.2] 09/28/2015  . Hyperkalemia [E87.5] 09/20/2015   Total Time spent with patient: 20 minutes  Past Psychiatric History: Depression.  Past Medical History:  Past Medical History:  Diagnosis Date  . Anxiety   . Chronic kidney disease   . Chronic low back pain   . Hypertension   . Sciatica   . Scoliosis     Past Surgical History:  Procedure Laterality Date  . CLEFT PALATE REPAIR    . EYE SURGERY     "when I was small"  . PERIPHERAL VASCULAR CATHETERIZATION N/A 09/29/2015   Procedure: Dialysis/Perma Catheter Insertion;  Surgeon: Katha Cabal, MD;  Location: [facility];  Service: Cardiovascular;  Laterality: N/A;  . ROTATOR  CUFF REPAIR Left    Family History:  Family History  Problem Relation Age of Onset  . Hypertension Other    Family Psychiatric  History: See H&P.  Social History:  History  Alcohol Use No     History  Drug Use No    Social History   Social History  . Marital status: Single    Spouse name: N/A  . Number of children: N/A  . Years of education: N/A   Social History Main Topics  . Smoking status: Never Smoker  . Smokeless tobacco: Never Used  . Alcohol use No  . Drug use: No  . Sexual activity: Not Currently   Other Topics Concern  . None   Social History Narrative  . None   Additional Social History:    Pain Medications: see PTA meds Prescriptions: see PTA meds Over the Counter: see PTA meds History of alcohol / drug use?: No history of alcohol / drug abuse                    Sleep: Poor  Appetite:  Good  Current Medications: Current Facility-Administered Medications  Medication Dose Route Frequency Provider Last Rate Last Dose  . acetaminophen (TYLENOL) tablet 650 mg  650 mg Oral Q6H PRN Gonzella Lex, MD   650 mg at 07/29/16 0831  . alum & mag hydroxide-simeth (MAALOX/MYLANTA) 200-200-20 MG/5ML suspension 30 mL  30 mL Oral Q4H PRN Gonzella Lex, MD      . aspirin chewable tablet 81 mg  81 mg Oral Daily Alfonzia Woolum B Leahna Hewson, MD   81 mg at 07/29/16 0830  . buPROPion (WELLBUTRIN XL) 24 hr tablet 150 mg  150 mg Oral Daily Katherinne Mofield B Ryla Cauthon, MD   150 mg at 07/29/16 0830  . calcium acetate (PHOSLO) capsule 1,334 mg  1,334 mg Oral TID WC Lavonia Dana, MD   1,334 mg at 07/29/16 1209  . epoetin alfa (EPOGEN,PROCRIT) injection 4,000 Units  4,000 Units Intravenous Q T,Th,Sa-HD Murlean Iba, MD   4,000 Units at 07/27/16 1821  . lidocaine-prilocaine (EMLA) cream   Topical Once Lavonia Dana, MD      . magnesium hydroxide (MILK OF MAGNESIA) suspension 30 mL  30 mL Oral Daily PRN Gonzella Lex, MD      . metoprolol succinate (TOPROL-XL) 24 hr tablet 50 mg   50 mg Oral QHS Murlean Iba, MD   50 mg at 07/28/16 2152  . midodrine (PROAMATINE) tablet 10 mg  10 mg Oral Q T,Th,Sa-HD Murlean Iba, MD   10 mg at 07/27/16 0925  . pantoprazole (PROTONIX) EC tablet 40 mg  40 mg Oral Daily Gonzella Lex, MD   40 mg at 07/29/16 0830  . QUEtiapine (SEROQUEL) tablet 150 mg  150 mg Oral QHS Chauncey Mann, MD   150 mg at 07/28/16 2151  . senna-docusate (Senokot-S) tablet 1 tablet  1 tablet Oral QHS Clovis Fredrickson, MD   1 tablet at 07/28/16 2152  . traMADol (ULTRAM) tablet 50 mg  50 mg Oral Q6H PRN Clovis Fredrickson, MD   50 mg at 07/28/16 2152    Lab Results:  Results for orders placed or performed during the hospital encounter of 07/17/16 (from the past 48 hour(s))  Basic metabolic panel     Status: Abnormal   Collection Time: 07/27/16  3:30 PM  Result Value Ref Range   Sodium 136 135 - 145 mmol/L   Potassium 5.6 (H) 3.5 - 5.1 mmol/L   Chloride 101 101 - 111 mmol/L   CO2 25 22 - 32 mmol/L   Glucose, Bld 96 65 - 99 mg/dL   BUN 64 (H) 6 - 20 mg/dL   Creatinine, Ser 8.97 (H) 0.61 - 1.24 mg/dL   Calcium 9.1 8.9 - 10.3 mg/dL   GFR calc non Af Amer 6 (L) >60 mL/min   GFR calc Af Amer 7 (L) >60 mL/min    Comment: (NOTE) The eGFR has been calculated using the CKD EPI equation. This calculation has not been validated in all clinical situations. eGFR's persistently <60 mL/min signify possible Chronic Kidney Disease.    Anion gap 10 5 - 15  Phosphorus     Status: None   Collection Time: 07/27/16  3:30 PM  Result Value Ref Range   Phosphorus 3.7 2.5 - 4.6 mg/dL    Blood Alcohol level:  Lab Results  Component Value Date   ETH <5 16/06/9603    Metabolic Disorder Labs: Lab Results  Component Value Date   HGBA1C 5.2 07/16/2016   MPG 103 07/16/2016   No results found for: PROLACTIN No results found for: CHOL, TRIG, HDL, CHOLHDL, VLDL, LDLCALC  Physical Findings: AIMS: Facial and Oral Movements Muscles of Facial Expression: None,  normal Lips and Perioral Area: None, normal Jaw: None, normal Tongue: None, normal,Extremity Movements Upper (arms, wrists, hands, fingers): None, normal Lower (legs, knees, ankles, toes): None, normal, Trunk Movements Neck, shoulders, hips: None, normal, Overall Severity Severity of abnormal movements (highest score from questions above): None, normal Incapacitation  due to abnormal movements: None, normal Patient's awareness of abnormal movements (rate only patient's report): No Awareness, Dental Status Current problems with teeth and/or dentures?: No Does patient usually wear dentures?: No  CIWA:    COWS:     Musculoskeletal: Strength & Muscle Tone: Decreased Gait & Station: unsteady; uses a walker Patient leans: N/A  Psychiatric Specialty Exam: Physical Exam  Nursing note and vitals reviewed.   Review of Systems  Constitutional: Positive for malaise/fatigue.  Neurological: Positive for weakness.       He is ambulating with a walker    Blood pressure 125/75, pulse 81, temperature 97.7 F (36.5 C), temperature source Oral, resp. rate 18, height '6\' 1"'  (1.854 m), weight 115.8 kg (255 lb 4.7 oz), SpO2 100 %.Body mass index is 33.68 kg/m.  General Appearance: Casual  Eye Contact:  Good  Speech:  Clear and Coherent  Volume:  Normal  Mood:  " A little better"  Affect:  Improved, able to smile a little  Thought Process:  Goal Directed and Descriptions of Associations: Intact  Orientation:  Full (Time, Place, and Person)  Thought Content:  WDL  Suicidal Thoughts:  No  Homicidal Thoughts:  No  Memory:  Immediate;   Fair Recent;   Fair Remote;   Fair  Judgement:  Impaired  Insight:  Shallow  Psychomotor Activity:  Psychomotor Retardation  Concentration:  Concentration: Fair and Attention Span: Fair  Recall:  [employer] of Knowledge:  Fair  Language:  Fair  Akathisia:  No  Handed:  Right  AIMS (if indicated):     Assets:  Communication Skills Desire for  Improvement Financial Resources/Insurance Housing Resilience Social Support  ADL's:  Intact  Cognition:  WNL  Sleep:  Number of Hours: 5     Treatment Plan Summary:  Daily contact with patient to assess and evaluate symptoms and progress in treatment and Medication management   Ronnie Keith is a 55 year old male with a history of developmental disability, depression, and chronic kidney disease on dialysis admitted for suicidal ideation.  1. Suicidal ideation. Resolved. The patient is able to contract for safety.   2. Mood. We continue Seroquel but increase it to 200 mg nightly and Wellbutrin for depression.   3. Insomnia. This improved with Seroquel.  4. GERD. He is on Protonix 40 mg by mouth daily  5. Chronic kidney disease. Nephrology consult is greatly appreciated. The patient will continue dialysis.  6. Hypertension. We continue Toprol-XL 50 mg by mouth daily  7. Chronic back pain/Leg pain. The patient does ambulate with a walker. We'll continue tramadol 50 mg by mouth every 6 hours when necessary for pain. Will start Requip for restless legs.   8. Disposition. He will be discharged back with his mother. He will follow up with his regular psychiatrist  I certify that the services received since the previous certification/recertification were and continue to be medically necessary as the treatment provided can be reasonably expected to improve the patient's condition; the medical record documents that the services furnished were intensive treatment services or their equivalent services, and this patient continues to need, on a daily basis, active treatment furnished directly by or requiring the supervision of inpatient psychiatric personnel.   Orson Slick, MD 07/29/2016, 12:30 PM

## 2016-07-29 NOTE — [facility] Suicide Risk Assessment (Signed)
[facility] Discharge Suicide Risk Assessment   Principal Problem: Major depressive disorder, recurrent severe without psychotic features [facility]) Discharge Diagnoses:  Patient Active Problem List   Diagnosis Date Noted  . Chronic kidney disease [N18.9] 07/17/2016  . Intellectual disability [F79] 07/16/2016  . Major depressive disorder, recurrent severe without psychotic features ([place]) [F33.2] 09/28/2015  . Hyperkalemia [E87.5] 09/20/2015    Total Time spent with patient: 30 minutes  Musculoskeletal: Strength & Muscle Tone: within normal limits Gait & Station: unsteady, uses a walker. Patient leans: N/A  Psychiatric Specialty Exam: Review of Systems  Psychiatric/Behavioral: The patient is nervous/anxious and has insomnia.   All other systems reviewed and are negative.   Blood pressure 125/75, pulse 81, temperature 97.7 F (36.5 C), temperature source Oral, resp. rate 18, height 6\' 1"  (1.854 m), weight 115.8 kg (255 lb 4.7 oz), SpO2 100 %.Body mass index is 33.68 kg/m.  General Appearance: Casual  Eye Contact::  Good  Speech:  Clear and [id number]  Volume:  Normal  Mood:  Anxious  Affect:  Appropriate  Thought Process:  Goal Directed  Orientation:  Full (Time, Place, and Person)  Thought Content:  WDL  Suicidal Thoughts:  No  Homicidal Thoughts:  No  Memory:  Immediate;   Fair Recent;   Fair Remote;   Fair  Judgement:  Impaired  Insight:  Shallow  Psychomotor Activity:  Normal  Concentration:  Fair  Recall:  [place]  Language: Fair  Akathisia:  No  Handed:  Right  AIMS (if indicated):     Assets:  Communication Skills Desire for Improvement Financial Resources/Insurance Housing Resilience Social Support Transportation  Sleep:  Number of Hours: 5  Cognition: WNL  ADL's:  Intact   Mental Status Per Nursing Assessment::   On Admission:  Self-harm thoughts  Demographic Factors:  Male and Caucasian  Loss Factors: Decline in physical  health  Historical Factors: Impulsivity  Risk Reduction Factors:   Sense of responsibility to family, Living with another person, especially a relative, Positive social support and Positive therapeutic relationship  Continued Clinical Symptoms:  Depression:   Impulsivity Insomnia Medical Diagnoses and Treatments/Surgeries  Cognitive Features That Contribute To Risk:  None    Suicide Risk:  Minimal: No identifiable suicidal ideation.  Patients presenting with no risk factors but with morbid ruminations; may be classified as minimal risk based on the severity of the depressive symptoms    Plan Of Care/Follow-up recommendations:  Activity:  as tolerated. Diet:  low sodium heart healthy renal diet. Other:  keep follow up appointments.  Orson Slick, MD 07/29/2016, 12:58 PM

## 2016-07-29 NOTE — Progress Notes (Signed)
Provided and reviewed discharge paperwork and prescriptions. Verified understanding by use of teach back method. Pt continues to be anxious regarding returning to dialysis at his normal dialysis clinic tomorrow, stating "they hurt me when they stick me. Why can't I come back here? How can I change and go to another clinic?" Encouragement provided regarding taking medications at home, going to dialysis, going to follow up appointments and to attend a day program. Per pt's request, I will review discharge paperwork and prescriptions with his mother as well when she gets here to transport him home. Pt belongings returned as noted. Pt denies SI/HI/AVH. Will continue to monitor. Mother to pick up pt after 1600.

## 2016-07-29 NOTE — Progress Notes (Signed)
Pt visible in milieu socializing with peers. Attended evening group. Pleasant during interaction. Medication compliant. States he occasionally has passive SI. Contracts for safety.  Denies HI/AVH. PRN Ultram given for leg pain with evening medications, effective. Voices no additional concerns at this time. Will continue to monitor.

## 2016-07-29 NOTE — Discharge Summary (Signed)
Physician Discharge Summary Note  Patient:  Ronnie Keith is an 55 y.o., male MRN:  [id number] DOB:  May 07, 1961 Patient phone:  [phone] (home)  Patient address:   [address] [place] [address],  Total Time spent with patient: 30 minutes  Date of Admission:  07/17/2016 Date of Discharge: 07/30/2016  Reason for Admission:  Suicidal ideation.  Identifying data. [name] is a 55 year old male with history of difficult developmental disability and depression.  Chief complaint. "I feel weak."  History of present illness. Information was obtained from the patient and the chart. The patient has a history of developmental disability and chronic kidney disease. A few months ago he was started on dialysis and h he became increasingly depressed. He was brought to the hospital from his dialysis center where he started voicing suicidal ideation. The patient is not a good historian and unable to describe his symptoms in great detail. He tells me that he is tired of being sick and does not want to press on. He reports very poor sleep, sadness, social isolation, crying spells. He does not have a plan how to commit suicide. His mother is very supportive and always encouraging. A few months ago he was started on Remeron by Dr. Shelbie Ammons while seen in consultation at [facility]. Reportedly it was helpful but he stopped taking Remeron several weeks ago. The patient denies psychotic symptoms or heightened anxiety. There are no drugs or alcohol involved.  Past psychiatric history. Apparently she has never been hospitalized or attempted suicide. He was started on Remeron at that time he dialysis begun. There is mild developmental disability.  Family psychiatric history. Unknown.  Social history. The patient is disabled he lives with his mother who is very supportive. The patient functions well at baseline. He is able to take care of his needs. He drives a car. He does have  decision-making capacity.  Principal Problem: Major depressive disorder, recurrent severe without psychotic features [facility]) Discharge Diagnoses: Patient Active Problem List   Diagnosis Date Noted  . Chronic kidney disease [N18.9] 07/17/2016  . Intellectual disability [F79] 07/16/2016  . Major depressive disorder, recurrent severe without psychotic features ([place]) [F33.2] 09/28/2015  . Hyperkalemia [E87.5] 09/20/2015   Past Medical History:  Past Medical History:  Diagnosis Date  . Anxiety   . Chronic kidney disease   . Chronic low back pain   . Hypertension   . Sciatica   . Scoliosis     Past Surgical History:  Procedure Laterality Date  . CLEFT PALATE REPAIR    . EYE SURGERY     "when I was small"  . PERIPHERAL VASCULAR CATHETERIZATION N/A 09/29/2015   Procedure: Dialysis/Perma Catheter Insertion;  Surgeon: Katha Cabal, MD;  Location: [facility];  Service: Cardiovascular;  Laterality: N/A;  . ROTATOR CUFF REPAIR Left    Family History:  Family History  Problem Relation Age of Onset  . Hypertension Other     Social History:  History  Alcohol Use No     History  Drug Use No    Social History   Social History  . Marital status: Single    Spouse name: N/A  . Number of children: N/A  . Years of education: N/A   Social History Main Topics  . Smoking status: Never Smoker  . Smokeless tobacco: Never Used  . Alcohol use No  . Drug use: No  . Sexual activity: Not Currently   Other Topics Concern  .  None   Social History Narrative  . None    Hospital Course:    [name] is a 55 year old male with a history of developmental disability, depression, and chronic kidney disease on dialysis admitted for suicidal ideation.  1. Suicidal ideation. Resolved. The patient is able to contract for safety. He is forward thinking and more optimistic about the future.  2. Mood. We started Seroquel and Wellbutrin for depression.   3. Insomnia. This  improved with Seroquel.  4. GERD. He is on Protonix 40 mg by mouth daily  5. Chronic kidney disease. Nephrology consult is greatly appreciated. The patient continued dialysis.  6. Hypertension. We continude Toprol-XL 50 mg by mouth daily  7. Chronic leg pain. The patient ambulates with a walker. We started tramadol for pain and Requip for restless legs. Prescription for 30 pills of Tramadol was provided.   8. Disposition. He was discharged back with his mother. He will follow up with [facility].   Physical Findings: AIMS: Facial and Oral Movements Muscles of Facial Expression: None, normal Lips and Perioral Area: None, normal Jaw: None, normal Tongue: None, normal,Extremity Movements Upper (arms, wrists, hands, fingers): None, normal Lower (legs, knees, ankles, toes): None, normal, Trunk Movements Neck, shoulders, hips: None, normal, Overall Severity Severity of abnormal movements (highest score from questions above): None, normal Incapacitation due to abnormal movements: None, normal Patient's awareness of abnormal movements (rate only patient's report): No Awareness, Dental Status Current problems with teeth and/or dentures?: No Does patient usually wear dentures?: No  CIWA:    COWS:     Musculoskeletal: Strength & Muscle Tone: within normal limits Gait & Station: unsteady, uses a walker. Patient leans: N/A  Psychiatric Specialty Exam: Physical Exam  Nursing note and vitals reviewed.   Review of Systems  Musculoskeletal: Positive for myalgias.  Psychiatric/Behavioral: The patient is nervous/anxious and has insomnia.   All other systems reviewed and are negative.   Blood pressure 125/75, pulse 81, temperature 97.7 F (36.5 C), temperature source Oral, resp. rate 18, height 6\' 1"  (1.854 m), weight 115.8 kg (255 lb 4.7 oz), SpO2 100 %.Body mass index is 33.68 kg/m.  General Appearance: Casual  Eye Contact:  Good  Speech:  Clear and Coherent  Volume:  Normal  Mood:   Anxious  Affect:  Appropriate  Thought Process:  Goal Directed and Descriptions of Associations: Intact  Orientation:  Full (Time, Place, and Person)  Thought Content:  WDL  Suicidal Thoughts:  No  Homicidal Thoughts:  No  Memory:  Immediate;   Fair Recent;   Fair Remote;   Fair  Judgement:  Impaired  Insight:  Shallow  Psychomotor Activity:  Normal  Concentration:  Concentration: Fair and Attention Span: Fair  Recall:  [employer] of Knowledge:  Fair  Language:  Fair  Akathisia:  No  Handed:  Right  AIMS (if indicated):     Assets:  Communication Skills Desire for Improvement Financial Resources/Insurance Housing Resilience Social Support Transportation  ADL's:  Intact  Cognition:  WNL  Sleep:  Number of Hours: 5     Have you used any form of tobacco in the last 30 days? (Cigarettes, Smokeless Tobacco, Cigars, and/or Pipes): No  Has this patient used any form of tobacco in the last 30 days? (Cigarettes, Smokeless Tobacco, Cigars, and/or Pipes) Yes, No  Blood Alcohol level:  Lab Results  Component Value Date   ETH <5 80/99/8338    Metabolic Disorder Labs:  Lab Results  Component Value Date  HGBA1C 5.2 07/16/2016   MPG 103 07/16/2016   No results found for: PROLACTIN No results found for: CHOL, TRIG, HDL, CHOLHDL, VLDL, LDLCALC  See Psychiatric Specialty Exam and Suicide Risk Assessment completed by Attending Physician prior to discharge.  Discharge destination:  Home  Is patient on multiple antipsychotic therapies at discharge:  No   Has Patient had three or more failed trials of antipsychotic monotherapy by history:  No  Recommended Plan for Multiple Antipsychotic Therapies: NA  Discharge Instructions    Diet - low sodium heart healthy    Complete by:  As directed    Increase activity slowly    Complete by:  As directed        Medication List    STOP taking these medications   ALPRAZolam 1 MG tablet Commonly known as:  XANAX   gabapentin 300  MG capsule Commonly known as:  NEURONTIN   mirtazapine 30 MG tablet Commonly known as:  REMERON   oxyCODONE 5 MG immediate release tablet Commonly known as:  Oxy IR/ROXICODONE     TAKE these medications     Indication  acetaminophen 325 MG tablet Commonly known as:  TYLENOL Take 650 mg by mouth every 6 (six) hours as needed for mild pain or moderate pain.  Indication:  Pain   aspirin EC 81 MG tablet Take 81 mg by mouth daily.  Indication:  Inflammation   buPROPion 150 MG 24 hr tablet Commonly known as:  WELLBUTRIN XL Take 1 tablet (150 mg total) by mouth daily. Start taking on:  07/30/2016  Indication:  Major Depressive Disorder   calcium acetate 667 MG capsule Commonly known as:  PHOSLO Take 2 capsules (1,334 mg total) by mouth 3 (three) times daily with meals.  Indication:  Hyperphosphatemia D/T Renal Insufficiency   fluticasone 50 MCG/ACT nasal spray Commonly known as:  FLONASE Place 1 spray into both nostrils daily.  Indication:  Signs and Symptoms of Nose Diseases   lidocaine-prilocaine cream Commonly known as:  EMLA Apply topically once.  Indication:  Anesthesia to a Specific Part of the Body   MELATONIN PO Take 1 tablet by mouth daily.  Indication:  insomnia   metoprolol succinate 50 MG 24 hr tablet Commonly known as:  TOPROL-XL Take 50 mg by mouth daily.  Indication:  High Blood Pressure Disorder   omeprazole 40 MG capsule Commonly known as:  PRILOSEC Take 40 mg by mouth daily.  Indication:  Gastroesophageal Reflux Disease   QUEtiapine 200 MG tablet Commonly known as:  SEROQUEL Take 1 tablet (200 mg total) by mouth at bedtime.  Indication:  Depressive Phase of Manic-Depression   rOPINIRole 2 MG tablet Commonly known as:  REQUIP Take 1 tablet (2 mg total) by mouth at bedtime.  Indication:  Restless Leg Syndrome   senna-docusate 8.6-50 MG tablet Commonly known as:  Senokot-S Take 2 tablets by mouth 2 (two) times daily.  Indication:   Constipation   traMADol 50 MG tablet Commonly known as:  ULTRAM Take 1 tablet (50 mg total) by mouth every 6 (six) hours as needed for severe pain.  Indication:  Moderate to Moderately Severe Pain        Follow-up recommendations:  Activity:  as tolerated. Diet:  low sodium heart healthy renal diet. Other:  keep follow up appointments.  Comments:    Signed: Orson Slick, MD 07/29/2016, 1:02 PM

## 2016-07-29 NOTE — Progress Notes (Signed)
Pt has been awake, alert, oriented and out of his room this morning, sitting in dayroom. Appropriately interacts with staff/peers. Noted to be socializing in dayroom with staff/peers. Reports poor sleep last night, as he could not fall asleep until after 0100. Night shift reported 5 hours of sleep. Pt does voice anxiety regarding discharge stating "I don't know how I'll handle it." Compliant with medications. Denies SI/HI/AVH.   Support and encouragement provided with use of therapeutic communication. Medications administered as ordered with education. Safety maintained. Will continue to monitor.

## 2016-09-22 ENCOUNTER — Other Ambulatory Visit: Payer: Self-pay | Admitting: Psychiatry

## 2017-01-31 IMAGING — CR DG CHEST 2V
1 series · 3 of 3 positions shown · non-contrast
Comparison: 07/11/2016

CLINICAL DATA: Cough for several days. Weakness. Abdominal pain. On
dialysis.

EXAM:
CHEST  2 VIEW

[Series 1: x chest ap · 0.14mm/px · 3 of 3 slices shown]
[im 1/3]
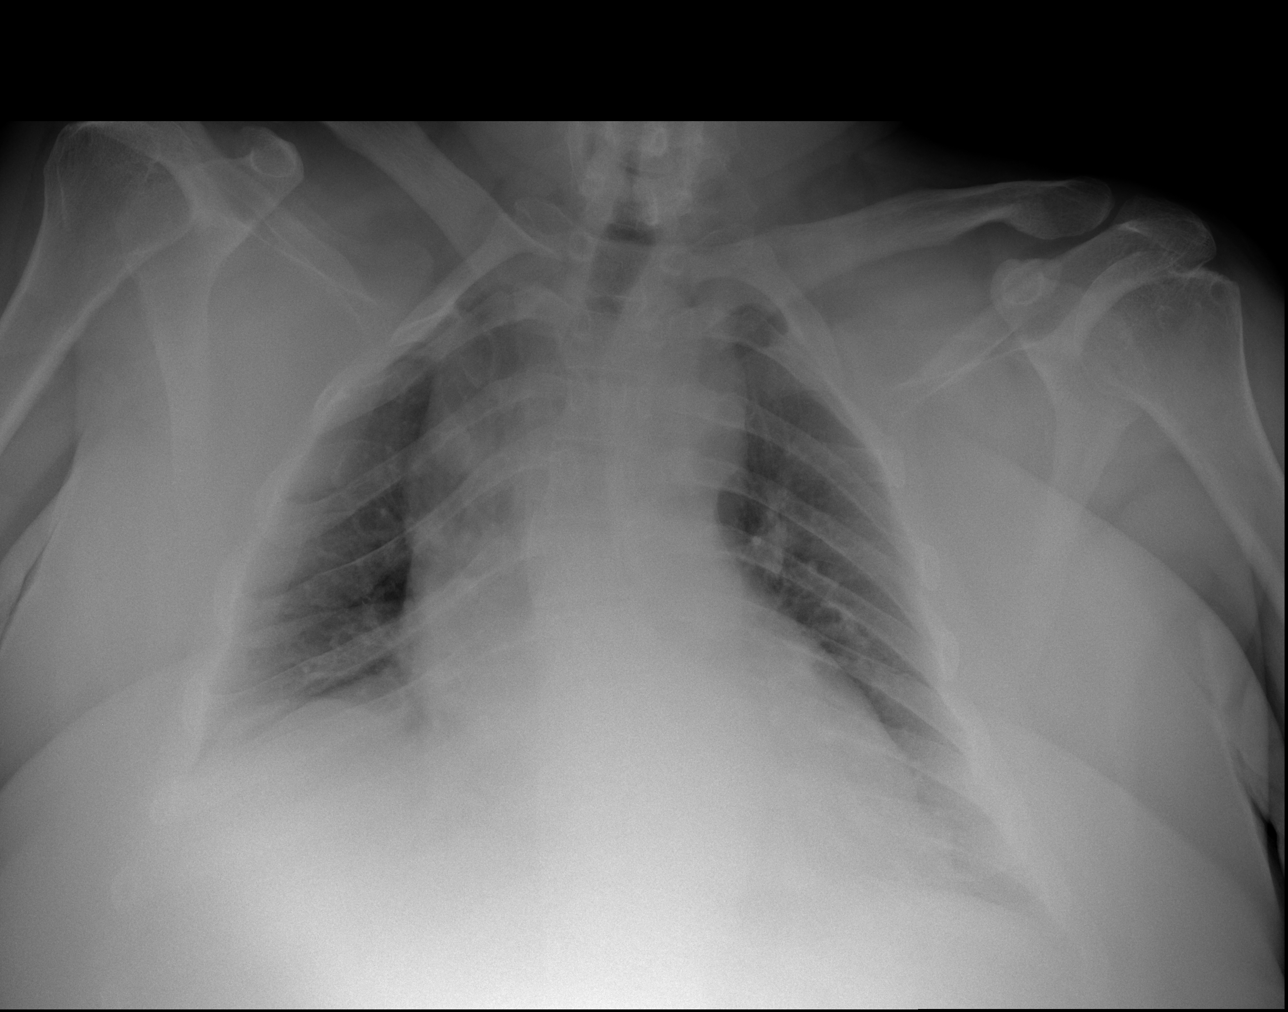
[im 2/3]
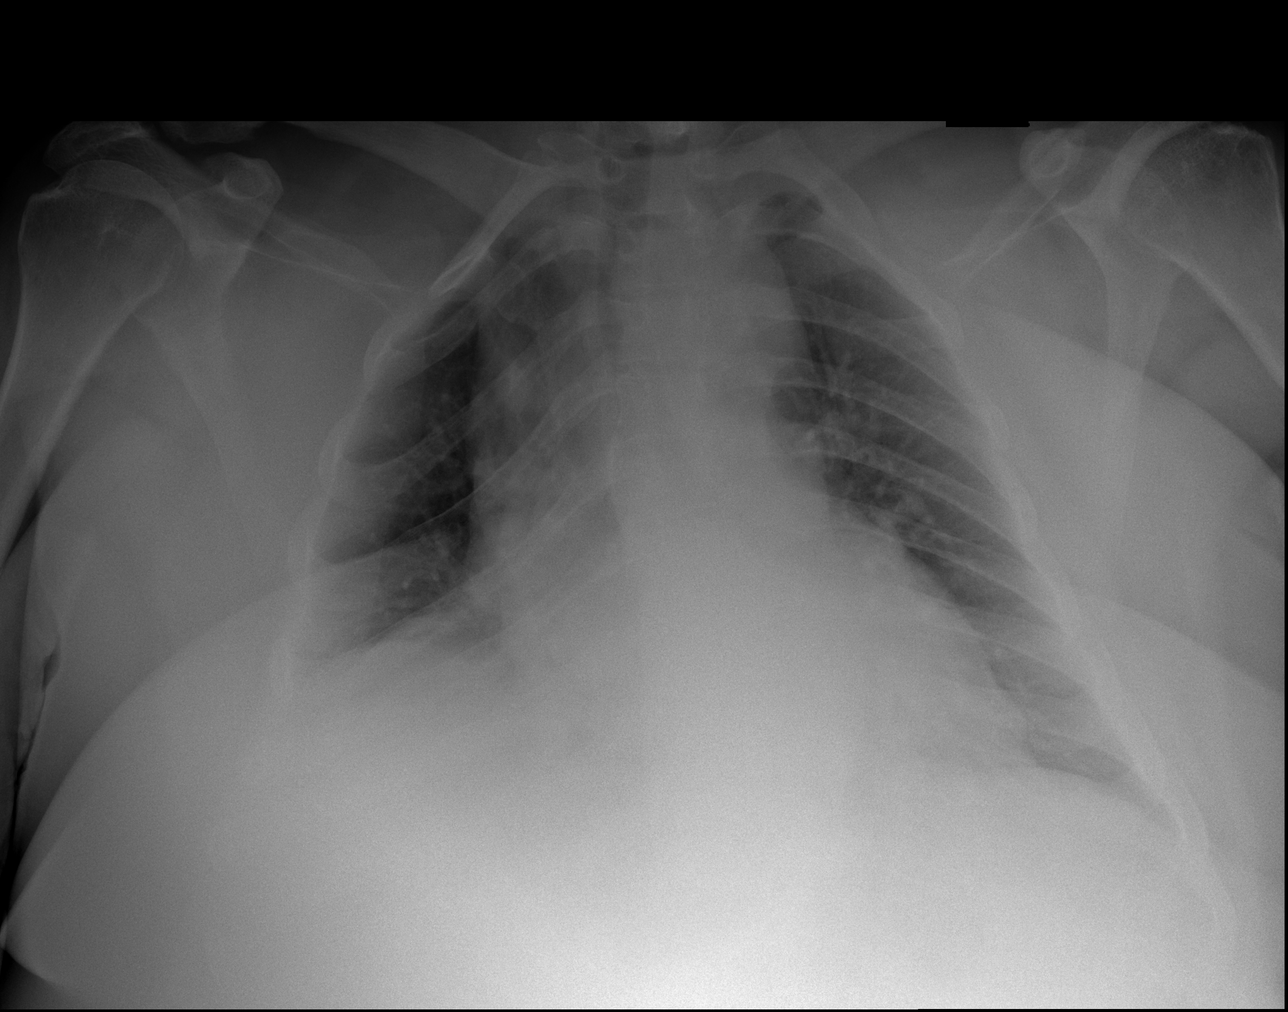
[im 3/3]
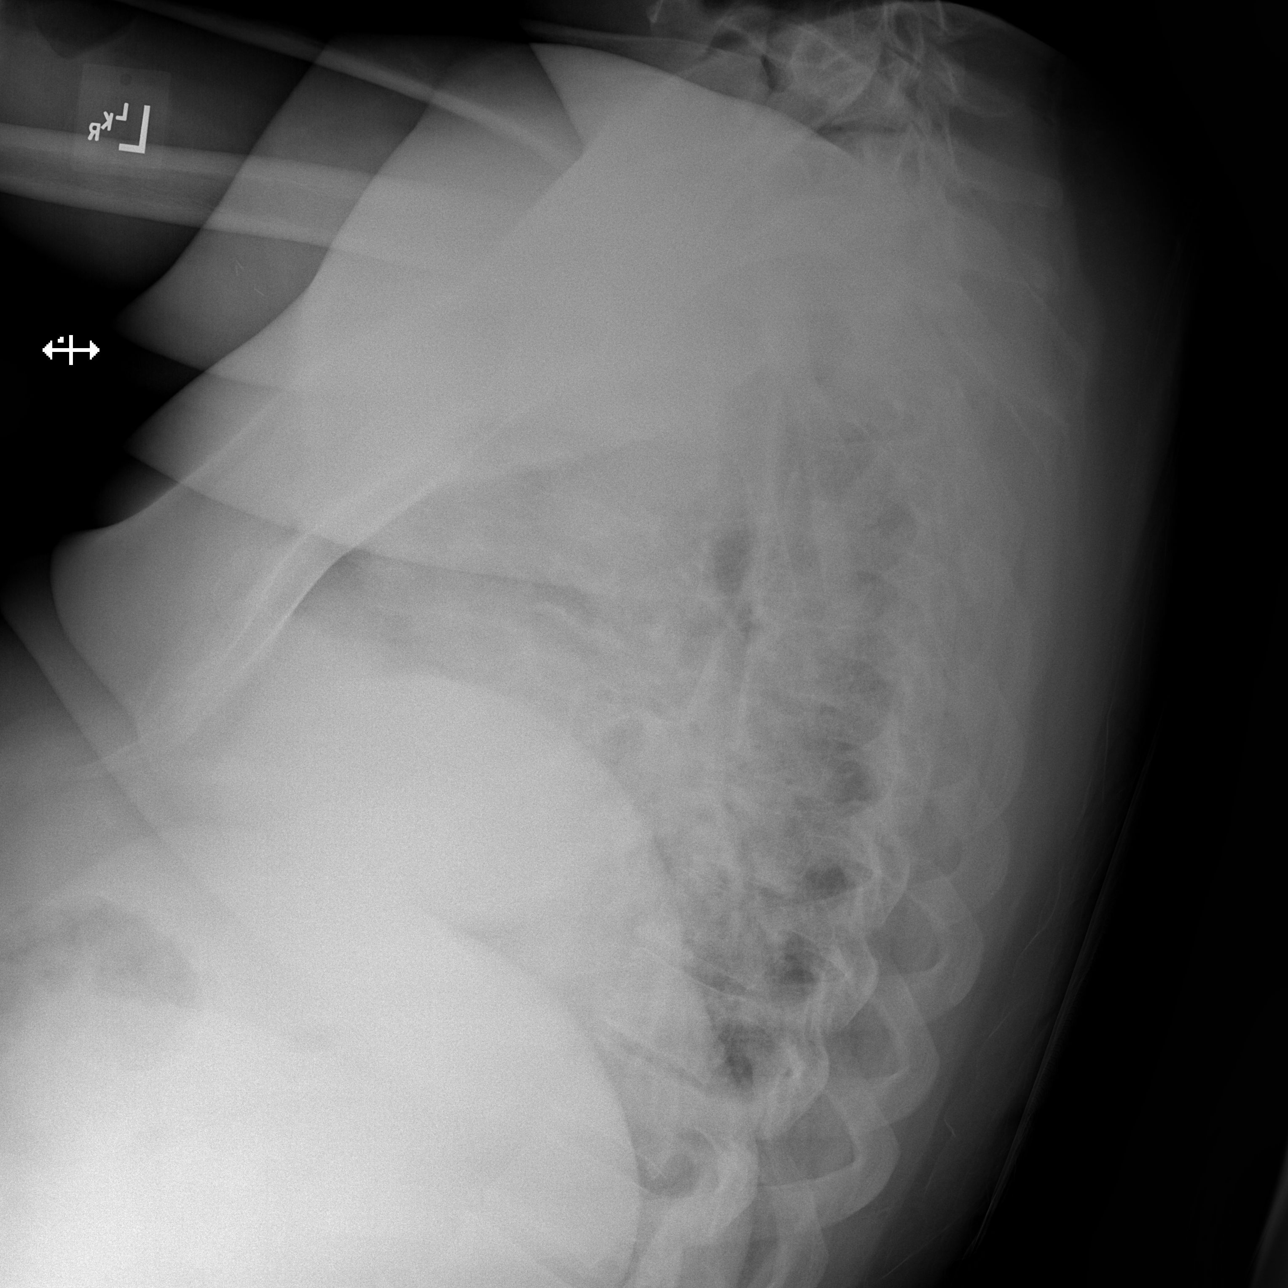

[3 of 3 positions shown; findings below may reference images not displayed]

FINDINGS: Low lung volumes noted. Both lungs appear clear. Heart size is
stable. Lordotic positioning noted.
IMPRESSION: Stable low lung volumes.  No active lung disease.

## 2017-07-30 ENCOUNTER — Ambulatory Visit: Payer: Self-pay | Admitting: Oncology

## 2017-07-30 ENCOUNTER — Inpatient Hospital Stay: Payer: Medicare Other | Attending: Oncology | Admitting: Hematology and Oncology

## 2017-07-30 ENCOUNTER — Other Ambulatory Visit: Payer: Self-pay

## 2017-07-30 ENCOUNTER — Inpatient Hospital Stay: Payer: Medicare Other

## 2017-07-30 ENCOUNTER — Other Ambulatory Visit: Payer: Self-pay | Admitting: Hematology and Oncology

## 2017-07-30 ENCOUNTER — Encounter: Payer: Self-pay | Admitting: Hematology and Oncology

## 2017-07-30 VITALS — BP 144/95 | HR 76 | Temp 96.9°F | Resp 20 | Wt 229.5 lb

## 2017-07-30 DIAGNOSIS — M419 Scoliosis, unspecified: Secondary | ICD-10-CM

## 2017-07-30 DIAGNOSIS — Z992 Dependence on renal dialysis: Secondary | ICD-10-CM | POA: Diagnosis not present

## 2017-07-30 DIAGNOSIS — M545 Low back pain: Secondary | ICD-10-CM

## 2017-07-30 DIAGNOSIS — R790 Abnormal level of blood mineral: Secondary | ICD-10-CM

## 2017-07-30 DIAGNOSIS — N189 Chronic kidney disease, unspecified: Secondary | ICD-10-CM | POA: Insufficient documentation

## 2017-07-30 DIAGNOSIS — R7989 Other specified abnormal findings of blood chemistry: Secondary | ICD-10-CM | POA: Insufficient documentation

## 2017-07-30 DIAGNOSIS — G2581 Restless legs syndrome: Secondary | ICD-10-CM | POA: Diagnosis not present

## 2017-07-30 DIAGNOSIS — F419 Anxiety disorder, unspecified: Secondary | ICD-10-CM | POA: Insufficient documentation

## 2017-07-30 DIAGNOSIS — G8929 Other chronic pain: Secondary | ICD-10-CM | POA: Diagnosis not present

## 2017-07-30 DIAGNOSIS — Z79899 Other long term (current) drug therapy: Secondary | ICD-10-CM | POA: Diagnosis not present

## 2017-07-30 DIAGNOSIS — I129 Hypertensive chronic kidney disease with stage 1 through stage 4 chronic kidney disease, or unspecified chronic kidney disease: Secondary | ICD-10-CM | POA: Diagnosis not present

## 2017-07-30 DIAGNOSIS — D539 Nutritional anemia, unspecified: Secondary | ICD-10-CM

## 2017-07-30 DIAGNOSIS — Z7982 Long term (current) use of aspirin: Secondary | ICD-10-CM | POA: Diagnosis not present

## 2017-07-30 LAB — CBC WITH DIFFERENTIAL/PLATELET
Basophils Absolute: 0 10*3/uL (ref 0–0.1)
Basophils Relative: 1 %
Eosinophils Absolute: 0.2 10*3/uL (ref 0–0.7)
Eosinophils Relative: 4 %
HCT: 34.1 % — ABNORMAL LOW (ref 40.0–52.0)
Hemoglobin: 11.3 g/dL — ABNORMAL LOW (ref 13.0–18.0)
Lymphocytes Relative: 31 %
Lymphs Abs: 1.3 10*3/uL (ref 1.0–3.6)
MCH: 33 pg (ref 26.0–34.0)
MCHC: 33.2 g/dL (ref 32.0–36.0)
MCV: 99.7 fL (ref 80.0–100.0)
Monocytes Absolute: 0.4 10*3/uL (ref 0.2–1.0)
Monocytes Relative: 9 %
Neutro Abs: 2.3 10*3/uL (ref 1.4–6.5)
Neutrophils Relative %: 55 %
Platelets: 107 10*3/uL — ABNORMAL LOW (ref 150–440)
RBC: 3.42 MIL/uL — ABNORMAL LOW (ref 4.40–5.90)
RDW: 16.3 % — ABNORMAL HIGH (ref 11.5–14.5)
WBC: 4.1 10*3/uL (ref 3.8–10.6)

## 2017-07-30 LAB — IRON AND TIBC
Iron: 94 ug/dL (ref 45–182)
Saturation Ratios: 46 % — ABNORMAL HIGH (ref 17.9–39.5)
TIBC: 202 ug/dL — ABNORMAL LOW (ref 250–450)
UIBC: 108 ug/dL

## 2017-07-30 LAB — SEDIMENTATION RATE: Sed Rate: 31 mm/hr — ABNORMAL HIGH (ref 0–20)

## 2017-07-30 LAB — FOLATE: Folate: 13.1 ng/mL (ref >5.9)

## 2017-07-30 LAB — FERRITIN: Ferritin: 1143 ng/mL — ABNORMAL HIGH (ref 24–336)

## 2017-07-30 NOTE — Progress Notes (Signed)
[facility] day:  07/30/2017  Chief Complaint: Ronnie Keith is a 56 y.o. male with an abnormal iron saturation who is referred in consultation by Dr. Gurney Maxin for assessment and management.  HPI: The patient is followed by Dr. Melrose Nakayama for restless legs. Symptoms started when he began hemodialysis 2 years ago. Patient notes that he stopped taking one of his blood pressures pills, which subsequently led to renal failure.  He is followed by Dr. Holley Raring.  Ferritin was 680 on 06/18/2017.  CBC included a hematocrit of 35.5, hemoglobin 11.3, MCV 102.2, platelets 111,000, and WBC 3700 with an ANC of 1900.  TSH was 1.556 (normal).   Creatinine was 7.06 with normal liver function tests.  Iron studies on 07/07/2017 revealed an iron saturation of 47% and a TIBC of 173.2 (low).   Patient notes that his diet is "good". He states, "I do not eat 3 meals a day". Patient eats iron rich foods (meats and dark green vegetables) daily. Patient denies fevers and sweats. Patient has intentionally lost 50 pounds over the past 4-6 months. Patient denies changes in his bowel habits. He has not appreciated any obvious hematochezia, melena, or gross hematuria. Patient has been having difficulties swallowing. He is being followed by GI at [facility]. 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  Patient underwent esophageal dilatation and has Botox injections in his esophagus. Patient with a planned colonoscopy scheduled at [facility] on 08/18/2017.  Patient's mother has hemachromatosis.   She undergoes phlebotomy every 3 months.    Past Medical History:  Diagnosis Date  . Anxiety   . Chronic kidney disease   . Chronic low back pain   . Hypertension   . Sciatica   . Scoliosis     Past Surgical History:  Procedure Laterality Date  . CLEFT PALATE REPAIR    . EYE SURGERY     "when I was small"  . PERIPHERAL VASCULAR CATHETERIZATION N/A 09/29/2015   Procedure:  Dialysis/Perma Catheter Insertion;  Surgeon: Katha Cabal, MD;  Location: [facility];  Service: Cardiovascular;  Laterality: N/A;  . ROTATOR CUFF REPAIR Left     Family History  Problem Relation Age of Onset  . Hypertension Other     Social History:  reports that  has never smoked. he has never used smokeless tobacco. He reports that he does not drink alcohol or use drugs.  Patient is on disability. There have been no known exposures to radiation or toxins.  He lives in [place] with his sister and mother [name]).  He likes to bowl and go "people watching".  His sister has cerebral palsy. The patient is accompanied by his mother today.  Allergies:  Allergies  Allergen Reactions  . Vancomycin Rash    Current Medications: Current Outpatient Medications  Medication Sig Dispense Refill  . acetaminophen (TYLENOL) 325 MG tablet Take 650 mg by mouth every 6 (six) hours as needed for mild pain or moderate pain.    Marland Kitchen aspirin EC 81 MG tablet Take 81 mg by mouth daily.    Marland Kitchen buPROPion (WELLBUTRIN XL) 150 MG 24 hr tablet Take 1 tablet (150 mg total) by mouth daily. 30 tablet 1  . calcium acetate (PHOSLO) 667 MG capsule Take 2 capsules (1,334 mg total) by mouth 3 (three) times daily with meals. 180 capsule 1  . fluticasone (FLONASE) 50 MCG/ACT nasal spray Place 1 spray into both nostrils daily.    Marland Kitchen  MELATONIN PO Take 1 tablet by mouth daily.    . metoprolol succinate (TOPROL-XL) 50 MG 24 hr tablet Take 50 mg by mouth daily.    Marland Kitchen omeprazole (PRILOSEC) 40 MG capsule Take 40 mg by mouth daily.    . QUEtiapine (SEROQUEL) 200 MG tablet Take 1 tablet (200 mg total) by mouth at bedtime. 30 tablet 1  . rOPINIRole (REQUIP) 2 MG tablet Take 1 tablet (2 mg total) by mouth at bedtime. 30 tablet 1  . senna-docusate (SENOKOT-S) 8.6-50 MG tablet Take 2 tablets by mouth 2 (two) times daily. 60 tablet 2  . traMADol (ULTRAM) 50 MG tablet Take 1 tablet (50 mg total) by mouth every 6 (six) hours as needed  for severe pain. 30 tablet 0   No current facility-administered medications for this visit.     Review of Systems:  GENERAL:  Feels "ok".  No fevers or sweats.  Weight loss of 50 pounds in the past 4-6 months (? real). 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  Hoarseness followed by ENT.  No visual changes, runny nose, sore throat, mouth sores or tenderness. Lungs:  No shortness of breath or cough.  No hemoptysis. Cardiac:  No chest pain, palpitations, orthopnea, or PND. GI:  Reflux.  Swallowing difficulties.  No nausea, vomiting, diarrhea, constipation, melena or hematochezia. GU:  No urgency, frequency, dysuria, or hematuria. Musculoskeletal:  No back pain.  No joint pain.  No muscle tenderness. Extremities:  No pain or swelling. Skin:  No rashes or skin changes. Neuro:  No headache, numbness or weakness, balance or coordination issues. Endocrine:  No diabetes, thyroid issues, hot flashes or night sweats. Psych:  No mood changes, depression or anxiety. Pain:  No focal pain. Review of systems:  All other systems reviewed and found to be negative.  Physical Exam: Blood pressure (!) 144/95, pulse 76, temperature (!) 96.9 F (36.1 C), temperature source Tympanic, resp. rate 20, weight 229 lb 8 oz (104.1 kg), SpO2 100 %. GENERAL:  Well developed, well nourished, gentleman sitting comfortably in the exam room in no acute distress. MENTAL STATUS:  Alert and oriented to person, place and time. HEAD:  Thin dark blonde hair; male pattern baldness.  Normocephalic, atraumatic, face symmetric, no Cushingoid features. EYES:  Glasses.  Dark blue eyes.  Pupils equal round and reactive to light and accomodation.  No conjunctivitis or scleral icterus. ENT:  Oropharynx clear without lesion.  Tongue normal.  Reconstructed uvula.  Missing lower tooth.  Mucous membranes moist.  RESPIRATORY:  Clear to auscultation without rales, wheezes or rhonchi. CARDIOVASCULAR:  Regular rate and  rhythm without murmur, rub or gallop. CHEST:  Right sided dialysis catheter. ABDOMEN:  Fully round.  Soft, non-tender, with active bowel sounds, and no appreciable hepatosplenomegaly.  No masses. SKIN:  Fair complexion.  Large areas of ecchymosis on back (left > right) s/p fall.  No rashes, ulcers or lesions. EXTREMITIES:  Chronic lower extremity edema.  No skin discoloration or tenderness.  No palpable cords. LYMPH NODES: No palpable cervical, supraclavicular, axillary or inguinal adenopathy  NEUROLOGICAL: Unremarkable. PSYCH:  Appropriate.   No visits with results within 3 Day(s) from this visit.  Latest known visit with results is:  Admission on 07/16/2016, Discharged on 07/16/2016  Component Date Value Ref Range Status  . Sodium 07/16/2016 140  135 - 145 mmol/L Final  . Potassium 07/16/2016 4.1  3.5 - 5.1 mmol/L Final  . Chloride 07/16/2016 99* 101 - 111 mmol/L Final  .  CO2 07/16/2016 33* 22 - 32 mmol/L Final  . Glucose, Bld 07/16/2016 89  65 - 99 mg/dL Final  . BUN 07/16/2016 26* 6 - 20 mg/dL Final  . Creatinine, Ser 07/16/2016 5.42* 0.61 - 1.24 mg/dL Final  . Calcium 07/16/2016 8.5* 8.9 - 10.3 mg/dL Final  . Total Protein 07/16/2016 8.1  6.5 - 8.1 g/dL Final  . Albumin 07/16/2016 4.2  3.5 - 5.0 g/dL Final  . AST 07/16/2016 19  15 - 41 U/L Final  . ALT 07/16/2016 17  17 - 63 U/L Final  . Alkaline Phosphatase 07/16/2016 137* 38 - 126 U/L Final  . Total Bilirubin 07/16/2016 0.4  0.3 - 1.2 mg/dL Final  . GFR calc non Af Amer 07/16/2016 11* >60 mL/min Final  . GFR calc Af Amer 07/16/2016 13* >60 mL/min Final   Comment: (NOTE) The eGFR has been calculated using the CKD EPI equation. This calculation has not been validated in all clinical situations. eGFR's persistently <60 mL/min signify possible Chronic Kidney Disease.   . Anion gap 07/16/2016 8  5 - 15 Final  . Alcohol, Ethyl (B) 07/16/2016 <5  <5 mg/dL Final   Comment:        LOWEST DETECTABLE LIMIT FOR SERUM ALCOHOL IS 5  mg/dL FOR MEDICAL PURPOSES ONLY   . Salicylate Lvl 70/35/0093 <7.0  2.8 - 30.0 mg/dL Final  . Acetaminophen (Tylenol), Serum 07/16/2016 <10* 10 - 30 ug/mL Final   Comment:        THERAPEUTIC CONCENTRATIONS VARY SIGNIFICANTLY. A RANGE OF 10-30 ug/mL MAY BE AN EFFECTIVE CONCENTRATION FOR MANY PATIENTS. HOWEVER, SOME ARE BEST TREATED AT CONCENTRATIONS OUTSIDE THIS RANGE. ACETAMINOPHEN CONCENTRATIONS >150 ug/mL AT 4 HOURS AFTER INGESTION AND >50 ug/mL AT 12 HOURS AFTER INGESTION ARE OFTEN ASSOCIATED WITH TOXIC REACTIONS.   . WBC 07/16/2016 6.1  3.8 - 10.6 K/uL Final  . RBC 07/16/2016 3.71* 4.40 - 5.90 MIL/uL Final  . Hemoglobin 07/16/2016 11.9* 13.0 - 18.0 g/dL Final  . HCT 07/16/2016 34.4* 40.0 - 52.0 % Final  . MCV 07/16/2016 92.7  80.0 - 100.0 fL Final  . MCH 07/16/2016 32.0  26.0 - 34.0 pg Final  . MCHC 07/16/2016 34.5  32.0 - 36.0 g/dL Final  . RDW 07/16/2016 15.1* 11.5 - 14.5 % Final  . Platelets 07/16/2016 129* 150 - 440 K/uL Final  . Tricyclic, Ur Screen 81/82/9937 NONE DETECTED  NONE DETECTED Final  . Amphetamines, Ur Screen 07/16/2016 NONE DETECTED  NONE DETECTED Final  . MDMA (Ecstasy)Ur Screen 07/16/2016 NONE DETECTED  NONE DETECTED Final  . Cocaine Metabolite,Ur [place] 07/16/2016 NONE DETECTED  NONE DETECTED Final  . Opiate, Ur Screen 07/16/2016 NONE DETECTED  NONE DETECTED Final  . Phencyclidine (PCP) Ur S 07/16/2016 NONE DETECTED  NONE DETECTED Final  . Cannabinoid 50 Ng, Ur [place] 07/16/2016 NONE DETECTED  NONE DETECTED Final  . Barbiturates, Ur Screen 07/16/2016 NONE DETECTED  NONE DETECTED Final  . Benzodiazepine, Ur Scrn 07/16/2016 NONE DETECTED  NONE DETECTED Final  . Methadone Scn, Ur 07/16/2016 NONE DETECTED  NONE DETECTED Final   Comment: (NOTE) 169  Tricyclics, urine               Cutoff 1000 ng/mL 200  Amphetamines, urine             Cutoff 1000 ng/mL 300  MDMA (Ecstasy), urine           Cutoff 500 ng/mL 400  Cocaine Metabolite, urine       Cutoff 300  ng/mL 500  Opiate, urine                   Cutoff 300 ng/mL 600  Phencyclidine (PCP), urine      Cutoff 25 ng/mL 700  Cannabinoid, urine              Cutoff 50 ng/mL 800  Barbiturates, urine             Cutoff 200 ng/mL 900  Benzodiazepine, urine           Cutoff 200 ng/mL 1000 Methadone, urine                Cutoff 300 ng/mL 1100 1200 The urine drug screen provides only a preliminary, unconfirmed 1300 analytical test result and should not be used for non-medical 1400 purposes. Clinical consideration and professional judgment should 1500 be applied to any positive drug screen result due to possible 1600 interfering substances. A more specific alternate chemical method 1700 must be used in order to obtain a confirmed analytical result.  1800 Gas chromato                          graphy / mass spectrometry (GC/MS) is the preferred 1900 confirmatory method.   . Color, Urine 07/16/2016 YELLOW* YELLOW Final  . APPearance 07/16/2016 CLEAR* CLEAR Final  . Glucose, UA 07/16/2016 50* NEGATIVE mg/dL Final  . Bilirubin Urine 07/16/2016 NEGATIVE  NEGATIVE Final  . Ketones, ur 07/16/2016 NEGATIVE  NEGATIVE mg/dL Final  . Specific Gravity, Urine 07/16/2016 1.012  1.005 - 1.030 Final  . Hgb urine dipstick 07/16/2016 NEGATIVE  NEGATIVE Final  . pH 07/16/2016 7.0  5.0 - 8.0 Final  . Protein, ur 07/16/2016 100* NEGATIVE mg/dL Final  . Nitrite 07/16/2016 NEGATIVE  NEGATIVE Final  . Leukocytes, UA 07/16/2016 NEGATIVE  NEGATIVE Final  . RBC / HPF 07/16/2016 0-5  0 - 5 RBC/hpf Final  . WBC, UA 07/16/2016 0-5  0 - 5 WBC/hpf Final  . Bacteria, UA 07/16/2016 NONE SEEN  NONE SEEN Final  . Squamous Epithelial / LPF 07/16/2016 NONE SEEN  NONE SEEN Final  . Lipase 07/16/2016 44  11 - 51 U/L Final    Assessment:  Ronnie Keith is a 56 y.o. 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  He has an elevated iron saturation and ferritin.  His mother has hemochromatosis.  Ferritin was 680 on 06/18/2017.  Hematocrit was 35.5,  hemoglobin 11.3, MCV 102.2, platelets 111,000, and WBC 3700 with an ANC of 1900.  Liver function tests were normal.  Iron saturation was 47% with a TIBC of 173.2 (low) on 07/07/2017.  He has renal failure and has been on dialysis x 2 years.  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  Colonoscopy is scheduled on 08/18/2017.  He is handicapped and is on disability.  Symptomatically, he denies any complaints.   Per his report, he has lost 50 pounds over the past 4-6 months.  Plan: 1.  Discuss increased ferritin and iron saturation suggestive of iron overload.  Discuss differential diagnosis.  Given family history, high suspicion for hemochromatosis.  Discuss typical management with phlebotomies.  Discuss current anemia presumably from renal insufficiency. 2.  Discuss macrocytic RBC indices.  Check B12 and folate. 3.  Labs today:  CBC with diff, ferritin, iron studies, sed rate, CRP, hemochromatosis assay. 4.  Follow-up planned colonoscopy at [facility] on 08/18/2017. 5.  RTC in  1 week for MD assessment, review of workup, and +/- small volume phlebotomy.     Honor Loh, NP  07/30/2017, 12:42 PM   I saw and evaluated the patient, participating in the key portions of the service and reviewing pertinent diagnostic studies and records.  I reviewed the nurse practitioner's note and agree with the findings and the plan.  The assessment and plan were discussed with the patient.  Several questions were asked by the patient and answered.   Nolon Stalls, MD 07/30/2017,12:42 PM

## 2017-07-30 NOTE — Progress Notes (Signed)
Patient here today as new evaluation regarding elevated iron levels.  Referred by Dr. Melrose Nakayama.  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 Patient is also a dialysis patient. Accompanied by his mother today.

## 2017-07-31 LAB — VITAMIN B12: Vitamin B-12: 448 pg/mL (ref 180–914)

## 2017-07-31 LAB — C-REACTIVE PROTEIN: CRP: <0.8 mg/dL (ref <1.0)

## 2017-08-04 LAB — HEMOCHROMATOSIS DNA-PCR(C282Y,H63D)

## 2017-08-06 ENCOUNTER — Ambulatory Visit: Payer: Self-pay | Admitting: Hematology and Oncology

## 2017-08-13 ENCOUNTER — Inpatient Hospital Stay: Payer: Medicare Other

## 2017-08-13 ENCOUNTER — Inpatient Hospital Stay: Payer: Medicare Other | Attending: Hematology and Oncology | Admitting: Hematology and Oncology

## 2017-08-13 NOTE — Progress Notes (Deleted)
[facility] day:  08/13/2017  Chief Complaint: Ronnie Keith is a 56 y.o. male with an elevated l iron saturation who is seen for review of work-up and discussion regarding direction of therapy.  HPI: The patient was last seen in the hematology clinic on 07/30/2017 for initial consultation for an elevated iron saturation and ferritin.  Family history was notable for his mother having hemochromatosis and undergoing phlebotomy every 3 months.  He underwent a work-up.  CBC revealed a hematocrit of 34.1, hemoglobin 11.3, MCV 99.7, platelets 107,000, WBC 4100 with an ANC of 2300.  Differential included 55% segs, 31% lymphs, 9% eosinophils, and 1% basophils.  Ferritin was 1143 with an iron saturation of 46% and a TIBC of 202.  B12 was 448.  Sed rate was 31 (0-20).  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  Symptomatically,   Past Medical History:  Diagnosis Date  . Anxiety   . Chronic kidney disease   . Chronic low back pain   . Hypertension   . Sciatica   . Scoliosis     Past Surgical History:  Procedure Laterality Date  . CLEFT PALATE REPAIR    . EYE SURGERY     "when I was small"  . PERIPHERAL VASCULAR CATHETERIZATION N/A 09/29/2015   Procedure: Dialysis/Perma Catheter Insertion;  Surgeon: Katha Cabal, MD;  Location: [facility];  Service: Cardiovascular;  Laterality: N/A;  . ROTATOR CUFF REPAIR Left     Family History  Problem Relation Age of Onset  . Hypertension Other     Social History:  reports that  has never smoked. he has never used smokeless tobacco. He reports that he does not drink alcohol or use drugs.  Patient is on disability. There have been no known exposures to radiation or toxins.  He lives in [place] with his sister and mother [name]).  He likes to bowl and go "people watching".  His sister has cerebral palsy. The patient is accompanied by his mother today.  Allergies:  Allergies   Allergen Reactions  . Vancomycin Rash    Current Medications: Current Outpatient Medications  Medication Sig Dispense Refill  . acetaminophen (TYLENOL) 325 MG tablet Take 650 mg by mouth every 6 (six) hours as needed for mild pain or moderate pain.    Marland Kitchen aspirin EC 81 MG tablet Take 81 mg by mouth daily.    Marland Kitchen buPROPion (WELLBUTRIN XL) 150 MG 24 hr tablet Take 1 tablet (150 mg total) by mouth daily. 30 tablet 1  . calcium acetate (PHOSLO) 667 MG capsule Take 2 capsules (1,334 mg total) by mouth 3 (three) times daily with meals. 180 capsule 1  . fluticasone (FLONASE) 50 MCG/ACT nasal spray Place 1 spray into both nostrils daily.    Marland Kitchen MELATONIN PO Take 1 tablet by mouth daily.    . metoprolol succinate (TOPROL-XL) 50 MG 24 hr tablet Take 50 mg by mouth daily.    Marland Kitchen omeprazole (PRILOSEC) 40 MG capsule Take 40 mg by mouth daily.    . QUEtiapine (SEROQUEL) 200 MG tablet Take 1 tablet (200 mg total) by mouth at bedtime. 30 tablet 1  . rOPINIRole (REQUIP) 2 MG tablet Take 1 tablet (2 mg total) by mouth at bedtime. 30 tablet 1  . senna-docusate (SENOKOT-S) 8.6-50 MG tablet Take 2 tablets by mouth 2 (two) times daily. 60 tablet 2  . traMADol (ULTRAM) 50 MG tablet Take 1 tablet (50 mg total) by  mouth every 6 (six) hours as needed for severe pain. 30 tablet 0   No current facility-administered medications for this visit.     Review of Systems:  GENERAL:  Feels "ok".  No fevers or sweats.  Weight loss of 50 pounds in the past 4-6 months (? real). 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  Hoarseness followed by ENT.  No visual changes, runny nose, sore throat, mouth sores or tenderness. Lungs:  No shortness of breath or cough.  No hemoptysis. Cardiac:  No chest pain, palpitations, orthopnea, or PND. GI:  Reflux.  Swallowing difficulties.  No nausea, vomiting, diarrhea, constipation, melena or hematochezia. GU:  No urgency, frequency, dysuria, or hematuria. Musculoskeletal:  No  back pain.  No joint pain.  No muscle tenderness. Extremities:  No pain or swelling. Skin:  No rashes or skin changes. Neuro:  No headache, numbness or weakness, balance or coordination issues. Endocrine:  No diabetes, thyroid issues, hot flashes or night sweats. Psych:  No mood changes, depression or anxiety. Pain:  No focal pain. Review of systems:  All other systems reviewed and found to be negative.  Physical Exam: There were no vitals taken for this visit. GENERAL:  Well developed, well nourished, gentleman sitting comfortably in the exam room in no acute distress. MENTAL STATUS:  Alert and oriented to person, place and time. HEAD:  Thin dark blonde hair; male pattern baldness.  Normocephalic, atraumatic, face symmetric, no Cushingoid features. EYES:  Glasses.  Dark blue eyes.  Pupils equal round and reactive to light and accomodation.  No conjunctivitis or scleral icterus. ENT:  Oropharynx clear without lesion.  Tongue normal.  Reconstructed uvula.  Missing lower tooth.  Mucous membranes moist.  RESPIRATORY:  Clear to auscultation without rales, wheezes or rhonchi. CARDIOVASCULAR:  Regular rate and rhythm without murmur, rub or gallop. CHEST:  Right sided dialysis catheter. ABDOMEN:  Fully round.  Soft, non-tender, with active bowel sounds, and no appreciable hepatosplenomegaly.  No masses. SKIN:  Fair complexion.  Large areas of ecchymosis on back (left > right) s/p fall.  No rashes, ulcers or lesions. EXTREMITIES:  Chronic lower extremity edema.  No skin discoloration or tenderness.  No palpable cords. LYMPH NODES: No palpable cervical, supraclavicular, axillary or inguinal adenopathy  NEUROLOGICAL: Unremarkable. PSYCH:  Appropriate.   No visits with results within 3 Day(s) from this visit.  Latest known visit with results is:  Office Visit on 07/30/2017  Component Date Value Ref Range Status  . Folate 07/30/2017 13.1  >5.9 ng/mL Final  . Vitamin B-12 07/30/2017 448  180 - 914  pg/mL Final   Comment: (NOTE) This assay is not validated for testing neonatal or myeloproliferative syndrome specimens for Vitamin B12 levels. Performed at [facility], [place] [address], [place],  [address]   . DNA Mutation Analysis 07/30/2017 Comment   Final   Comment: (NOTE) Result:  C282Y/H63D Two mutations (C282Y and H63D) identified Interpretation: This patient's sample was analyzed for the hereditary hemochromatosis (HH) mutations C282Y, H63D, and S65C.  One copy of C282Y and one copy of H63D were identified.  Results for S65C were negative.  The mutations analyzed by [facility] are most common in the Caucasian population.  Although some patients with this genotype experience biochemically defined abnormalities of iron overload, the penetrance for clinical symptoms, such as cirrhosis, cardiomyopathy, diabetes and arthropathy, is low.  The diagnosis of HH should not rely on DNA testing alone.  Diagnosis of HH should  include clinical findings and other test results, such as transferrin-iron saturation and/or serum ferritin studies and/or liver biopsy.  HH is inherited in a recessive manner.  All the offspring from this patient will be carriers, and other family members are at increased risk.  Genetic counseling and HH molecular testi                          ng are recommended for at-risk family members. Methodology: DNA Analysis of the HFE gene was performed by PCR amplification followed by restriction enzyme digestion analyses. Reference: Cathe Mons and Walker AP. (2000). Genet Test 4:97-101. Cogswell ME et al. (1999). AM J Prev Med 16:134-140. Bomford A (2002). Lancet 360(9346):1673-81. Stan Head et al. (2002). Blood Cells, Molecules. and Diseases.  29(3):418-432. Imperatore G et al. (2003). Genet Med. 5(1):1-8. Cogswell ME et al. (2003). Genet Med. 5(4):304-10. This test was developed and its performance characteristics determined by [facility]. It has not  been cleared or approved by the Food and Drug Administration. Genetic counselors are available for health care providers to discuss results at 1-800-345-GENE. Allison Quarry, PhD, [facility] Ruben Reason, PhD, [facility] Annetta Maw, M.S., PhD, [facility] Alfredo Bach, PhD, [facility] Norva Riffle, PhD, [facility] Earlean Polka, PhD, [facility] Performed At: [facility] [place], [place] [id number] Nechama Guard MD WU:[id number]   . CRP 07/30/2017 <0.8  <1.0 mg/dL Final   Performed at [facility] [address] [place], [place] [address]  . Sed Rate 07/30/2017 31* 0 - 20 mm/hr Final  . Iron 07/30/2017 94  45 - 182 ug/dL Final  . TIBC 07/30/2017 202* 250 - 450 ug/dL Final  . Saturation Ratios 07/30/2017 46* 17.9 - 39.5 % Final  . UIBC 07/30/2017 108  ug/dL Final  . Ferritin 07/30/2017 1,143* 24 - 336 ng/mL Final  . WBC 07/30/2017 4.1  3.8 - 10.6 K/uL Final  . RBC 07/30/2017 3.42* 4.40 - 5.90 MIL/uL Final  . Hemoglobin 07/30/2017 11.3* 13.0 - 18.0 g/dL Final  . HCT 07/30/2017 34.1* 40.0 - 52.0 % Final  . MCV 07/30/2017 99.7  80.0 - 100.0 fL Final  . MCH 07/30/2017 33.0  26.0 - 34.0 pg Final  . MCHC 07/30/2017 33.2  32.0 - 36.0 g/dL Final  . RDW 07/30/2017 16.3* 11.5 - 14.5 % Final  . Platelets 07/30/2017 107* 150 - 440 K/uL Final  . Neutrophils Relative % 07/30/2017 55  % Final  . Neutro Abs 07/30/2017 2.3  1.4 - 6.5 K/uL Final  . Lymphocytes Relative 07/30/2017 31  % Final  . Lymphs Abs 07/30/2017 1.3  1.0 - 3.6 K/uL Final  . Monocytes Relative 07/30/2017 9  % Final  . Monocytes Absolute 07/30/2017 0.4  0.2 - 1.0 K/uL Final  . Eosinophils Relative 07/30/2017 4  % Final  . Eosinophils Absolute 07/30/2017 0.2  0 - 0.7 K/uL Final  . Basophils Relative 07/30/2017 1  % Final  . 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  He presented with an elevated iron saturation and ferritin.  His mother has hemochromatosis.  Ferritin was 680 on 06/18/2017.  Hematocrit was 35.5, hemoglobin 11.3, MCV 102.2,  platelets 111,000, and WBC 3700 with an ANC of 1900.  Liver function tests were normal.  Iron saturation was 47% with a TIBC of 173.2 (low) on 07/07/2017.  Ferritin has been followed: 680 on 06/18/2017 and 1143 on 07/30/2017.  Sed rate was 31 (0-20) on 07/30/2017.  He has renal failure and has been on dialysis x 2 years.  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  Colonoscopy is scheduled on 08/18/2017.  He is handicapped and is on disability.  Symptomatically, he denies any complaints.   Per his report, he has lost 50 pounds over the past 4-6 months.  Plan: 1.  Discuss diagnosis of hereditary hemochromatosis.  Discuss iron overload and potential cirrhosis, cardiomyopathy, diabetes, and arthropathy.  Discuss small volume phlebotomy and issues with anemia. 2.  ncreased ferritin and iron saturation suggestive of iron overload.  Discuss differential diagnosis.  Given family history, high suspicion for hemochromatosis.  Discuss typical management with phlebotomies.  Discuss current anemia presumably from renal insufficiency. 2.  Discuss macrocytic RBC indices.  Check B12 and folate. 3.  Labs today:  CBC with diff, ferritin, iron studies, sed rate, CRP, hemochromatosis assay. 4.  Follow-up planned colonoscopy at [facility] on 08/18/2017. 5.  RTC in 1 week for MD assessment, review of workup, and +/- small volume phlebotomy.     Lequita Asal, MD  08/13/2017, 6:40 AM   I saw and evaluated the patient, participating in the key portions of the service and reviewing pertinent diagnostic studies and records.  I reviewed the nurse practitioner's note and agree with the findings and the plan.  The assessment and plan were discussed with the patient.  Several questions  were asked by the patient and answered.   Nolon Stalls, MD 08/13/2017,6:40 AM

## 2017-08-20 ENCOUNTER — Telehealth: Payer: Self-pay | Admitting: *Deleted

## 2017-08-20 NOTE — Telephone Encounter (Signed)
Called Dr. Elwyn Lade office and LVM that Dr. Mike Gip would like for Dr. Holley Raring to call her on her cell phone.  Left phone number.  Recording said if the call was after 1 PM, the call would be returned the following day. (Thursday, December 20).

## 2017-08-22 NOTE — Telephone Encounter (Signed)
  Dr Holley Raring and I are playing phone tag.  I will let you know once we connect.  M

## 2017-09-16 ENCOUNTER — Other Ambulatory Visit: Payer: Self-pay | Admitting: Urgent Care

## 2017-09-16 DIAGNOSIS — D539 Nutritional anemia, unspecified: Secondary | ICD-10-CM

## 2017-09-16 DIAGNOSIS — R7989 Other specified abnormal findings of blood chemistry: Secondary | ICD-10-CM

## 2017-09-17 ENCOUNTER — Ambulatory Visit: Payer: Medicare Other | Admitting: Hematology and Oncology

## 2017-09-17 ENCOUNTER — Inpatient Hospital Stay: Payer: Medicare Other

## 2017-09-17 ENCOUNTER — Inpatient Hospital Stay: Payer: Medicare Other | Attending: Hematology and Oncology | Admitting: Hematology and Oncology

## 2017-09-17 DIAGNOSIS — Z992 Dependence on renal dialysis: Secondary | ICD-10-CM | POA: Diagnosis not present

## 2017-09-17 DIAGNOSIS — I120 Hypertensive chronic kidney disease with stage 5 chronic kidney disease or end stage renal disease: Secondary | ICD-10-CM | POA: Diagnosis not present

## 2017-09-17 DIAGNOSIS — D631 Anemia in chronic kidney disease: Secondary | ICD-10-CM | POA: Diagnosis not present

## 2017-09-17 DIAGNOSIS — R1030 Lower abdominal pain, unspecified: Secondary | ICD-10-CM

## 2017-09-17 DIAGNOSIS — R7989 Other specified abnormal findings of blood chemistry: Secondary | ICD-10-CM

## 2017-09-17 DIAGNOSIS — N189 Chronic kidney disease, unspecified: Secondary | ICD-10-CM

## 2017-09-17 DIAGNOSIS — Z86010 Personal history of colonic polyps: Secondary | ICD-10-CM | POA: Diagnosis not present

## 2017-09-17 DIAGNOSIS — D539 Nutritional anemia, unspecified: Secondary | ICD-10-CM

## 2017-09-17 LAB — CBC WITH DIFFERENTIAL/PLATELET
Basophils Absolute: 0 10*3/uL (ref 0–0.1)
Basophils Relative: 1 %
Eosinophils Absolute: 0.1 10*3/uL (ref 0–0.7)
Eosinophils Relative: 4 %
HEMATOCRIT: 29.1 % — AB (ref 40.0–52.0)
HEMOGLOBIN: 10 g/dL — AB (ref 13.0–18.0)
LYMPHS ABS: 1.3 10*3/uL (ref 1.0–3.6)
LYMPHS PCT: 34 %
MCH: 34 pg (ref 26.0–34.0)
MCHC: 34.3 g/dL (ref 32.0–36.0)
MCV: 99.1 fL (ref 80.0–100.0)
MONO ABS: 0.3 10*3/uL (ref 0.2–1.0)
MONOS PCT: 9 %
NEUTROS ABS: 1.9 10*3/uL (ref 1.4–6.5)
NEUTROS PCT: 52 %
Platelets: 112 10*3/uL — ABNORMAL LOW (ref 150–440)
RBC: 2.94 MIL/uL — ABNORMAL LOW (ref 4.40–5.90)
RDW: 15.7 % — AB (ref 11.5–14.5)
WBC: 3.7 10*3/uL — ABNORMAL LOW (ref 3.8–10.6)

## 2017-09-17 LAB — FERRITIN: Ferritin: 636 ng/mL — ABNORMAL HIGH (ref 24–336)

## 2017-09-17 MED ORDER — SODIUM CHLORIDE 0.9% FLUSH
10.0000 mL | INTRAVENOUS | Status: AC | PRN
  Administered 2017-09-17: 10 mL via INTRAVENOUS
  Filled 2017-09-17: qty 10

## 2017-09-17 MED ORDER — HEPARIN SOD (PORK) LOCK FLUSH 100 UNIT/ML IV SOLN
500.0000 [IU] | Freq: Once | INTRAVENOUS | Status: AC
  Administered 2017-09-17: 500 [IU] via INTRAVENOUS

## 2017-09-17 MED ORDER — HEPARIN SOD (PORK) LOCK FLUSH 100 UNIT/ML IV SOLN
INTRAVENOUS | Status: AC
  Filled 2017-09-17: qty 5

## 2017-09-17 NOTE — Progress Notes (Signed)
Patient offers no complaints today.  BP elevated today.  States he has appointment with ENT on Friday for hoarseness.

## 2017-09-17 NOTE — Progress Notes (Signed)
[facility] day:  09/17/2017  Chief Complaint: Ronnie Keith is a 57 y.o. male with an abnormal iron saturation who is seen for review of work-up and discussion regarding direction of therapy.  HPI: The patient was last seen in the hematology clinic on 07/30/2017 for initial consultation.   He was felt to have probable hemochromatosis.  He had an elevated iron saturation and ferritin.  His mother has hemochromatosis.  He has renal failure and is on dialysis.  Work-up on 07/30/2017 revealed two mutations (C282Y and H63D).  Iron saturation was 46% with a TIBC of 202.  Ferritin was 1143.  CRP was < 0.8.  Sed rate was 31 (0-20).  B12 was 448.  Folate was 13.1.  CBC revealed a hematocrit of 34.1, hemoglobin 11.3, MCV 99.7, platelets 107,000, WBC 4100 with an ANC of 2300.  Patient was being considered for a renal transplant at [facility].  Notes from 09/08/2017 revealed caregiving concerns and patient's high risk candidacy. Patient was deemed not to be a candidate at [facility] due to lack of social support system and risks outweighing the benefits.   Colonoscopy on 09/08/2017 at [facility] revealed perianal skin tags. The examined portion of the ileum was normal.  There was one 8 mm polyp in the ascending colon and one 3 mm polyp at the hepatic flexure, removed with a cold snare.  There were internal hemorrhoids. Pathology revealed sessile serrated adenomas that were negative for conventional dysplasia  During the interim, patient experienced lower abdominal pain this morning. He denies bleeding; no hematochezia, melena, or gross hematuria. He advises that he is tired this morning. Patient denies any other physical concerns.   He continues on hemodialysis on Monday, Wednesday, and Friday. After speaking with nephrologist, it was determined that patient was on a protocol where he automatically received intravenous iron replacement due to anemia.  Dr. Holley Raring plans to discontinue  intravenous iron replacement. He receives Procrit.   He is eating well. His weight remains stable. He denies pain in the clinic.    Past Medical History:  Diagnosis Date  . Anxiety   . Chronic kidney disease   . Chronic low back pain   . Hypertension   . Sciatica   . Scoliosis     Past Surgical History:  Procedure Laterality Date  . CLEFT PALATE REPAIR    . EYE SURGERY     "when I was small"  . PERIPHERAL VASCULAR CATHETERIZATION N/A 09/29/2015   Procedure: Dialysis/Perma Catheter Insertion;  Surgeon: Katha Cabal, MD;  Location: [facility];  Service: Cardiovascular;  Laterality: N/A;  . ROTATOR CUFF REPAIR Left     Family History  Problem Relation Age of Onset  . Hypertension Other     Social History:  reports that  has never smoked. he has never used smokeless tobacco. He reports that he does not drink alcohol or use drugs.  Patient is on disability. There have been no known exposures to radiation or toxins.  He lives in [place] with his sister and mother [name]).  He likes to bowl and go "people watching".  His sister has cerebral palsy. The patient is accompanied by his mother today.  Allergies:  Allergies  Allergen Reactions  . Vancomycin Rash    Current Medications: Current Outpatient Medications  Medication Sig Dispense Refill  . acetaminophen (TYLENOL) 325 MG tablet Take 650 mg by mouth every 6 (six) hours as needed for mild pain or moderate  pain.    . aspirin EC 81 MG tablet Take 81 mg by mouth daily.    Marland Kitchen buPROPion (WELLBUTRIN XL) 150 MG 24 hr tablet Take 1 tablet (150 mg total) by mouth daily. 30 tablet 1  . calcium acetate (PHOSLO) 667 MG capsule Take 2 capsules (1,334 mg total) by mouth 3 (three) times daily with meals. 180 capsule 1  . fluticasone (FLONASE) 50 MCG/ACT nasal spray Place 1 spray into both nostrils daily.    Marland Kitchen MELATONIN PO Take 1 tablet by mouth daily.    . metoprolol succinate (TOPROL-XL) 50 MG 24 hr tablet Take 50 mg by mouth  daily.    Marland Kitchen omeprazole (PRILOSEC) 40 MG capsule Take 40 mg by mouth daily.    . QUEtiapine (SEROQUEL) 200 MG tablet Take 1 tablet (200 mg total) by mouth at bedtime. 30 tablet 1  . rOPINIRole (REQUIP) 2 MG tablet Take 1 tablet (2 mg total) by mouth at bedtime. 30 tablet 1  . senna-docusate (SENOKOT-S) 8.6-50 MG tablet Take 2 tablets by mouth 2 (two) times daily. 60 tablet 2  . traMADol (ULTRAM) 50 MG tablet Take 1 tablet (50 mg total) by mouth every 6 (six) hours as needed for severe pain. 30 tablet 0   No current facility-administered medications for this visit.    Facility-Administered Medications Ordered in Other Visits  Medication Dose Route Frequency Provider Last Rate Last Dose  . heparin lock flush 100 unit/mL  500 Units Intravenous Once Corcoran, Melissa C, MD      . sodium chloride flush (NS) 0.9 % injection 10 mL  10 mL Intravenous PRN Lequita Asal, MD   10 mL at 09/17/17 1000    Review of Systems:  GENERAL:  Feels "ok".  No fevers or sweats.  Weight loss of 50 pounds in the past 4-6 months (? Real). Weight stable since last clinic visit.  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  Hoarseness followed by ENT.  No visual changes, runny nose, sore throat, mouth sores or tenderness. Lungs:  No shortness of breath or cough.  No hemoptysis. Cardiac:  No chest pain, palpitations, orthopnea, or PND. GI:  Reflux.  Swallowing difficulties.  Interval lower abdominal pain.  No nausea, vomiting, diarrhea, constipation, melena or hematochezia. GU:  No urgency, frequency, dysuria, or hematuria.  On dialysis. Musculoskeletal:  No back pain.  No joint pain.  No muscle tenderness. Extremities:  No pain or swelling. Skin:  No rashes or skin changes. Neuro:  No headache, numbness or weakness, balance or coordination issues. Endocrine:  No diabetes, thyroid issues, hot flashes or night sweats. Psych:  No mood changes, depression or anxiety. Pain:  No focal pain. Review  of systems:  All other systems reviewed and found to be negative.  Physical Exam: Blood pressure (!) 160/104, pulse 73, temperature (!) 96.1 F (35.6 C), temperature source Tympanic, weight 229 lb 4.5 oz (104 kg). GENERAL:  Well developed, well nourished, gentleman sitting comfortably in the exam room in no acute distress. MENTAL STATUS:  Alert and oriented to person, place and time. HEAD:  Thin dark blonde hair; male pattern baldness.  Normocephalic, atraumatic, face symmetric, no Cushingoid features. EYES:  Glasses.  Dark blue eyes.  No conjunctivitis or scleral icterus. ABDOMEN:  Fully round.  Soft, non-tender, with active bowel sounds, and no appreciable hepatosplenomegaly.  No guarding or rebound tenderness. SKIN:  Fair complexion.  No rashes, ulcers or lesions. EXTREMITIES:  Chronic lower extremity edema.  No skin discoloration or tenderness. NEUROLOGICAL: Unremarkable. PSYCH:  Appropriate.   Infusion on 09/17/2017  Component Date Value Ref Range Status  . WBC 09/17/2017 3.7* 3.8 - 10.6 K/uL Final  . RBC 09/17/2017 2.94* 4.40 - 5.90 MIL/uL Final  . Hemoglobin 09/17/2017 10.0* 13.0 - 18.0 g/dL Final  . HCT 09/17/2017 29.1* 40.0 - 52.0 % Final  . MCV 09/17/2017 99.1  80.0 - 100.0 fL Final  . MCH 09/17/2017 34.0  26.0 - 34.0 pg Final  . MCHC 09/17/2017 34.3  32.0 - 36.0 g/dL Final  . RDW 09/17/2017 15.7* 11.5 - 14.5 % Final  . Platelets 09/17/2017 112* 150 - 440 K/uL Final  . Neutrophils Relative % 09/17/2017 52  % Final  . Neutro Abs 09/17/2017 1.9  1.4 - 6.5 K/uL Final  . Lymphocytes Relative 09/17/2017 34  % Final  . Lymphs Abs 09/17/2017 1.3  1.0 - 3.6 K/uL Final  . Monocytes Relative 09/17/2017 9  % Final  . Monocytes Absolute 09/17/2017 0.3  0.2 - 1.0 K/uL Final  . Eosinophils Relative 09/17/2017 4  % Final  . Eosinophils Absolute 09/17/2017 0.1  0 - 0.7 K/uL Final  . Basophils Relative 09/17/2017 1  % Final  . 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  He presented with an elevated iron saturation (47%) and ferritin.  His mother has hemochromatosis.  Work-up on 07/30/2017 revealed two mutations (C282Y and H63D).  Iron saturation was 46% with a TIBC of 202.  Ferritin was 1143.  CRP was < 0.8.  Sed rate was 31 (0-20).  B12 was 448.  Folate was 13.1.  CBC revealed a hematocrit of 34.1, hemoglobin 11.3, MCV 99.7, platelets 107,000, WBC 4100 with an ANC of 2300.  Ferritin has been followed: 680 on 06/18/2017, 1143 on 07/30/2017, and 636 on 09/17/2017.  Ferritin goal is < 50.  He has renal failure and has been on dialysis x 2 years.  He has associated anemia of chronic renal disease.  He receives Procrit.  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  Colonoscopy on 09/08/2017 at [facility] revealed an 8 mm polyp in the ascending colon and 3 mm polyp at the hepatic flexure, removed with a cold snare.  There were internal hemorrhoids. Pathology revealed sessile serrated adenomas that were negative for conventional dysplasia  He is handicapped and is on disability.  Symptomatically, he has had some lower abdominal pain. Exam is stable. WBC is 3700 with and ANC of 1900. Hemoglobin is 10.0 and hematocrit 29.1.  Plan: 1.  Labs today:  CBC with diff, ferritin, AFP. 2.  Review work-up and diagnosis of hemochromatosis.  Discuss small volume phlebotomies as tolerated by hemoglobin.  Goal ferritin is < 50. 3.  Review colonoscopy. Patient had 2 polyps removed. Pathology revealed sessile serrated adenomas that were negative for conventional dysplasia 4.  Hematocrit is borderline low at 29.1 today. Will hold small volume phlebotomy. Nephrology to discontinue intravenous iron protocol and titrate Procit dose. Anticipate  phlebotomy next week. 5.  Discuss interval monitoring of AFP and RUQ abdominal ultrasound every 6 months.   6.  Schedule RUQ ultrasound. 7.  RTC in 1 week for MD assessment, review of workup, and +/-  small volume phlebotomy.     Honor Loh, NP  09/17/2017, 11:08 AM   I saw and evaluated the patient, participating in the key portions of the service and reviewing pertinent diagnostic studies and records.  I reviewed the nurse practitioner's note and agree with the findings and the plan.  The assessment and plan were discussed with the patient.  Several questions were asked by the patient and answered.   Nolon Stalls, MD 09/17/2017,11:08 AM

## 2017-09-18 LAB — AFP TUMOR MARKER: AFP, SERUM, TUMOR MARKER: 1.2 ng/mL (ref 0.0–8.3)

## 2017-09-22 ENCOUNTER — Ambulatory Visit
Admission: RE | Admit: 2017-09-22 | Discharge: 2017-09-22 | Disposition: A | Discharge status: Home / Self Care | Payer: Medicare Other | Source: Ambulatory Visit | Attending: Urgent Care | Admitting: Urgent Care

## 2017-09-24 ENCOUNTER — Ambulatory Visit: Payer: Self-pay | Admitting: Hematology and Oncology

## 2017-10-01 ENCOUNTER — Inpatient Hospital Stay: Payer: Medicare Other

## 2017-10-01 ENCOUNTER — Other Ambulatory Visit: Payer: Self-pay | Admitting: Hematology and Oncology

## 2017-10-01 ENCOUNTER — Other Ambulatory Visit: Payer: Self-pay

## 2017-10-01 ENCOUNTER — Inpatient Hospital Stay (HOSPITAL_BASED_OUTPATIENT_CLINIC_OR_DEPARTMENT_OTHER): Payer: Medicare Other | Admitting: Hematology and Oncology

## 2017-10-01 ENCOUNTER — Encounter: Payer: Self-pay | Admitting: Emergency Medicine

## 2017-10-01 ENCOUNTER — Ambulatory Visit
Admission: EM | Admit: 2017-10-01 | Discharge: 2017-10-01 | Disposition: A | Discharge status: Home / Self Care | Payer: Medicare Other | Attending: Family Medicine | Admitting: Family Medicine

## 2017-10-01 ENCOUNTER — Encounter: Payer: Self-pay | Admitting: Hematology and Oncology

## 2017-10-01 VITALS — BP 188/105 | HR 77

## 2017-10-01 DIAGNOSIS — N189 Chronic kidney disease, unspecified: Secondary | ICD-10-CM

## 2017-10-01 DIAGNOSIS — D631 Anemia in chronic kidney disease: Secondary | ICD-10-CM | POA: Diagnosis not present

## 2017-10-01 DIAGNOSIS — I10 Essential (primary) hypertension: Secondary | ICD-10-CM

## 2017-10-01 DIAGNOSIS — Z86010 Personal history of colonic polyps: Secondary | ICD-10-CM

## 2017-10-01 DIAGNOSIS — R1030 Lower abdominal pain, unspecified: Secondary | ICD-10-CM | POA: Diagnosis not present

## 2017-10-01 DIAGNOSIS — Z992 Dependence on renal dialysis: Secondary | ICD-10-CM | POA: Diagnosis not present

## 2017-10-01 DIAGNOSIS — Z95828 Presence of other vascular implants and grafts: Secondary | ICD-10-CM

## 2017-10-01 DIAGNOSIS — I120 Hypertensive chronic kidney disease with stage 5 chronic kidney disease or end stage renal disease: Secondary | ICD-10-CM

## 2017-10-01 LAB — CBC
HCT: 33.5 % — ABNORMAL LOW (ref 40.0–52.0)
Hemoglobin: 11.3 g/dL — ABNORMAL LOW (ref 13.0–18.0)
MCH: 33.8 pg (ref 26.0–34.0)
MCHC: 33.6 g/dL (ref 32.0–36.0)
MCV: 100.8 fL — ABNORMAL HIGH (ref 80.0–100.0)
Platelets: 122 10*3/uL — ABNORMAL LOW (ref 150–440)
RBC: 3.32 MIL/uL — ABNORMAL LOW (ref 4.40–5.90)
RDW: 16.9 % — ABNORMAL HIGH (ref 11.5–14.5)
WBC: 3.8 10*3/uL (ref 3.8–10.6)

## 2017-10-01 MED ORDER — HEPARIN SOD (PORK) LOCK FLUSH 100 UNIT/ML IV SOLN
INTRAVENOUS | Status: AC
Start: 2017-10-01 — End: ?
  Filled 2017-10-01: qty 5

## 2017-10-01 MED ORDER — CLONIDINE HCL 0.1 MG PO TABS
0.1000 mg | ORAL_TABLET | Freq: Once | ORAL | Status: AC
  Administered 2017-10-01: 0.1 mg via ORAL

## 2017-10-01 MED ORDER — SODIUM CHLORIDE 0.9% FLUSH
10.0000 mL | INTRAVENOUS | Status: DC | PRN
  Administered 2017-10-01: 10 mL via INTRAVENOUS
  Filled 2017-10-01: qty 10

## 2017-10-01 MED ORDER — HEPARIN SOD (PORK) LOCK FLUSH 100 UNIT/ML IV SOLN
500.0000 [IU] | Freq: Once | INTRAVENOUS | Status: AC
  Administered 2017-10-01: 500 [IU] via INTRAVENOUS

## 2017-10-01 NOTE — Progress Notes (Signed)
Patient offers no complaints today.  Patient's BP elevated today 186/126 HR 77  Rechecked 165/112 HR 76.

## 2017-10-01 NOTE — ED Provider Notes (Signed)
[facility]    CSN: [id number] Arrival date & time: 10/01/17  1237     History   Chief Complaint Chief Complaint  Patient presents with  . Hypertension    HPI Ronnie Keith is a 57 y.o. male.   57 yo male with a h/o multiple medical problems including chronic renal failure and hypertension presents with a c/o high blood pressure readings for the last couple of days. Today came in to cancer center and had a systolic blood pressure reading of 200. Patient denies any chest pain, shortness of breath, vision changes, numbness/tingling. States he has dialysis three times per week and his next one is tomorrow.    The history is provided by the patient.  Hypertension     Past Medical History:  Diagnosis Date  . Anxiety   . Chronic kidney disease   . Chronic low back pain   . Hypertension   . Sciatica   . Scoliosis     Patient Active Problem List   Diagnosis Date Noted  . 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 Elevated ferritin 07/30/2017  . Macrocytic anemia 07/30/2017  . Chronic kidney disease 07/17/2016  . Intellectual disability 07/16/2016  . Major depressive disorder, recurrent severe without psychotic features ([place]) 09/28/2015  . Hyperkalemia 09/20/2015    Past Surgical History:  Procedure Laterality Date  . CLEFT PALATE REPAIR    . EYE SURGERY     "when I was small"  . PERIPHERAL VASCULAR CATHETERIZATION N/A 09/29/2015   Procedure: Dialysis/Perma Catheter Insertion;  Surgeon: Katha Cabal, MD;  Location: [facility];  Service: Cardiovascular;  Laterality: N/A;  . ROTATOR CUFF REPAIR Left        Home Medications    Prior to Admission medications   Medication Sig Start Date End Date Taking? Authorizing Provider  acetaminophen (TYLENOL) 325 MG tablet Take 650 mg by mouth every 6 (six) hours as needed for mild pain or moderate pain.   Yes [provider]  aspirin EC 81  MG tablet Take 81 mg by mouth daily.   Yes [provider]  buPROPion (WELLBUTRIN XL) 150 MG 24 hr tablet Take 1 tablet (150 mg total) by mouth daily. 07/30/16  Yes Pucilowska, Jolanta B, MD  calcium acetate (PHOSLO) 667 MG capsule Take 2 capsules (1,334 mg total) by mouth 3 (three) times daily with meals. 07/29/16  Yes Pucilowska, Jolanta B, MD  fluticasone (FLONASE) 50 MCG/ACT nasal spray Place 1 spray into both nostrils daily.   Yes [provider]  MELATONIN PO Take 1 tablet by mouth daily.   Yes [provider]  metoprolol succinate (TOPROL-XL) 50 MG 24 hr tablet Take 50 mg by mouth daily. 03/28/16 05/06/21 Yes [provider]  omeprazole (PRILOSEC) 40 MG capsule Take 40 mg by mouth daily.   Yes [provider]  QUEtiapine (SEROQUEL) 200 MG tablet Take 1 tablet (200 mg total) by mouth at bedtime. 07/29/16  Yes Pucilowska, Jolanta B, MD  rOPINIRole (REQUIP) 2 MG tablet Take 1 tablet (2 mg total) by mouth at bedtime. 07/29/16  Yes Pucilowska, Jolanta B, MD  senna-docusate (SENOKOT-S) 8.6-50 MG tablet Take 2 tablets by mouth 2 (two) times daily. 10/07/15  Yes Gladstone Lighter, MD  traMADol (ULTRAM) 50 MG tablet Take 1 tablet (50 mg total) by mouth every 6 (six) hours as needed for severe pain. 07/29/16  Yes Pucilowska, Wardell Honour, MD    Family History Family  History  Problem Relation Age of Onset  . Hypertension Other     Social History Social History   Tobacco Use  . Smoking status: Never Smoker  . Smokeless tobacco: Never Used  Substance Use Topics  . Alcohol use: No  . Drug use: No     Allergies   Vancomycin   Review of Systems Review of Systems   Physical Exam Triage Vital Signs ED Triage Vitals  Enc Vitals Group     BP 10/01/17 1249 (!) 178/99     Pulse Rate 10/01/17 1249 72     Resp 10/01/17 1249 16     Temp 10/01/17 1249 97.6 F (36.4 C)     Temp Source 10/01/17 1249 Oral     SpO2 10/01/17 1249 100 %     Weight  10/01/17 1246 222 lb (100.7 kg)     Height 10/01/17 1246 5\' 10"  (1.778 m)     Head Circumference --      Peak Flow --      Pain Score 10/01/17 1246 0     Pain Loc --      Pain Edu? --      Excl. in [place]? --    No data found.  Updated Vital Signs BP (!) 145/81 (BP Location: Right Arm)   Pulse 66   Temp 97.6 F (36.4 C) (Oral)   Resp 16   Ht 5\' 10"  (1.778 m)   Wt 222 lb (100.7 kg)   SpO2 100%   BMI 31.85 kg/m   Visual Acuity Right Eye Distance:   Left Eye Distance:   Bilateral Distance:    Right Eye Near:   Left Eye Near:    Bilateral Near:     Physical Exam  Constitutional: He appears well-developed and well-nourished. No distress.  Cardiovascular: Normal rate, regular rhythm and normal heart sounds.  Pulmonary/Chest: Effort normal and breath sounds normal. No stridor. No respiratory distress. He has no wheezes. He has no rales.  Skin: He is not diaphoretic.  Nursing note and vitals reviewed.    UC Treatments / Results  Labs (all labs ordered are listed, but only abnormal results are displayed) Labs Reviewed - No data to display  EKG  EKG Interpretation None       Radiology No results found.  Procedures Procedures (including critical care time)  Medications Ordered in UC Medications  cloNIDine (CATAPRES) tablet 0.1 mg (0.1 mg Oral Given 10/01/17 1308)     Initial Impression / Assessment and Plan / UC Course  I have reviewed the triage vital signs and the nursing notes.  Pertinent labs & imaging results that were available during my care of the patient were reviewed by me and considered in my medical decision making (see chart for details).       Final Clinical Impressions(s) / UC Diagnoses   Final diagnoses:  Essential hypertension    ED Discharge Orders    None     1. diagnosis reviewed with patient and family 2.patient given clonidine 0.1mg  po x 1 with improvement of blood pressure 3. Recommend continue current home medications 4.  Follow up with PCP and nephrologist 5.  Follow-up prn if symptoms worsen or don't improve  Controlled Substance Prescriptions [place] Controlled Substance Registry consulted? Not Applicable   Norval Gable, MD 10/01/17 1416

## 2017-10-01 NOTE — Discharge Instructions (Signed)
Follow up with Primary Care and kidney specialist

## 2017-10-01 NOTE — Progress Notes (Signed)
Patient taken to Urgent Care per Dr. Kem Parkinson instruction due to his elevated BP.

## 2017-10-01 NOTE — ED Triage Notes (Signed)
Patient c/o elevated blood pressure reading for several days.  Patient states that he was seen at the cancer center today and had elevated BP reading.

## 2017-10-01 NOTE — Progress Notes (Signed)
[facility] day:  10/01/2017  Chief Complaint: Ronnie Keith is a 57 y.o. male with end stage renal disease on dialysis, hemochromatosis and anemia of chronic renal disease who is seen for review of interval ultrasound and possible phlebotomy.  HPI: The patient was last seen in the hematology clinic on 09/17/2017.   Work-up confirmed hemochromatosis (compound heterozygote).  We discussed a phlebotomy program to maintain a ferritin < 50.  Difficulty was anticipated given his anemia of chronic renal disease.  We discussed small volume phlebotomy if his hemoglobin was > 11.   RUQ ultrasound on 09/22/2017 a normal hepatic echotexture.  There was no mass.  Right renal atrophy was noted (old finding).  AFP was 1.2.  Symptomatically, he denies any complaints.   Past Medical History:  Diagnosis Date  . Anxiety   . Chronic kidney disease   . Chronic low back pain   . Hypertension   . Sciatica   . Scoliosis     Past Surgical History:  Procedure Laterality Date  . CLEFT PALATE REPAIR    . EYE SURGERY     "when I was small"  . PERIPHERAL VASCULAR CATHETERIZATION N/A 09/29/2015   Procedure: Dialysis/Perma Catheter Insertion;  Surgeon: Katha Cabal, MD;  Location: [facility];  Service: Cardiovascular;  Laterality: N/A;  . ROTATOR CUFF REPAIR Left     Family History  Problem Relation Age of Onset  . Hypertension Other     Social History:  reports that  has never smoked. he has never used smokeless tobacco. He reports that he does not drink alcohol or use drugs.  Patient is on disability. There have been no known exposures to radiation or toxins.  He lives in [place] with his sister and mother [name]).  He likes to bowl and go "people watching".  His sister has cerebral palsy. The patient is accompanied by his mother today.  Allergies:  Allergies  Allergen Reactions  . Vancomycin Rash    Current Medications: Current Outpatient  Medications  Medication Sig Dispense Refill  . acetaminophen (TYLENOL) 325 MG tablet Take 650 mg by mouth every 6 (six) hours as needed for mild pain or moderate pain.    Marland Kitchen aspirin EC 81 MG tablet Take 81 mg by mouth daily.    Marland Kitchen buPROPion (WELLBUTRIN XL) 150 MG 24 hr tablet Take 1 tablet (150 mg total) by mouth daily. 30 tablet 1  . calcium acetate (PHOSLO) 667 MG capsule Take 2 capsules (1,334 mg total) by mouth 3 (three) times daily with meals. 180 capsule 1  . fluticasone (FLONASE) 50 MCG/ACT nasal spray Place 1 spray into both nostrils daily.    Marland Kitchen MELATONIN PO Take 1 tablet by mouth daily.    . metoprolol succinate (TOPROL-XL) 50 MG 24 hr tablet Take 50 mg by mouth daily.    Marland Kitchen omeprazole (PRILOSEC) 40 MG capsule Take 40 mg by mouth daily.    . QUEtiapine (SEROQUEL) 200 MG tablet Take 1 tablet (200 mg total) by mouth at bedtime. 30 tablet 1  . rOPINIRole (REQUIP) 2 MG tablet Take 1 tablet (2 mg total) by mouth at bedtime. 30 tablet 1  . senna-docusate (SENOKOT-S) 8.6-50 MG tablet Take 2 tablets by mouth 2 (two) times daily. 60 tablet 2  . traMADol (ULTRAM) 50 MG tablet Take 1 tablet (50 mg total) by mouth every 6 (six) hours as needed for severe pain. 30 tablet 0   No current facility-administered  medications for this visit.    Facility-Administered Medications Ordered in Other Visits  Medication Dose Route Frequency Provider Last Rate Last Dose  . sodium chloride flush (NS) 0.9 % injection 10 mL  10 mL Intravenous PRN Lequita Asal, MD   10 mL at 09/17/17 1000    Review of Systems:  GENERAL:  Feels "ok".  No fevers or sweats.  Weight loss of 50 pounds in the past 4-6 months (? Real). Weight down 5 pounds since last visit.  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  Hoarseness followed by ENT.  No visual changes, runny nose, sore throat, mouth sores or tenderness. Lungs:  Shortness of breath on exertion.  No cough.  No hemoptysis. Cardiac:  No chest pain,  palpitations, orthopnea, or PND. GI:  Reflux.  Swallowing difficulties.  No nausea, vomiting, diarrhea, constipation, melena or hematochezia. GU:  No urgency, frequency, dysuria, or hematuria.  On dialysis. Musculoskeletal:  No back pain.  No joint pain.  No muscle tenderness. Extremities:  No pain or swelling. Skin:  No rashes or skin changes. Neuro:  No headache, numbness or weakness, balance or coordination issues. Endocrine:  No diabetes, thyroid issues, hot flashes or night sweats. Psych:  No mood changes, depression or anxiety. Pain:  No focal pain. Review of systems:  All other systems reviewed and found to be negative.  Physical Exam: Blood pressure (!) 165/112, pulse 76, temperature (!) 97.1 F (36.2 C), temperature source Tympanic, resp. rate 20, weight 224 lb 3.3 oz (101.7 kg), SpO2 100 %. GENERAL:  Well developed, well nourished, gentleman sitting comfortably in the exam room in no acute distress. MENTAL STATUS:  Alert and oriented to person, place and time. HEAD:  Thin dark blonde hair; male pattern baldness.  Normocephalic, atraumatic, face symmetric, no Cushingoid features. EYES:  Glasses.  Dark blue eyes.  No conjunctivitis or scleral icterus. ENT:  Oropharynx clear without lesion.  Tongue normal.  Reconstructed uvula.  Missing lower tooth.  Mucous membranes moist.  RESPIRATORY:  Clear to auscultation without rales, wheezes or rhonchi. CARDIOVASCULAR:  Regular rate and rhythm without murmur, rub or gallop. CHEST:  Right sided dialysis catheter. SKIN:  Fair complexion.  No rashes, ulcers or lesions. EXTREMITIES:  Chronic lower extremity edema.  No skin discoloration or tenderness.  No palpable cords. NEUROLOGICAL: Unremarkable. PSYCH:  Appropriate.   Office Visit on 10/01/2017  Component Date Value Ref Range Status  . WBC 10/01/2017 3.8  3.8 - 10.6 K/uL Final  . RBC 10/01/2017 3.32* 4.40 - 5.90 MIL/uL Final  . Hemoglobin 10/01/2017 11.3* 13.0 - 18.0 g/dL Final  . HCT  10/01/2017 33.5* 40.0 - 52.0 % Final  . MCV 10/01/2017 100.8* 80.0 - 100.0 fL Final  . MCH 10/01/2017 33.8  26.0 - 34.0 pg Final  . MCHC 10/01/2017 33.6  32.0 - 36.0 g/dL Final  . RDW 10/01/2017 16.9* 11.5 - 14.5 % Final  . Platelets 10/01/2017 122* 150 - 440 K/uL Final   Performed at [facility], [address], [place], [place] [address]    Assessment:  DAMARIO GILLIE is a 57 y.o. male with hemochromatosis.  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  He presented with an elevated iron saturation (47%) and ferritin.  His mother has hemochromatosis.  Work-up on 07/30/2017 revealed two mutations (C282Y and H63D).  Iron saturation was 46% with a TIBC of 202.  Ferritin was 1143.  CRP was < 0.8.  Sed  rate was 31 (0-20).  B12 was 448.  Folate was 13.1.  CBC revealed a hematocrit of 34.1, hemoglobin 11.3, MCV 99.7, platelets 107,000, WBC 4100 with an ANC of 2300.  Ferritin has been followed: 680 on 06/18/2017, 1143 on 07/30/2017, and 636 on 09/17/2017.  Ferritin goal is < 50.  He has renal failure and has been on dialysis x 2 years.  He has associated anemia of chronic renal disease.  He receives Procrit.  RUQ ultrasound on 09/22/2017 a normal hepatic echotexture.  There was no mass.  AFP was 1.2 on 09/17/2017.  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  Colon[facility] on 09/08/2017 at [facility] revealed an 8 mm polyp in the ascending colon and 3 mm polyp at the hepatic flexure, removed with a cold snare.  There were internal hemorrhoids. Pathology revealed sessile serrated adenomas that were negative for conventional dysplasia  He is handicapped and is on disability.  Symptomatically, he denies any complaint.  Blood pressure is elevated.  Exam is stable. Hemoglobin is 11.3 and hematocrit 33.5.  Plan: 1.  Labs today:  CBC. 2.  Review interval abdominal ultrasound and AFP.  Liver and AFP normal.  Continue every 6 month screening. 3.   Discuss plan for small volume (150 cc) phlebotomy every 2 weeks as long as HCT > 33 and Hgb > 11.  Goal ferritin < 50. 4.  Discuss hypertension.  Despite phlebotomy, blood pressure remains extremely elevated.  Patient to go to acute care clinic today. 5.  Phlebotomy 150 cc today 6.  RTC every 2 weeks for CBC +/- phlebotomy 7.  RTC in 6 weeks for MD assessment, labs (CBC with diff, ferritin), and +/- phlebotomy.   Lequita Asal, MD  10/01/2017, 4:35 PM

## 2017-10-04 ENCOUNTER — Telehealth: Payer: Self-pay

## 2017-10-04 NOTE — Telephone Encounter (Signed)
Called to follow up with patient since visit here at [facility]. Patient instructed to call back with any questions or concerns. MAH  

## 2017-10-05 ENCOUNTER — Encounter: Payer: Self-pay | Admitting: Hematology and Oncology

## 2017-10-15 ENCOUNTER — Inpatient Hospital Stay: Payer: Medicare Other

## 2017-10-15 ENCOUNTER — Inpatient Hospital Stay: Payer: Medicare Other | Attending: Hematology and Oncology

## 2017-10-15 VITALS — BP 156/87 | HR 71 | Temp 97.2°F | Resp 18

## 2017-10-15 DIAGNOSIS — Z452 Encounter for adjustment and management of vascular access device: Secondary | ICD-10-CM

## 2017-10-15 LAB — CBC
HCT: 35.5 % — ABNORMAL LOW (ref 40.0–52.0)
Hemoglobin: 12 g/dL — ABNORMAL LOW (ref 13.0–18.0)
MCH: 34 pg (ref 26.0–34.0)
MCHC: 33.9 g/dL (ref 32.0–36.0)
MCV: 100.3 fL — ABNORMAL HIGH (ref 80.0–100.0)
Platelets: 120 10*3/uL — ABNORMAL LOW (ref 150–440)
RBC: 3.54 MIL/uL — ABNORMAL LOW (ref 4.40–5.90)
RDW: 16.6 % — ABNORMAL HIGH (ref 11.5–14.5)
WBC: 4.2 10*3/uL (ref 3.8–10.6)

## 2017-10-15 MED ORDER — HEPARIN SOD (PORK) LOCK FLUSH 100 UNIT/ML IV SOLN
INTRAVENOUS | Status: AC
  Filled 2017-10-15: qty 5

## 2017-10-15 MED ORDER — HEPARIN SOD (PORK) LOCK FLUSH 100 UNIT/ML IV SOLN
500.0000 [IU] | Freq: Once | INTRAVENOUS | Status: AC
  Administered 2017-10-15: 500 [IU] via INTRAVENOUS

## 2017-10-15 MED ORDER — SODIUM CHLORIDE 0.9% FLUSH
10.0000 mL | INTRAVENOUS | Status: DC | PRN
  Administered 2017-10-15: 10 mL via INTRAVENOUS
  Filled 2017-10-15: qty 10

## 2017-10-29 ENCOUNTER — Inpatient Hospital Stay: Payer: Medicare Other

## 2017-10-29 LAB — CBC
HCT: 36.5 % — ABNORMAL LOW (ref 40.0–52.0)
Hemoglobin: 12.4 g/dL — ABNORMAL LOW (ref 13.0–18.0)
MCH: 34.3 pg — ABNORMAL HIGH (ref 26.0–34.0)
MCHC: 33.8 g/dL (ref 32.0–36.0)
MCV: 101.4 fL — ABNORMAL HIGH (ref 80.0–100.0)
Platelets: 103 10*3/uL — ABNORMAL LOW (ref 150–440)
RBC: 3.6 MIL/uL — ABNORMAL LOW (ref 4.40–5.90)
RDW: 16.9 % — ABNORMAL HIGH (ref 11.5–14.5)
WBC: 3.8 10*3/uL (ref 3.8–10.6)

## 2017-10-29 MED ORDER — SODIUM CHLORIDE 0.9% FLUSH
10.0000 mL | INTRAVENOUS | Status: DC | PRN
  Administered 2017-10-29: 10 mL via INTRAVENOUS
  Filled 2017-10-29: qty 10

## 2017-10-29 MED ORDER — HEPARIN SOD (PORK) LOCK FLUSH 100 UNIT/ML IV SOLN
500.0000 [IU] | Freq: Once | INTRAVENOUS | Status: AC
  Administered 2017-10-29: 500 [IU] via INTRAVENOUS
  Filled 2017-10-29: qty 5

## 2017-10-29 NOTE — Progress Notes (Unsigned)
Patient felt" funny", "lightheaded "after his phlebotomy. Gave him cola and peanutbutter crackers. Observed patient for an additional 30 minutes until dizziness subsided. VSS.

## 2017-11-19 ENCOUNTER — Inpatient Hospital Stay: Payer: Medicare Other

## 2017-11-19 ENCOUNTER — Ambulatory Visit: Payer: Self-pay | Admitting: Hematology and Oncology

## 2017-11-19 ENCOUNTER — Inpatient Hospital Stay: Payer: Medicare Other | Attending: Hematology and Oncology | Admitting: Hematology and Oncology

## 2017-11-19 ENCOUNTER — Other Ambulatory Visit: Payer: Self-pay

## 2017-11-19 ENCOUNTER — Encounter: Payer: Self-pay | Admitting: Hematology and Oncology

## 2017-11-19 DIAGNOSIS — Z7982 Long term (current) use of aspirin: Secondary | ICD-10-CM | POA: Diagnosis not present

## 2017-11-19 DIAGNOSIS — Z992 Dependence on renal dialysis: Secondary | ICD-10-CM | POA: Diagnosis not present

## 2017-11-19 DIAGNOSIS — I120 Hypertensive chronic kidney disease with stage 5 chronic kidney disease or end stage renal disease: Secondary | ICD-10-CM | POA: Insufficient documentation

## 2017-11-19 DIAGNOSIS — Z79899 Other long term (current) drug therapy: Secondary | ICD-10-CM | POA: Diagnosis not present

## 2017-11-19 DIAGNOSIS — D631 Anemia in chronic kidney disease: Secondary | ICD-10-CM | POA: Diagnosis not present

## 2017-11-19 DIAGNOSIS — N186 End stage renal disease: Secondary | ICD-10-CM | POA: Diagnosis not present

## 2017-11-19 LAB — CBC WITH DIFFERENTIAL/PLATELET
Basophils Absolute: 0 10*3/uL (ref 0–0.1)
Basophils Relative: 1 %
Eosinophils Absolute: 0.2 10*3/uL (ref 0–0.7)
Eosinophils Relative: 4 %
HCT: 34.2 % — ABNORMAL LOW (ref 40.0–52.0)
Hemoglobin: 11.7 g/dL — ABNORMAL LOW (ref 13.0–18.0)
Lymphocytes Relative: 33 %
Lymphs Abs: 1.4 10*3/uL (ref 1.0–3.6)
MCH: 34 pg (ref 26.0–34.0)
MCHC: 34.2 g/dL (ref 32.0–36.0)
MCV: 99.3 fL (ref 80.0–100.0)
Monocytes Absolute: 0.4 10*3/uL (ref 0.2–1.0)
Monocytes Relative: 9 %
Neutro Abs: 2.3 10*3/uL (ref 1.4–6.5)
Neutrophils Relative %: 53 %
Platelets: 102 10*3/uL — ABNORMAL LOW (ref 150–440)
RBC: 3.44 MIL/uL — ABNORMAL LOW (ref 4.40–5.90)
RDW: 15.8 % — ABNORMAL HIGH (ref 11.5–14.5)
WBC: 4.2 10*3/uL (ref 3.8–10.6)

## 2017-11-19 LAB — FERRITIN: Ferritin: 537 ng/mL — ABNORMAL HIGH (ref 24–336)

## 2017-11-19 MED ORDER — HEPARIN SOD (PORK) LOCK FLUSH 100 UNIT/ML IV SOLN
500.0000 [IU] | Freq: Once | INTRAVENOUS | Status: AC
  Administered 2017-11-19: 500 [IU] via INTRAVENOUS

## 2017-11-19 MED ORDER — SODIUM CHLORIDE 0.9% FLUSH
10.0000 mL | INTRAVENOUS | Status: DC | PRN
  Administered 2017-11-19: 10 mL via INTRAVENOUS
  Filled 2017-11-19: qty 10

## 2017-11-19 NOTE — Progress Notes (Signed)
Patient offers no complaints today. 

## 2017-11-19 NOTE — Progress Notes (Signed)
[facility] day:  11/19/2017  Chief Complaint: Ronnie Keith is a 57 y.o. male with end stage renal disease on dialysis, hemochromatosis and anemia of chronic renal disease who is seen for 6 week assessment  HPI: The patient was last seen in the hematology clinic on 10/01/2017.   At that time, he denied any complaint.  Blood pressure was elevated.  Exam was stable. Hemoglobin was 11.3 and hematocrit 33.5.  He began small volume phlebotomies.  He underwent phlebotomy on 10/15/2017 and 10/29/2017.  During the interim, patient notes that things have been "going alright". Patient denies any acute concerns today. He denies chest pain and shortness of breath. Patient denies bleeding; no hematochezia, melena, or gross hematuria.  He is eating well. His weight is up 2 pounds.   Patient denies pain in the clinic today.    Past Medical History:  Diagnosis Date  . Anxiety   . Chronic kidney disease   . Chronic low back pain   . Hypertension   . Sciatica   . Scoliosis     Past Surgical History:  Procedure Laterality Date  . CLEFT PALATE REPAIR    . EYE SURGERY     "when I was small"  . PERIPHERAL VASCULAR CATHETERIZATION N/A 09/29/2015   Procedure: Dialysis/Perma Catheter Insertion;  Surgeon: Katha Cabal, MD;  Location: [facility];  Service: Cardiovascular;  Laterality: N/A;  . ROTATOR CUFF REPAIR Left     Family History  Problem Relation Age of Onset  . Hypertension Other     Social History:  reports that  has never smoked. he has never used smokeless tobacco. He reports that he does not drink alcohol or use drugs.  Patient is on disability. There have been no known exposures to radiation or toxins.  He lives in [place] with his sister and mother Ronnie Keith).  He likes to bowl and go "people watching".  His sister has cerebral palsy. The patient is accompanied by his mother today.  Allergies:  Allergies  Allergen Reactions  .  Vancomycin Rash    Current Medications: Current Outpatient Medications  Medication Sig Dispense Refill  . acetaminophen (TYLENOL) 325 MG tablet Take 650 mg by mouth every 6 (six) hours as needed for mild pain or moderate pain.    Marland Kitchen aspirin EC 81 MG tablet Take 81 mg by mouth daily.    Marland Kitchen buPROPion (WELLBUTRIN XL) 150 MG 24 hr tablet Take 1 tablet (150 mg total) by mouth daily. 30 tablet 1  . calcium acetate (PHOSLO) 667 MG capsule Take 2 capsules (1,334 mg total) by mouth 3 (three) times daily with meals. 180 capsule 1  . fluticasone (FLONASE) 50 MCG/ACT nasal spray Place 1 spray into both nostrils daily.    Marland Kitchen MELATONIN PO Take 1 tablet by mouth daily.    . metoprolol succinate (TOPROL-XL) 50 MG 24 hr tablet Take 50 mg by mouth daily.    Marland Kitchen omeprazole (PRILOSEC) 40 MG capsule Take 40 mg by mouth daily.    . QUEtiapine (SEROQUEL) 200 MG tablet Take 1 tablet (200 mg total) by mouth at bedtime. 30 tablet 1  . rOPINIRole (REQUIP) 2 MG tablet Take 1 tablet (2 mg total) by mouth at bedtime. 30 tablet 1  . senna-docusate (SENOKOT-S) 8.6-50 MG tablet Take 2 tablets by mouth 2 (two) times daily. 60 tablet 2  . traMADol (ULTRAM) 50 MG tablet Take 1 tablet (50 mg total) by mouth every 6 (  six) hours as needed for severe pain. 30 tablet 0   No current facility-administered medications for this visit.    Facility-Administered Medications Ordered in Other Visits  Medication Dose Route Frequency Provider Last Rate Last Dose  . sodium chloride flush (NS) 0.9 % injection 10 mL  10 mL Intravenous PRN Lequita Asal, MD   10 mL at 09/17/17 1000    Review of Systems:  GENERAL:  Feels "alright".  No fevers or sweats.  Weight loss of 50 pounds in the past 4-6 months (? real). Weight up 2 pounds since last visit.  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  Hoarseness followed by ENT.  No visual changes, runny nose, sore throat, mouth sores or tenderness. Lungs:  Shortness of breath on  exertion.  No cough.  No hemoptysis. Cardiac:  No chest pain, palpitations, orthopnea, or PND. GI:  Reflux.  Swallowing difficulties.  No nausea, vomiting, diarrhea, constipation, melena or hematochezia. GU:  No urgency, frequency, dysuria, or hematuria.  On dialysis. Musculoskeletal:  No back pain.  No joint pain.  No muscle tenderness. Extremities:  No pain or swelling. Skin:  No rashes or skin changes. Neuro:  No headache, numbness or weakness, balance or coordination issues. Endocrine:  No diabetes, thyroid issues, hot flashes or night sweats. Psych:  No mood changes, depression or anxiety. Pain:  No focal pain. Review of systems:  All other systems reviewed and found to be negative.  Physical Exam: Blood pressure (!) 135/91, pulse 68, temperature (!) 96.1 F (35.6 C), resp. rate 20, weight 226 lb 10.1 oz (102.8 kg), SpO2 100 %. GENERAL:  Well developed, well nourished, gentleman sitting comfortably in the exam room in no acute distress. MENTAL STATUS:  Alert and oriented to person, place and time. HEAD:  Thin dark blonde hair; male pattern baldness.  Normocephalic, atraumatic, face symmetric, no Cushingoid features. EYES:  Glasses.  Dark blue eyes.  No conjunctivitis or scleral icterus. ENT:  Oropharynx clear without lesion.  Tongue normal.  Reconstructed uvula.  Missing lower tooth.  Mucous membranes moist.  RESPIRATORY:  Clear to auscultation without rales, wheezes or rhonchi. CARDIOVASCULAR:  Regular rate and rhythm without murmur, rub or gallop. SKIN:  Fair complexion.  No rashes, ulcers or lesions. EXTREMITIES:  Chronic lower extremity edema.  No skin discoloration or tenderness.  No palpable cords. NEUROLOGICAL: Unremarkable. PSYCH:  Appropriate.   Infusion on 11/19/2017  Component Date Value Ref Range Status  . WBC 11/19/2017 4.2  3.8 - 10.6 K/uL Final  . RBC 11/19/2017 3.44* 4.40 - 5.90 MIL/uL Final  . Hemoglobin 11/19/2017 11.7* 13.0 - 18.0 g/dL Final  . HCT 11/19/2017  34.2* 40.0 - 52.0 % Final  . MCV 11/19/2017 99.3  80.0 - 100.0 fL Final  . MCH 11/19/2017 34.0  26.0 - 34.0 pg Final  . MCHC 11/19/2017 34.2  32.0 - 36.0 g/dL Final  . RDW 11/19/2017 15.8* 11.5 - 14.5 % Final  . Platelets 11/19/2017 102* 150 - 440 K/uL Final  . Neutrophils Relative % 11/19/2017 53  % Final  . Neutro Abs 11/19/2017 2.3  1.4 - 6.5 K/uL Final  . Lymphocytes Relative 11/19/2017 33  % Final  . Lymphs Abs 11/19/2017 1.4  1.0 - 3.6 K/uL Final  . Monocytes Relative 11/19/2017 9  % Final  . Monocytes Absolute 11/19/2017 0.4  0.2 - 1.0 K/uL Final  . Eosinophils Relative 11/19/2017 4  % Final  . Eosinophils Absolute 11/19/2017 0.2  0 - 0.7 K/uL Final  . Basophils Relative 11/19/2017 1  % Final  . Basophils Absolute 11/19/2017 0.0  0 - 0.1 K/uL Final   Performed at [facility], [address] [place], [place] [address]    Assessment:  CATLIN AYCOCK is a 57 y.o. male with hemochromatosis.  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  He presented with an elevated iron saturation (47%) and ferritin.  His mother has hemochromatosis.  Work-up on 07/30/2017 revealed two mutations (C282Y and H63D).  Iron saturation was 46% with a TIBC of 202.  Ferritin was 1143.  CRP was < 0.8.  Sed rate was 31 (0-20).  B12 was 448.  Folate was 13.1.  CBC revealed a hematocrit of 34.1, hemoglobin 11.3, MCV 99.7, platelets 107,000, WBC 4100 with an ANC of 2300.  Ferritin has been followed: 680 on 06/18/2017, 1143 on 07/30/2017, 636 on 09/17/2017, and 537 on 11/19/2017.  Ferritin goal is < 50.  He has renal failure and has been on dialysis x 2 years.  He has associated anemia of chronic renal disease.  He receives Procrit.  RUQ ultrasound on 09/22/2017 a normal hepatic echotexture.  There was no mass.  AFP was 1.2 on 09/17/2017.  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  Colonoscopy on 09/08/2017 at [facility] revealed an 8 mm polyp in the  ascending colon and 3 mm polyp at the hepatic flexure, removed with a cold snare.  There were internal hemorrhoids. Pathology revealed sessile serrated adenomas that were negative for conventional dysplasia  He is handicapped and is on disability.  Symptomatically, he denies any complaint.  Patient denies any acute concerns today. Blood pressure is stable. Exam is stable. Hemoglobin is 11.7, hematocrit 34.2, MCV 99.3, and platelets 102,000.  Ferritin is 537.  Plan: 1.  Labs today:  CBC, ferritin. 2.  Discuss plan for small volume (150 cc) phlebotomy every 2 weeks as long as HCT > 33 and Hgb > 11.  Goal ferritin < 50. 3.  Discuss limiting iron rich food intake. Market researcher provided.  4.  Phlebotomy today. 5.  RTC every 2 weeks for labs (CBC).  Check ferritin monthly.  Phlebotomy every 2 weeks if needed. 6.  RTC in 3 months for MD assessment, labs (CBC with diff, ferritin - day before), and +/- phlebotomy.   Honor Loh, NP  11/19/2017, 3:37 PM   I saw and evaluated the patient, participating in the key portions of the service and reviewing pertinent diagnostic studies and records.  I reviewed the nurse practitioner's note and agree with the findings and the plan.  The assessment and plan were discussed with the patient. Several questions were asked by the patient and his mother and answered.   Nolon Stalls, MD 11/19/2017,3:37 PM

## 2017-11-19 NOTE — Patient Instructions (Signed)

## 2017-12-03 ENCOUNTER — Inpatient Hospital Stay: Payer: Medicare Other

## 2017-12-03 ENCOUNTER — Inpatient Hospital Stay: Payer: Medicare Other | Attending: Hematology and Oncology

## 2017-12-03 DIAGNOSIS — Z95828 Presence of other vascular implants and grafts: Secondary | ICD-10-CM

## 2017-12-03 DIAGNOSIS — Z452 Encounter for adjustment and management of vascular access device: Secondary | ICD-10-CM | POA: Diagnosis present

## 2017-12-03 LAB — CBC
HCT: 32.5 % — ABNORMAL LOW (ref 40.0–52.0)
Hemoglobin: 11 g/dL — ABNORMAL LOW (ref 13.0–18.0)
MCH: 33.2 pg (ref 26.0–34.0)
MCHC: 33.8 g/dL (ref 32.0–36.0)
MCV: 98.2 fL (ref 80.0–100.0)
PLATELETS: 82 10*3/uL — AB (ref 150–440)
RBC: 3.31 MIL/uL — ABNORMAL LOW (ref 4.40–5.90)
RDW: 15.2 % — AB (ref 11.5–14.5)
WBC: 3.7 10*3/uL — ABNORMAL LOW (ref 3.8–10.6)

## 2017-12-03 MED ORDER — SODIUM CHLORIDE 0.9% FLUSH
10.0000 mL | Freq: Once | INTRAVENOUS | Status: AC
  Administered 2017-12-03: 10 mL via INTRAVENOUS
  Filled 2017-12-03: qty 10

## 2017-12-03 MED ORDER — HEPARIN SOD (PORK) LOCK FLUSH 100 UNIT/ML IV SOLN
INTRAVENOUS | Status: AC
  Filled 2017-12-03: qty 5

## 2017-12-03 MED ORDER — HEPARIN SOD (PORK) LOCK FLUSH 100 UNIT/ML IV SOLN
500.0000 [IU] | Freq: Once | INTRAVENOUS | Status: AC
  Administered 2017-12-03: 500 [IU] via INTRAVENOUS

## 2017-12-16 ENCOUNTER — Other Ambulatory Visit: Payer: Self-pay | Admitting: *Deleted

## 2017-12-16 DIAGNOSIS — D539 Nutritional anemia, unspecified: Secondary | ICD-10-CM

## 2017-12-17 ENCOUNTER — Inpatient Hospital Stay: Payer: Medicare Other

## 2017-12-17 DIAGNOSIS — D539 Nutritional anemia, unspecified: Secondary | ICD-10-CM

## 2017-12-17 LAB — FERRITIN: Ferritin: 640 ng/mL — ABNORMAL HIGH (ref 24–336)

## 2017-12-17 LAB — CBC WITH DIFFERENTIAL/PLATELET
Basophils Absolute: 0 10*3/uL (ref 0–0.1)
Basophils Relative: 1 %
Eosinophils Absolute: 0.1 10*3/uL (ref 0–0.7)
Eosinophils Relative: 3 %
HCT: 34.1 % — ABNORMAL LOW (ref 40.0–52.0)
Hemoglobin: 11.5 g/dL — ABNORMAL LOW (ref 13.0–18.0)
Lymphocytes Relative: 34 %
Lymphs Abs: 1.5 10*3/uL (ref 1.0–3.6)
MCH: 32.8 pg (ref 26.0–34.0)
MCHC: 33.9 g/dL (ref 32.0–36.0)
MCV: 96.7 fL (ref 80.0–100.0)
Monocytes Absolute: 0.4 10*3/uL (ref 0.2–1.0)
Monocytes Relative: 8 %
Neutro Abs: 2.5 10*3/uL (ref 1.4–6.5)
Neutrophils Relative %: 54 %
Platelets: 112 10*3/uL — ABNORMAL LOW (ref 150–440)
RBC: 3.52 MIL/uL — ABNORMAL LOW (ref 4.40–5.90)
RDW: 15.6 % — ABNORMAL HIGH (ref 11.5–14.5)
WBC: 4.5 10*3/uL (ref 3.8–10.6)

## 2017-12-17 MED ORDER — HEPARIN SOD (PORK) LOCK FLUSH 100 UNIT/ML IV SOLN
500.0000 [IU] | Freq: Once | INTRAVENOUS | Status: AC
  Administered 2017-12-17: 500 [IU] via INTRAVENOUS

## 2017-12-17 MED ORDER — SODIUM CHLORIDE 0.9% FLUSH
10.0000 mL | INTRAVENOUS | Status: DC | PRN
  Administered 2017-12-17: 10 mL via INTRAVENOUS
  Filled 2017-12-17: qty 10

## 2017-12-17 NOTE — Patient Instructions (Signed)

## 2017-12-26 ENCOUNTER — Other Ambulatory Visit: Payer: Self-pay | Admitting: *Deleted

## 2017-12-26 DIAGNOSIS — D539 Nutritional anemia, unspecified: Secondary | ICD-10-CM

## 2017-12-31 ENCOUNTER — Inpatient Hospital Stay: Payer: Medicare Other | Attending: Hematology and Oncology

## 2017-12-31 ENCOUNTER — Inpatient Hospital Stay: Payer: Medicare Other

## 2017-12-31 VITALS — BP 118/76 | HR 75 | Temp 96.9°F | Resp 18

## 2017-12-31 DIAGNOSIS — D539 Nutritional anemia, unspecified: Secondary | ICD-10-CM

## 2017-12-31 LAB — CBC WITH DIFFERENTIAL/PLATELET
Basophils Absolute: 0 10*3/uL (ref 0–0.1)
Basophils Relative: 1 %
Eosinophils Absolute: 0.2 10*3/uL (ref 0–0.7)
Eosinophils Relative: 4 %
HCT: 34 % — ABNORMAL LOW (ref 40.0–52.0)
Hemoglobin: 11.4 g/dL — ABNORMAL LOW (ref 13.0–18.0)
Lymphocytes Relative: 39 %
Lymphs Abs: 1.4 10*3/uL (ref 1.0–3.6)
MCH: 32.7 pg (ref 26.0–34.0)
MCHC: 33.5 g/dL (ref 32.0–36.0)
MCV: 97.7 fL (ref 80.0–100.0)
Monocytes Absolute: 0.5 10*3/uL (ref 0.2–1.0)
Monocytes Relative: 12 %
Neutro Abs: 1.6 10*3/uL (ref 1.4–6.5)
Neutrophils Relative %: 44 %
Platelets: 95 10*3/uL — ABNORMAL LOW (ref 150–440)
RBC: 3.48 MIL/uL — ABNORMAL LOW (ref 4.40–5.90)
RDW: 15.9 % — ABNORMAL HIGH (ref 11.5–14.5)
WBC: 3.7 10*3/uL — ABNORMAL LOW (ref 3.8–10.6)

## 2017-12-31 MED ORDER — HEPARIN SOD (PORK) LOCK FLUSH 100 UNIT/ML IV SOLN
500.0000 [IU] | Freq: Once | INTRAVENOUS | Status: AC
  Administered 2017-12-31: 500 [IU] via INTRAVENOUS

## 2017-12-31 MED ORDER — SODIUM CHLORIDE 0.9% FLUSH
10.0000 mL | INTRAVENOUS | Status: DC | PRN
  Administered 2017-12-31: 10 mL via INTRAVENOUS
  Filled 2017-12-31: qty 10

## 2017-12-31 NOTE — Patient Instructions (Signed)

## 2018-01-14 ENCOUNTER — Inpatient Hospital Stay: Payer: Medicare Other

## 2018-01-14 ENCOUNTER — Ambulatory Visit: Payer: Self-pay

## 2018-01-14 VITALS — BP 100/63 | HR 74 | Resp 18

## 2018-01-14 DIAGNOSIS — D539 Nutritional anemia, unspecified: Secondary | ICD-10-CM

## 2018-01-14 LAB — CBC WITH DIFFERENTIAL/PLATELET
Basophils Absolute: 0 10*3/uL (ref 0–0.1)
Basophils Relative: 1 %
Eosinophils Absolute: 0.1 10*3/uL (ref 0–0.7)
Eosinophils Relative: 3 %
HCT: 33.8 % — ABNORMAL LOW (ref 40.0–52.0)
Hemoglobin: 11.3 g/dL — ABNORMAL LOW (ref 13.0–18.0)
Lymphocytes Relative: 27 %
Lymphs Abs: 1 10*3/uL (ref 1.0–3.6)
MCH: 32.7 pg (ref 26.0–34.0)
MCHC: 33.5 g/dL (ref 32.0–36.0)
MCV: 97.4 fL (ref 80.0–100.0)
Monocytes Absolute: 0.3 10*3/uL (ref 0.2–1.0)
Monocytes Relative: 8 %
Neutro Abs: 2.2 10*3/uL (ref 1.4–6.5)
Neutrophils Relative %: 61 %
Platelets: 98 10*3/uL — ABNORMAL LOW (ref 150–440)
RBC: 3.47 MIL/uL — ABNORMAL LOW (ref 4.40–5.90)
RDW: 15.5 % — ABNORMAL HIGH (ref 11.5–14.5)
WBC: 3.7 10*3/uL — ABNORMAL LOW (ref 3.8–10.6)

## 2018-01-14 MED ORDER — SODIUM CHLORIDE 0.9% FLUSH
10.0000 mL | INTRAVENOUS | Status: DC | PRN
  Administered 2018-01-14: 10 mL via INTRAVENOUS
  Filled 2018-01-14: qty 10

## 2018-01-14 MED ORDER — HEPARIN SOD (PORK) LOCK FLUSH 100 UNIT/ML IV SOLN
500.0000 [IU] | Freq: Once | INTRAVENOUS | Status: AC
  Administered 2018-01-14: 500 [IU] via INTRAVENOUS

## 2018-01-14 NOTE — Patient Instructions (Signed)

## 2018-01-28 ENCOUNTER — Inpatient Hospital Stay: Payer: Medicare Other

## 2018-01-28 DIAGNOSIS — D539 Nutritional anemia, unspecified: Secondary | ICD-10-CM

## 2018-01-28 LAB — CBC WITH DIFFERENTIAL/PLATELET
Basophils Absolute: 0 10*3/uL (ref 0–0.1)
Basophils Relative: 1 %
Eosinophils Absolute: 0.2 10*3/uL (ref 0–0.7)
Eosinophils Relative: 5 %
HCT: 34.8 % — ABNORMAL LOW (ref 40.0–52.0)
Hemoglobin: 11.8 g/dL — ABNORMAL LOW (ref 13.0–18.0)
Lymphocytes Relative: 34 %
Lymphs Abs: 1.3 10*3/uL (ref 1.0–3.6)
MCH: 32.8 pg (ref 26.0–34.0)
MCHC: 33.8 g/dL (ref 32.0–36.0)
MCV: 97 fL (ref 80.0–100.0)
Monocytes Absolute: 0.3 10*3/uL (ref 0.2–1.0)
Monocytes Relative: 9 %
Neutro Abs: 2 10*3/uL (ref 1.4–6.5)
Neutrophils Relative %: 51 %
Platelets: 113 10*3/uL — ABNORMAL LOW (ref 150–440)
RBC: 3.59 MIL/uL — ABNORMAL LOW (ref 4.40–5.90)
RDW: 15.9 % — ABNORMAL HIGH (ref 11.5–14.5)
WBC: 3.8 10*3/uL (ref 3.8–10.6)

## 2018-01-28 NOTE — Patient Instructions (Signed)

## 2018-01-29 LAB — FERRITIN: Ferritin: 531 ng/mL — ABNORMAL HIGH (ref 24–336)

## 2018-02-10 ENCOUNTER — Inpatient Hospital Stay: Payer: Medicare Other

## 2018-02-11 ENCOUNTER — Other Ambulatory Visit: Payer: Self-pay | Admitting: Hematology and Oncology

## 2018-02-11 ENCOUNTER — Encounter: Payer: Self-pay | Admitting: Hematology and Oncology

## 2018-02-11 ENCOUNTER — Ambulatory Visit: Payer: Self-pay

## 2018-02-11 ENCOUNTER — Inpatient Hospital Stay: Payer: Medicare Other | Attending: Hematology and Oncology | Admitting: Hematology and Oncology

## 2018-02-11 ENCOUNTER — Inpatient Hospital Stay: Payer: Medicare Other

## 2018-02-11 ENCOUNTER — Other Ambulatory Visit: Payer: Self-pay

## 2018-02-11 DIAGNOSIS — D631 Anemia in chronic kidney disease: Secondary | ICD-10-CM | POA: Diagnosis not present

## 2018-02-11 DIAGNOSIS — Z8249 Family history of ischemic heart disease and other diseases of the circulatory system: Secondary | ICD-10-CM | POA: Diagnosis not present

## 2018-02-11 DIAGNOSIS — Z79899 Other long term (current) drug therapy: Secondary | ICD-10-CM | POA: Insufficient documentation

## 2018-02-11 DIAGNOSIS — Z7982 Long term (current) use of aspirin: Secondary | ICD-10-CM | POA: Diagnosis not present

## 2018-02-11 DIAGNOSIS — D539 Nutritional anemia, unspecified: Secondary | ICD-10-CM

## 2018-02-11 DIAGNOSIS — Z992 Dependence on renal dialysis: Secondary | ICD-10-CM | POA: Diagnosis not present

## 2018-02-11 DIAGNOSIS — N186 End stage renal disease: Secondary | ICD-10-CM | POA: Diagnosis not present

## 2018-02-11 DIAGNOSIS — Z452 Encounter for adjustment and management of vascular access device: Secondary | ICD-10-CM | POA: Insufficient documentation

## 2018-02-11 LAB — COMPREHENSIVE METABOLIC PANEL
ALT: 15 U/L — ABNORMAL LOW (ref 17–63)
AST: 18 U/L (ref 15–41)
Albumin: 3.8 g/dL (ref 3.5–5.0)
Alkaline Phosphatase: 76 U/L (ref 38–126)
Anion gap: 13 (ref 5–15)
BUN: 73 mg/dL — ABNORMAL HIGH (ref 6–20)
CO2: 22 mmol/L (ref 22–32)
Calcium: 9.1 mg/dL (ref 8.9–10.3)
Chloride: 103 mmol/L (ref 101–111)
Creatinine, Ser: 9.09 mg/dL — ABNORMAL HIGH (ref 0.61–1.24)
GFR calc Af Amer: 7 mL/min — ABNORMAL LOW (ref >60)
GFR calc non Af Amer: 6 mL/min — ABNORMAL LOW (ref >60)
Glucose, Bld: 129 mg/dL — ABNORMAL HIGH (ref 65–99)
Potassium: 5.2 mmol/L — ABNORMAL HIGH (ref 3.5–5.1)
Sodium: 138 mmol/L (ref 135–145)
Total Bilirubin: 0.4 mg/dL (ref 0.3–1.2)
Total Protein: 6.7 g/dL (ref 6.5–8.1)

## 2018-02-11 LAB — CBC WITH DIFFERENTIAL/PLATELET
Basophils Absolute: 0 10*3/uL (ref 0–0.1)
Basophils Relative: 1 %
Eosinophils Absolute: 0.2 10*3/uL (ref 0–0.7)
Eosinophils Relative: 5 %
HCT: 31.2 % — ABNORMAL LOW (ref 40.0–52.0)
Hemoglobin: 10.5 g/dL — ABNORMAL LOW (ref 13.0–18.0)
Lymphocytes Relative: 30 %
Lymphs Abs: 1.2 10*3/uL (ref 1.0–3.6)
MCH: 32.4 pg (ref 26.0–34.0)
MCHC: 33.6 g/dL (ref 32.0–36.0)
MCV: 96.6 fL (ref 80.0–100.0)
Monocytes Absolute: 0.3 10*3/uL (ref 0.2–1.0)
Monocytes Relative: 7 %
Neutro Abs: 2.3 10*3/uL (ref 1.4–6.5)
Neutrophils Relative %: 57 %
Platelets: 104 10*3/uL — ABNORMAL LOW (ref 150–440)
RBC: 3.23 MIL/uL — ABNORMAL LOW (ref 4.40–5.90)
RDW: 15.5 % — ABNORMAL HIGH (ref 11.5–14.5)
WBC: 4 10*3/uL (ref 3.8–10.6)

## 2018-02-11 LAB — FERRITIN: Ferritin: 450 ng/mL — ABNORMAL HIGH (ref 24–336)

## 2018-02-11 MED ORDER — SODIUM CHLORIDE 0.9% FLUSH
10.0000 mL | INTRAVENOUS | Status: DC | PRN
  Administered 2018-02-11: 10 mL via INTRAVENOUS
  Filled 2018-02-11: qty 10

## 2018-02-11 MED ORDER — HEPARIN SOD (PORK) LOCK FLUSH 100 UNIT/ML IV SOLN
500.0000 [IU] | Freq: Once | INTRAVENOUS | Status: AC
  Administered 2018-02-11: 500 [IU] via INTRAVENOUS

## 2018-02-11 NOTE — Progress Notes (Signed)
[facility] day:  02/11/2018  Chief Complaint: Ronnie Keith is a 57 y.o. male with end stage renal disease on dialysis, hemochromatosis, and anemia of chronic renal disease who is seen for 3 month assessment  HPI: The patient was last seen in the hematology clinic on 11/19/2017.   At that time, he denied any complaint.  Blood pressure was stable. Exam was stable. Hemoglobin was 11.7, hematocrit 34.2, MCV 99.3, and platelets 102,000.  Ferritin was 537.  He underwent phlebotomy on 11/19/2017, 12/03/2017, 12/17/2017, 12/31/2017, 01/14/2018, and 01/28/2018.  CBC on 01/28/2018 revealed a hematocrit of 34.8, hemoglobin 11.8, MCV 97, platelets 113,000, WBC 3800 with an ANC of 2000.  During the interim, patient is doing well today, and does not express any acute concerns. Patient denies B symptoms. He has not experienced any significant interval infections. Patient notes that he is eating well. Weight has increased by 5  pounds.   Patient denies pain in the clinic today.    Past Medical History:  Diagnosis Date  . Anxiety   . Chronic kidney disease   . Chronic low back pain   . Hypertension   . Sciatica   . Scoliosis     Past Surgical History:  Procedure Laterality Date  . CLEFT PALATE REPAIR    . EYE SURGERY     "when I was small"  . PERIPHERAL VASCULAR CATHETERIZATION N/A 09/29/2015   Procedure: Dialysis/Perma Catheter Insertion;  Surgeon: Katha Cabal, MD;  Location: [facility];  Service: Cardiovascular;  Laterality: N/A;  . ROTATOR CUFF REPAIR Left     Family History  Problem Relation Age of Onset  . Hypertension Other     Social History:  reports that he has never smoked. He has never used smokeless tobacco. He reports that he does not drink alcohol or use drugs.  Patient is on disability. There have been no known exposures to radiation or toxins.  He lives in [place] with his sister and mother [name]).  He likes to bowl  and go "people watching".  His sister has cerebral palsy. The patient is accompanied by his mother today.  Allergies:  Allergies  Allergen Reactions  . Vancomycin Rash    Current Medications: Current Outpatient Medications  Medication Sig Dispense Refill  . acetaminophen (TYLENOL) 325 MG tablet Take 650 mg by mouth every 6 (six) hours as needed for mild pain or moderate pain.    Marland Kitchen aspirin EC 81 MG tablet Take 81 mg by mouth daily.    Marland Kitchen buPROPion (WELLBUTRIN XL) 150 MG 24 hr tablet Take 1 tablet (150 mg total) by mouth daily. 30 tablet 1  . calcium acetate (PHOSLO) 667 MG capsule Take 2 capsules (1,334 mg total) by mouth 3 (three) times daily with meals. 180 capsule 1  . fluticasone (FLONASE) 50 MCG/ACT nasal spray Place 1 spray into both nostrils daily.    Marland Kitchen MELATONIN PO Take 1 tablet by mouth daily.    . metoprolol succinate (TOPROL-XL) 50 MG 24 hr tablet Take 50 mg by mouth daily.    Marland Kitchen omeprazole (PRILOSEC) 40 MG capsule Take 40 mg by mouth daily.    . QUEtiapine (SEROQUEL) 200 MG tablet Take 1 tablet (200 mg total) by mouth at bedtime. 30 tablet 1  . rOPINIRole (REQUIP) 2 MG tablet Take 1 tablet (2 mg total) by mouth at bedtime. 30 tablet 1  . senna-docusate (SENOKOT-S) 8.6-50 MG tablet Take 2 tablets by mouth 2 (  two) times daily. 60 tablet 2  . traMADol (ULTRAM) 50 MG tablet Take 1 tablet (50 mg total) by mouth every 6 (six) hours as needed for severe pain. 30 tablet 0   No current facility-administered medications for this visit.    Facility-Administered Medications Ordered in Other Visits  Medication Dose Route Frequency Provider Last Rate Last Dose  . heparin lock flush 100 unit/mL  500 Units Intravenous Once Corcoran, Melissa C, MD      . sodium chloride flush (NS) 0.9 % injection 10 mL  10 mL Intravenous PRN Mike Gip, Melissa C, MD   10 mL at 09/17/17 1000  . sodium chloride flush (NS) 0.9 % injection 10 mL  10 mL Intravenous PRN Nolon Stalls C, MD   10 mL at 02/11/18 1450     Review of Systems  Constitutional: Negative for diaphoresis, fever, malaise/fatigue and weight loss (up 5 pounds).       "I feel alright".  Little tired.  HENT: Negative.   Eyes: Negative.   Respiratory: Positive for shortness of breath (exertional). Negative for cough, hemoptysis and sputum production.   Cardiovascular: Negative for chest pain, palpitations, orthopnea, leg swelling and PND.  Gastrointestinal: Positive for heartburn (on PPI). Negative for abdominal pain, blood in stool, constipation, diarrhea, melena, nausea and vomiting.  Genitourinary: Negative for dysuria, frequency, hematuria and urgency.  Musculoskeletal: Negative for back pain, falls, joint pain and myalgias.  Skin: Negative for itching and rash.  Neurological: Negative for dizziness, tremors, weakness and headaches.  Endo/Heme/Allergies: Does not bruise/bleed easily.  Psychiatric/Behavioral: Negative for depression, memory loss and suicidal ideas. The patient is not nervous/anxious and does not have insomnia.   All other systems reviewed and are negative.  Performance status (ECOG): 2 - Symptomatic, <50% confined to bed  Physical Exam: Blood pressure (!) 148/84, pulse 81, temperature 97.9 F (36.6 C), temperature source Tympanic, resp. rate 18, weight 231 lb 7.7 oz (105 kg). GENERAL:  Well developed, well nourished, gentleman sitting comfortably in the exam room in no acute distress. MENTAL STATUS:  Alert and oriented to person, place and time. HEAD:  Thin dark blonde hair.  Male pattern baldness.  Normocephalic, atraumatic, face symmetric, no Cushingoid features. EYES:  Glasses.  Dark blue eyes.  Pupils equal round and reactive to light and accomodation.  No conjunctivitis or scleral icterus. ENT:  Oropharynx clear without lesion.  Tongue normal.  Missing lower tooth.  Mucous membranes moist.  RESPIRATORY:  Clear to auscultation without rales, wheezes or rhonchi. CARDIOVASCULAR:  Regular rate and rhythm  without murmur, rub or gallop. ABDOMEN:  Soft, non-tender, with active bowel sounds, and no hepatosplenomegaly.  No masses. SKIN:  Fair.  No rashes, ulcers or lesions. EXTREMITIES:  Left AV fistula with antecubital thrill.  Chronic lower extremity edema.  No skin discoloration or tenderness.  No palpable cords. LYMPH NODES: No palpable cervical, supraclavicular, axillary or inguinal adenopathy  NEUROLOGICAL: Unremarkable. PSYCH:  Appropriate.   Infusion on 02/11/2018  Component Date Value Ref Range Status  . Sodium 02/11/2018 138  135 - 145 mmol/L Final  . Potassium 02/11/2018 5.2* 3.5 - 5.1 mmol/L Final  . Chloride 02/11/2018 103  101 - 111 mmol/L Final  . CO2 02/11/2018 22  22 - 32 mmol/L Final  . Glucose, Bld 02/11/2018 129* 65 - 99 mg/dL Final  . BUN 02/11/2018 73* 6 - 20 mg/dL Final  . Creatinine, Ser 02/11/2018 9.09* 0.61 - 1.24 mg/dL Final  . Calcium 02/11/2018 9.1  8.9 - 10.3 mg/dL  Final  . Total Protein 02/11/2018 6.7  6.5 - 8.1 g/dL Final  . Albumin 02/11/2018 3.8  3.5 - 5.0 g/dL Final  . AST 02/11/2018 18  15 - 41 U/L Final  . ALT 02/11/2018 15* 17 - 63 U/L Final  . Alkaline Phosphatase 02/11/2018 76  38 - 126 U/L Final  . Total Bilirubin 02/11/2018 0.4  0.3 - 1.2 mg/dL Final  . GFR calc non Af Amer 02/11/2018 6* >60 mL/min Final  . GFR calc Af Amer 02/11/2018 7* >60 mL/min Final   Comment: (NOTE) The eGFR has been calculated using the CKD EPI equation. This calculation has not been validated in all clinical situations. eGFR's persistently <60 mL/min signify possible Chronic Kidney Disease.   Georgiann Hahn gap 02/11/2018 13  5 - 15 Final   Performed at [facility], [address], [place], [place] [address]  . WBC 02/11/2018 4.0  3.8 - 10.6 K/uL Final  . RBC 02/11/2018 3.23* 4.40 - 5.90 MIL/uL Final  . Hemoglobin 02/11/2018 10.5* 13.0 - 18.0 g/dL Final  . HCT 02/11/2018 31.2* 40.0 - 52.0 % Final  . MCV 02/11/2018 96.6  80.0 - 100.0 fL Final  . MCH 02/11/2018  32.4  26.0 - 34.0 pg Final  . MCHC 02/11/2018 33.6  32.0 - 36.0 g/dL Final  . RDW 02/11/2018 15.5* 11.5 - 14.5 % Final  . Platelets 02/11/2018 104* 150 - 440 K/uL Final  . Neutrophils Relative % 02/11/2018 57  % Final  . Neutro Abs 02/11/2018 2.3  1.4 - 6.5 K/uL Final  . Lymphocytes Relative 02/11/2018 30  % Final  . Lymphs Abs 02/11/2018 1.2  1.0 - 3.6 K/uL Final  . Monocytes Relative 02/11/2018 7  % Final  . Monocytes Absolute 02/11/2018 0.3  0.2 - 1.0 K/uL Final  . Eosinophils Relative 02/11/2018 5  % Final  . Eosinophils Absolute 02/11/2018 0.2  0 - 0.7 K/uL Final  . Basophils Relative 02/11/2018 1  % Final  . Basophils Absolute 02/11/2018 0.0  0 - 0.1 K/uL Final   Performed at [facility], [address] [place], [place] [address]    Assessment:  SHEPPARD LUCKENBACH is a 57 y.o. male with hemochromatosis.  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  He presented with an elevated iron saturation (47%) and ferritin.  His mother has hemochromatosis.  Work-up on 07/30/2017 revealed two mutations (C282Y and H63D).  Iron saturation was 46% with a TIBC of 202.  Ferritin was 1143.  CRP was < 0.8.  Sed rate was 31 (0-20).  B12 was 448.  Folate was 13.1.  CBC revealed a hematocrit of 34.1, hemoglobin 11.3, MCV 99.7, platelets 107,000, WBC 4100 with an ANC of 2300.  Ferritin has been followed: 680 on 06/18/2017, 1143 on 07/30/2017, 636 on 09/17/2017, 537 on 11/19/2017, 640 on 12/17/2017, 531 on 01/28/2018, and 450 on 02/11/2018.  Ferritin goal is < 50.  He has renal failure and has been on dialysis x 2 years.  He has associated anemia of chronic renal disease.  He receives Procrit.  RUQ ultrasound on 09/22/2017 a normal hepatic echotexture.  There was no mass.  AFP was 1.2 on 09/17/2017.  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  Colonoscopy on 09/08/2017 at [facility] revealed an 8 mm polyp in the ascending colon and 3 mm polyp at the  hepatic flexure, removed with a cold snare.  There were internal  hemorrhoids. Pathology revealed sessile serrated adenomas that were negative for conventional dysplasia  He is handicapped and is on disability.  Symptomatically, he denies any complaint.  Patient denies any acute concerns today. Blood pressure is stable. Exam is stable. Hemoglobin is 10.5, hematocrit 31.2, MCV 96.6, and platelets 104,000.  Potassium 5.2 (elevated). BUN 73 and creatinine 9.09 (CrCl 11.3 mL/min). Ferritin is 450.   Plan: 1. Labs today:  CBC, CMP, ferritin, AFP, folate, B12, TSH 2. Discuss hemochromatosis. Hematocrit 31.2. Hemoglobin 11.2. Discuss plan for small volume (150 cc) phlebotomy every 2 weeks as long as HCT > 33 and Hgb > 11.  Goal ferritin < 50. No phlebotomy today. 3. Schedule RUQ ultrasound 03/22/2018. 4. Discuss limiting iron rich food intake.  5.   RTC every 2 weeks for labs (CBC).  Check ferritin monthly.  Phlebotomy every 2 weeks if needed. 6.   RTC in 3 months for MD assessment, labs (CBC with diff, ferritin - day before), and +/- phlebotomy.  Addendum:  Spoke with Dr. Holley Raring after patient's appointment.  Plan for small volume phlebotomy with dialysis every 1-2 weeks (tubing for dialysis will not be flushed, but blood discarded).   Honor Loh, NP  02/11/2018, 3:29 PM   I saw and evaluated the patient, participating in the key portions of the service and reviewing pertinent diagnostic studies and records.  I reviewed the nurse practitioner's note and agree with the findings and the plan.  The assessment and plan were discussed with the patient. Several questions were asked by the patient and his mother and answered.   Nolon Stalls, MD 02/11/2018,3:29 PM

## 2018-02-12 LAB — AFP TUMOR MARKER: AFP, Serum, Tumor Marker: 0.8 ng/mL (ref 0.0–8.3)

## 2018-02-12 LAB — TSH: TSH: 1.85 u[IU]/mL (ref 0.350–4.500)

## 2018-02-12 LAB — FOLATE: FOLATE: 14.6 ng/mL (ref >5.9)

## 2018-02-12 LAB — VITAMIN B12: VITAMIN B 12: 438 pg/mL (ref 180–914)

## 2018-02-25 ENCOUNTER — Inpatient Hospital Stay: Payer: Medicare Other

## 2018-02-25 DIAGNOSIS — Z452 Encounter for adjustment and management of vascular access device: Secondary | ICD-10-CM | POA: Diagnosis not present

## 2018-02-25 DIAGNOSIS — D539 Nutritional anemia, unspecified: Secondary | ICD-10-CM

## 2018-02-25 LAB — CBC WITH DIFFERENTIAL/PLATELET
Basophils Absolute: 0 10*3/uL (ref 0–0.1)
Basophils Relative: 1 %
Eosinophils Absolute: 0.3 10*3/uL (ref 0–0.7)
Eosinophils Relative: 6 %
HCT: 32.2 % — ABNORMAL LOW (ref 40.0–52.0)
Hemoglobin: 10.9 g/dL — ABNORMAL LOW (ref 13.0–18.0)
Lymphocytes Relative: 27 %
Lymphs Abs: 1.3 10*3/uL (ref 1.0–3.6)
MCH: 32.9 pg (ref 26.0–34.0)
MCHC: 34 g/dL (ref 32.0–36.0)
MCV: 96.8 fL (ref 80.0–100.0)
Monocytes Absolute: 0.4 10*3/uL (ref 0.2–1.0)
Monocytes Relative: 8 %
Neutro Abs: 2.7 10*3/uL (ref 1.4–6.5)
Neutrophils Relative %: 58 %
Platelets: 107 10*3/uL — ABNORMAL LOW (ref 150–440)
RBC: 3.32 MIL/uL — ABNORMAL LOW (ref 4.40–5.90)
RDW: 15.5 % — ABNORMAL HIGH (ref 11.5–14.5)
WBC: 4.7 10*3/uL (ref 3.8–10.6)

## 2018-02-25 MED ORDER — SODIUM CHLORIDE 0.9% FLUSH
10.0000 mL | INTRAVENOUS | Status: DC | PRN
  Administered 2018-02-25: 10 mL via INTRAVENOUS
  Filled 2018-02-25: qty 10

## 2018-02-25 MED ORDER — HEPARIN SOD (PORK) LOCK FLUSH 100 UNIT/ML IV SOLN
500.0000 [IU] | Freq: Once | INTRAVENOUS | Status: AC
  Administered 2018-02-25: 500 [IU] via INTRAVENOUS

## 2018-02-25 NOTE — Progress Notes (Signed)
Patient here today.  Hemoglobin is 10.9 today so no phlebotomy needed.  Patient expressed that when he went to [facility] for Dr. Alice Reichert that the procedure did not take place due to emergencies that came in the door. NP made aware that the shunt procedure did not happen.

## 2018-02-26 LAB — FERRITIN: Ferritin: 416 ng/mL — ABNORMAL HIGH (ref 24–336)

## 2018-03-11 ENCOUNTER — Inpatient Hospital Stay: Payer: Medicare Other | Attending: Hematology and Oncology

## 2018-03-11 ENCOUNTER — Inpatient Hospital Stay: Payer: Medicare Other

## 2018-03-23 ENCOUNTER — Ambulatory Visit: Payer: Medicare Other

## 2018-03-25 ENCOUNTER — Inpatient Hospital Stay: Payer: Medicare Other

## 2018-03-26 ENCOUNTER — Other Ambulatory Visit: Payer: Self-pay

## 2018-03-26 ENCOUNTER — Emergency Department: Payer: Medicare Other

## 2018-03-26 ENCOUNTER — Encounter: Payer: Self-pay | Admitting: Emergency Medicine

## 2018-03-26 ENCOUNTER — Emergency Department
Admission: EM | Admit: 2018-03-26 | Discharge: 2018-03-26 | Disposition: A | Discharge status: Home / Self Care | Payer: Medicare Other | Attending: Emergency Medicine | Admitting: Emergency Medicine

## 2018-03-26 DIAGNOSIS — Z7982 Long term (current) use of aspirin: Secondary | ICD-10-CM | POA: Insufficient documentation

## 2018-03-26 DIAGNOSIS — I129 Hypertensive chronic kidney disease with stage 1 through stage 4 chronic kidney disease, or unspecified chronic kidney disease: Secondary | ICD-10-CM | POA: Insufficient documentation

## 2018-03-26 DIAGNOSIS — R1084 Generalized abdominal pain: Secondary | ICD-10-CM | POA: Diagnosis not present

## 2018-03-26 DIAGNOSIS — N189 Chronic kidney disease, unspecified: Secondary | ICD-10-CM | POA: Insufficient documentation

## 2018-03-26 DIAGNOSIS — Z79899 Other long term (current) drug therapy: Secondary | ICD-10-CM | POA: Insufficient documentation

## 2018-03-26 DIAGNOSIS — K5900 Constipation, unspecified: Secondary | ICD-10-CM | POA: Insufficient documentation

## 2018-03-26 DIAGNOSIS — M79622 Pain in left upper arm: Secondary | ICD-10-CM | POA: Insufficient documentation

## 2018-03-26 MED ORDER — MAGNESIUM CITRATE PO SOLN
1.0000 | Freq: Once | ORAL | Status: AC
  Administered 2018-03-26: 1 via ORAL
  Filled 2018-03-26: qty 296

## 2018-03-26 MED ORDER — POLYETHYLENE GLYCOL 3350 17 GM/SCOOP PO POWD: ORAL | 0 refills | Status: AC

## 2018-03-26 MED ORDER — MINERAL OIL RE ENEM: 1.0000 | ENEMA | Freq: Once | RECTAL | Status: DC

## 2018-03-26 MED ORDER — DOCUSATE SODIUM 50 MG/5ML PO LIQD
100.0000 mg | Freq: Once | ORAL | Status: AC
  Administered 2018-03-26: 100 mg via ORAL
  Filled 2018-03-26: qty 10

## 2018-03-26 NOTE — ED Notes (Signed)
Pt assisted back to bed after having successful bowel movement. Pt states he feels "a little bit better".

## 2018-03-26 NOTE — ED Triage Notes (Addendum)
Patient ambulatory to triage with steady gait, without difficulty or distress noted; pt reports no BM in 2 days unrelieved by stool softener; pt had dialysis shunt placed to left arm 7/10; c/o pain and swelling noted to upper arm; has appt tomorrow at [facility] for f/u

## 2018-03-26 NOTE — ED Notes (Signed)
NAD noted at time of D/C. Pt escorted to lobby via wheelchair. Accompanied by mother.

## 2018-03-26 NOTE — ED Notes (Signed)
Pt to ct at this time.

## 2018-03-26 NOTE — Discharge Instructions (Addendum)
Your ultrasound of the left arm and dialysis graft were okay today. Please follow up with your vascular surgeon for further evaluation of your healing and removal of the staples.

## 2018-03-26 NOTE — ED Notes (Signed)
Pt currently using the bathroom after receiving soap suds enema. Pt able to pass some stool at this time.

## 2018-03-26 NOTE — ED Provider Notes (Signed)
[facility] Emergency Department Provider Note  ____________________________________________  Time seen: Approximately 8:43 AM  I have reviewed the triage vital signs and the nursing notes.   HISTORY  Chief Complaint Constipation and Post-op Problem    HPI Ronnie Keith is a 57 y.o. male with a history of CKD, hypertension, intellectual disability who was brought to the ED today complaining of generalized abdominal pain, gradual onset, worsening, constant, cramping, waxing and waning without aggravating or alleviating factors, nonradiating associated with decreased bowel movements over the past week.  No vomiting.  Still tolerating oral intake.  Denies fevers chills chest pain shortness of breath.  As a side issue, he recently had dialysis graft placed in the left upper arm 2 weeks ago.  He complains of pain and swelling there since the procedure that is not worsening and has a follow-up appointment at [facility] vascular surgery tomorrow.   He does mom confirm this is not the reason for his ED visit today.  Primarily just worried about constipation.  Currently getting dialysis through a catheter present at the right chest wall.  Had dialysis on Saturday and Tuesday.   Past Medical History:  Diagnosis Date  . Anxiety   . Chronic kidney disease   . Chronic low back pain   . Hypertension   . Sciatica   . Scoliosis      Patient Active Problem List   Diagnosis Date Noted  . 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) 07/30/2017  . Elevated ferritin 07/30/2017  . Macrocytic anemia 07/30/2017  . Chronic kidney disease 07/17/2016  . Intellectual disability 07/16/2016  . Major depressive disorder, recurrent severe without psychotic features ([place]) 09/28/2015  . Hyperkalemia 09/20/2015     Past Surgical History:  Procedure Laterality Date  . CLEFT PALATE REPAIR    . EYE SURGERY     "when I was small"  . PERIPHERAL VASCULAR  CATHETERIZATION N/A 09/29/2015   Procedure: Dialysis/Perma Catheter Insertion;  Surgeon: Katha Cabal, MD;  Location: [facility];  Service: Cardiovascular;  Laterality: N/A;  . ROTATOR CUFF REPAIR Left      Prior to Admission medications   Medication Sig Start Date End Date Taking? Authorizing Provider  acetaminophen (TYLENOL) 325 MG tablet Take 650 mg by mouth every 6 (six) hours as needed for mild pain or moderate pain.    [provider]  aspirin EC 81 MG tablet Take 81 mg by mouth daily.    [provider]  buPROPion (WELLBUTRIN XL) 150 MG 24 hr tablet Take 1 tablet (150 mg total) by mouth daily. 07/30/16   Pucilowska, Herma Ard B, MD  calcium acetate (PHOSLO) 667 MG capsule Take 2 capsules (1,334 mg total) by mouth 3 (three) times daily with meals. 07/29/16   Pucilowska, Jolanta B, MD  fluticasone (FLONASE) 50 MCG/ACT nasal spray Place 1 spray into both nostrils daily.    [provider]  MELATONIN PO Take 1 tablet by mouth daily.    [provider]  metoprolol succinate (TOPROL-XL) 50 MG 24 hr tablet Take 50 mg by mouth daily. 03/28/16 05/06/21  [provider]  omeprazole (PRILOSEC) 40 MG capsule Take 40 mg by mouth daily.    [provider]  polyethylene glycol powder (GLYCOLAX/MIRALAX) powder 1 cap full in a full glass of water, two times a day for 3 days. 03/26/18   Carrie Mew, MD  QUEtiapine (SEROQUEL) 200 MG tablet Take 1 tablet (200 mg total) by mouth  at bedtime. 07/29/16   Pucilowska, Herma Ard B, MD  rOPINIRole (REQUIP) 2 MG tablet Take 1 tablet (2 mg total) by mouth at bedtime. 07/29/16   Pucilowska, Jolanta B, MD  senna-docusate (SENOKOT-S) 8.6-50 MG tablet Take 2 tablets by mouth 2 (two) times daily. 10/07/15   Gladstone Lighter, MD  traMADol (ULTRAM) 50 MG tablet Take 1 tablet (50 mg total) by mouth every 6 (six) hours as needed for severe pain. 07/29/16   Pucilowska, Wardell Honour, MD      Allergies Vancomycin   Family History  Problem Relation Age of Onset  . Hypertension Other     Social History Social History   Tobacco Use  . Smoking status: Never Smoker  . Smokeless tobacco: Never Used  Substance Use Topics  . Alcohol use: No  . Drug use: No    Review of Systems  Constitutional:   No fever or chills.  Cardiovascular:   No chest pain or syncope. Respiratory:   No dyspnea or cough. Gastrointestinal: Positive for abdominal pain.  No vomiting.  Positive constipation. Musculoskeletal: Left upper arm pain as above. All other systems reviewed and are negative except as documented above in ROS and HPI.  ____________________________________________   PHYSICAL EXAM:  VITAL SIGNS: ED Triage Vitals  Enc Vitals Group     BP 03/26/18 0514 124/73     Pulse Rate 03/26/18 0514 71     Resp 03/26/18 0514 18     Temp --      Temp src --      SpO2 03/26/18 0514 99 %     Weight 03/26/18 0513 225 lb (102.1 kg)     Height 03/26/18 0513 5\' 10"  (1.778 m)     Head Circumference --      Peak Flow --      Pain Score --      Pain Loc --      Pain Edu? --      Excl. in [place]? --     Vital signs reviewed, nursing assessments reviewed.   Constitutional:   Alert and oriented. Non-toxic appearance. Eyes:   Conjunctivae are normal. EOMI.  ENT      Head:   Normocephalic and atraumatic.      Nose:   No congestion/rhinnorhea.       Mouth/Throat:   MMM, no pharyngeal erythema. No peritonsillar mass.       Neck:   No meningismus. Full ROM. Hematological/Lymphatic/Immunilogical:   No cervical lymphadenopathy. Cardiovascular:   RRR. Symmetric bilateral radial and DP pulses.  No murmurs. Cap refill less than 2 seconds. Respiratory:   Normal respiratory effort without tachypnea/retractions. Breath sounds are clear and equal bilaterally. No wheezes/rales/rhonchi. Gastrointestinal:   Soft and nontender.  Moderately distended. There is no CVA tenderness.  No rebound,  rigidity, or guarding. Musculoskeletal:   Normal range of motion in all extremities. No joint effusions.  No lower extremity tenderness.  No edema.  2 incisions on the left upper arm appear to be completely healed.  Staples still present, some appear to be embedded in the skin.  There is some mild inflammatory change without streaking warmth tenderness fluctuance induration or discharge.  I suspect this is subacute related to the retained staples that show signs of beginning to migrate across the now healed incision Neurologic:   Normal speech and language.  Motor grossly intact. No acute focal neurologic deficits are appreciated.  Skin:    Skin is warm, dry and intact.  Incisional erythema as above.  No rash.  No crepitus including in the left arm, axilla, chest wall, neck. ____________________________________________    LABS (pertinent positives/negatives) (all labs ordered are listed, but only abnormal results are displayed) Labs Reviewed - No data to display ____________________________________________   EKG Interpreted by me Sinus rhythm rate of 84, normal axis, normal intervals.  Normal QRS ST segments and T waves.  No evidence of electrolyte disturbance.  Nonischemic   ____________________________________________    RADIOLOGY  Dg Abdomen 1 View  Result Date: 03/26/2018 CLINICAL DATA:  No bowel movement for 2 days. EXAM: ABDOMEN - 1 VIEW COMPARISON:  09/21/2015 FINDINGS: Diffusely stool-filled colon. No small or large bowel distention. No radiopaque stones. Lumbar scoliosis convex towards the right. Degenerative changes in the lumbar spine. IMPRESSION: Diffusely stool-filled colon. No evidence of obstruction. Electronically Signed   By: Lucienne Capers M.D.   On: 03/26/2018 06:03   US Venous Img Upper Uni Left  Result Date: 03/26/2018 CLINICAL DATA:  57 year old male with a history of left arm swelling EXAM: LEFT UPPER EXTREMITY VENOUS DOPPLER ULTRASOUND TECHNIQUE: Gray-scale  sonography with graded compression, as well as color Doppler and duplex ultrasound were performed to evaluate the upper extremity deep venous system from the level of the subclavian vein and including the jugular, axillary, basilic, radial, ulnar and upper cephalic vein. Spectral Doppler was utilized to evaluate flow at rest and with distal augmentation maneuvers. COMPARISON:  None. FINDINGS: Contralateral Subclavian Vein: Respiratory phasicity is normal and symmetric with the symptomatic side. No evidence of thrombus. Normal compressibility. Internal Jugular Vein: No evidence of thrombus. Normal compressibility, respiratory phasicity and response to augmentation. Subclavian Vein: No evidence of thrombus. Normal compressibility, respiratory phasicity and response to augmentation. Axillary Vein: No evidence of thrombus. Normal compressibility, respiratory phasicity and response to augmentation. Cephalic Vein: No evidence of thrombus. Normal compressibility, respiratory phasicity and response to augmentation. Basilic Vein: No evidence of thrombus. Normal compressibility, respiratory phasicity and response to augmentation. Brachial Veins: No evidence of thrombus. Normal compressibility, respiratory phasicity and response to augmentation. Radial Veins: No evidence of thrombus. Normal compressibility, respiratory phasicity and response to augmentation. Ulnar Veins: No evidence of thrombus. Normal compressibility, respiratory phasicity and response to augmentation. Other Findings:  Patent left-sided dialysis circuit IMPRESSION: Sonographic survey of the left upper extremity negative for DVT. Patent dialysis circuit of the left upper extremity. Electronically Signed   By: Corrie Mckusick D.O.   On: 03/26/2018 07:43    ____________________________________________   PROCEDURES Procedures  ____________________________________________  DIFFERENTIAL DIAGNOSIS   Constipation, bowel obstruction, ileus.  Low suspicion  of DVT or soft tissue infection in the arm.  CLINICAL IMPRESSION / ASSESSMENT AND PLAN / ED COURSE  Pertinent labs & imaging results that were available during my care of the patient were reviewed by me and considered in my medical decision making (see chart for details).    Patient nontoxic, not in distress, presents with abdominal discomfort.  X-ray shows large stool burden.  Patient provided an enema in the ED, had a bowel movement, feels better.  He is already prescribed Senokot.  I will add MiraLAX.  Continue outpatient follow-up with vascular surgery.  Ultrasound of the left arm shows patent dialysis circuit, no signs of soft tissue infection or pseudoaneurysm or other complication.      ____________________________________________   FINAL CLINICAL IMPRESSION(S) / ED DIAGNOSES    Final diagnoses:  Generalized abdominal pain  Constipation, unspecified constipation type     ED Discharge Orders  Ordered    polyethylene glycol powder (GLYCOLAX/MIRALAX) powder     03/26/18 0843      Portions of this note were generated with dragon dictation software. Dictation errors may occur despite best attempts at proofreading.    Carrie Mew, MD 03/26/18 [phone]

## 2018-03-27 ENCOUNTER — Telehealth: Payer: Self-pay | Admitting: *Deleted

## 2018-03-27 NOTE — Telephone Encounter (Signed)
Erroneous entry

## 2018-03-27 NOTE — Telephone Encounter (Signed)
Called patient's mother and LVM that patient does not have to come for labs.  He does, however, needs to see MD every 3 months.

## 2018-04-08 ENCOUNTER — Inpatient Hospital Stay: Payer: Medicare Other

## 2018-04-22 ENCOUNTER — Inpatient Hospital Stay: Payer: Medicare Other

## 2018-05-06 ENCOUNTER — Inpatient Hospital Stay: Payer: Medicare Other

## 2018-05-19 ENCOUNTER — Other Ambulatory Visit: Payer: Self-pay

## 2018-05-20 ENCOUNTER — Inpatient Hospital Stay: Payer: Medicare Other | Attending: Hematology and Oncology | Admitting: Hematology and Oncology

## 2018-05-20 ENCOUNTER — Inpatient Hospital Stay (HOSPITAL_BASED_OUTPATIENT_CLINIC_OR_DEPARTMENT_OTHER): Payer: Medicare Other | Admitting: Hematology and Oncology

## 2018-05-20 DIAGNOSIS — D61818 Other pancytopenia: Secondary | ICD-10-CM

## 2018-05-20 DIAGNOSIS — R634 Abnormal weight loss: Secondary | ICD-10-CM | POA: Diagnosis not present

## 2018-05-20 DIAGNOSIS — Z992 Dependence on renal dialysis: Secondary | ICD-10-CM | POA: Diagnosis not present

## 2018-05-20 DIAGNOSIS — Z8249 Family history of ischemic heart disease and other diseases of the circulatory system: Secondary | ICD-10-CM | POA: Diagnosis not present

## 2018-05-20 DIAGNOSIS — D631 Anemia in chronic kidney disease: Secondary | ICD-10-CM | POA: Diagnosis not present

## 2018-05-20 DIAGNOSIS — I120 Hypertensive chronic kidney disease with stage 5 chronic kidney disease or end stage renal disease: Secondary | ICD-10-CM | POA: Diagnosis not present

## 2018-05-20 DIAGNOSIS — D696 Thrombocytopenia, unspecified: Secondary | ICD-10-CM

## 2018-05-20 DIAGNOSIS — N186 End stage renal disease: Secondary | ICD-10-CM | POA: Diagnosis not present

## 2018-05-20 DIAGNOSIS — D72819 Decreased white blood cell count, unspecified: Secondary | ICD-10-CM

## 2018-05-20 DIAGNOSIS — D539 Nutritional anemia, unspecified: Secondary | ICD-10-CM

## 2018-05-20 DIAGNOSIS — Z79899 Other long term (current) drug therapy: Secondary | ICD-10-CM | POA: Insufficient documentation

## 2018-05-20 DIAGNOSIS — Z7982 Long term (current) use of aspirin: Secondary | ICD-10-CM | POA: Diagnosis not present

## 2018-05-20 LAB — CBC WITH DIFFERENTIAL/PLATELET
BASOS ABS: 0 10*3/uL (ref 0–0.1)
Basophils Relative: 1 %
Eosinophils Absolute: 0.1 10*3/uL (ref 0–0.7)
Eosinophils Relative: 4 %
HEMATOCRIT: 28.2 % — AB (ref 40.0–52.0)
HEMOGLOBIN: 9.5 g/dL — AB (ref 13.0–18.0)
LYMPHS PCT: 37 %
Lymphs Abs: 0.9 10*3/uL — ABNORMAL LOW (ref 1.0–3.6)
MCH: 33.6 pg (ref 26.0–34.0)
MCHC: 33.9 g/dL (ref 32.0–36.0)
MCV: 99.1 fL (ref 80.0–100.0)
MONO ABS: 0.2 10*3/uL (ref 0.2–1.0)
Monocytes Relative: 10 %
NEUTROS ABS: 1.2 10*3/uL — AB (ref 1.4–6.5)
NEUTROS PCT: 48 %
Platelets: 78 10*3/uL — ABNORMAL LOW (ref 150–440)
RBC: 2.84 MIL/uL — AB (ref 4.40–5.90)
RDW: 16.8 % — ABNORMAL HIGH (ref 11.5–14.5)
WBC: 2.5 10*3/uL — AB (ref 3.8–10.6)

## 2018-05-20 LAB — RETICULOCYTES
RBC.: 2.87 MIL/uL — AB (ref 4.40–5.90)
RETIC COUNT ABSOLUTE: 23 10*3/uL (ref 19.0–183.0)
Retic Ct Pct: 0.8 % (ref 0.4–3.1)

## 2018-05-20 LAB — FERRITIN: Ferritin: 260 ng/mL (ref 24–336)

## 2018-05-20 NOTE — Progress Notes (Signed)
[facility] day:  05/20/2018  Chief Complaint: Ronnie Keith is a 57 y.o. male with end stage renal disease on dialysis, hemochromatosis, and anemia of chronic renal disease who is seen for 3 month assessment  HPI: The patient was last seen in the hematology clinic on 02/11/2018.   At that time, he denied any complaint.  Patient denied any acute concerns today. Blood pressure was stable. Exam was stable. Hemoglobin was 10.5, hematocrit 31.2, MCV 96.6, and platelets 104,000.  Potassium 5.2 (elevated). BUN 73 and creatinine 9.09 (CrCl 11.3 mL/min). Ferritin was 450.   After discussion with his nephrologist, Dr Holley Raring, decision was made for small volume phlebotomy by discarding tubing with blood after his dialysis.  During the interim, he has done well.  He voices no complaints.  He continues on dialysis.  A fistula was placed.  Since placement, his left hand is a little numb.  He describes trouble picking up things.  His physicians are aware.  He denies any excess bruising or bleeding.   Past Medical History:  Diagnosis Date  . Anxiety   . Chronic kidney disease   . Chronic low back pain   . Hypertension   . Sciatica   . Scoliosis     Past Surgical History:  Procedure Laterality Date  . CLEFT PALATE REPAIR    . EYE SURGERY     "when I was small"  . PERIPHERAL VASCULAR CATHETERIZATION N/A 09/29/2015   Procedure: Dialysis/Perma Catheter Insertion;  Surgeon: Katha Cabal, MD;  Location: [facility];  Service: Cardiovascular;  Laterality: N/A;  . ROTATOR CUFF REPAIR Left     Family History  Problem Relation Age of Onset  . Hypertension Other     Social History:  reports that he has never smoked. He has never used smokeless tobacco. He reports that he does not drink alcohol or use drugs.  Patient is on disability. There have been no known exposures to radiation or toxins.  He lives in [place] with his sister and mother [name]).  He  likes to bowl and go "people watching".  His sister has cerebral palsy. The patient is accompanied by his mother today.  Allergies:  Allergies  Allergen Reactions  . Vancomycin Rash    Current Medications: Current Outpatient Medications  Medication Sig Dispense Refill  . acetaminophen (TYLENOL) 325 MG tablet Take 650 mg by mouth every 6 (six) hours as needed for mild pain or moderate pain.    Marland Kitchen aspirin EC 81 MG tablet Take 81 mg by mouth daily.    Marland Kitchen buPROPion (WELLBUTRIN XL) 150 MG 24 hr tablet Take 1 tablet (150 mg total) by mouth daily. 30 tablet 1  . calcium acetate (PHOSLO) 667 MG capsule Take 2 capsules (1,334 mg total) by mouth 3 (three) times daily with meals. 180 capsule 1  . fluticasone (FLONASE) 50 MCG/ACT nasal spray Place 1 spray into both nostrils daily.    Marland Kitchen MELATONIN PO Take 1 tablet by mouth daily.    . metoprolol succinate (TOPROL-XL) 50 MG 24 hr tablet Take 50 mg by mouth daily.    Marland Kitchen omeprazole (PRILOSEC) 40 MG capsule Take 40 mg by mouth daily.    . polyethylene glycol powder (GLYCOLAX/MIRALAX) powder 1 cap full in a full glass of water, two times a day for 3 days. 255 g 0  . QUEtiapine (SEROQUEL) 200 MG tablet Take 1 tablet (200 mg total) by mouth at bedtime. 30 tablet  1  . rOPINIRole (REQUIP) 2 MG tablet Take 1 tablet (2 mg total) by mouth at bedtime. 30 tablet 1  . senna-docusate (SENOKOT-S) 8.6-50 MG tablet Take 2 tablets by mouth 2 (two) times daily. 60 tablet 2  . traMADol (ULTRAM) 50 MG tablet Take 1 tablet (50 mg total) by mouth every 6 (six) hours as needed for severe pain. 30 tablet 0   No current facility-administered medications for this visit.    Facility-Administered Medications Ordered in Other Visits  Medication Dose Route Frequency Provider Last Rate Last Dose  . sodium chloride flush (NS) 0.9 % injection 10 mL  10 mL Intravenous PRN Nolon Stalls C, MD   10 mL at 09/17/17 1000    Review of Systems  Constitutional: Positive for weight loss (9  pounds). Negative for chills, diaphoresis, fever and malaise/fatigue.       Feels "ok, good".  HENT: Negative.  Negative for congestion, ear discharge, ear pain, nosebleeds, sinus pain and sore throat.   Eyes: Negative.  Negative for blurred vision, double vision, photophobia, pain and discharge.  Respiratory: Positive for shortness of breath (exertional). Negative for cough, hemoptysis and sputum production.   Cardiovascular: Negative.  Negative for chest pain, palpitations, orthopnea, leg swelling and PND.  Gastrointestinal: Positive for heartburn (on PPI). Negative for abdominal pain, blood in stool, constipation, diarrhea, melena, nausea and vomiting.  Genitourinary: Negative.  Negative for dysuria, frequency, hematuria and urgency.  Musculoskeletal: Negative for back pain, falls and myalgias.  Skin: Negative.  Negative for itching and rash.  Neurological: Positive for sensory change (left hand a little numb after procedure). Negative for dizziness, tremors, focal weakness, seizures, weakness and headaches.  Endo/Heme/Allergies: Does not bruise/bleed easily.  Psychiatric/Behavioral: Negative for depression, memory loss and suicidal ideas. The patient is not nervous/anxious and does not have insomnia.   All other systems reviewed and are negative.  Performance status (ECOG): 2 - Symptomatic, <50% confined to bed  Physical Exam: Blood pressure 122/79, pulse 79, temperature (!) 97.5 F (36.4 C), resp. rate 18, weight 222 lb 0.1 oz (100.7 kg), SpO2 99 %. GENERAL:  Well developed, well nourished, gentleman sitting comfortably in the exam room in no acute distress. MENTAL STATUS:  Alert and oriented to person, place and time. HEAD:  Thin blonde hair.  Normocephalic, atraumatic, face symmetric, no Cushingoid features. EYES:  Glasses.  Blue eyes.  Pupils equal round and reactive to light and accomodation.  No conjunctivitis or scleral icterus. ENT:  Oropharynx clear without lesion.  Tongue  normal. Mucous membranes moist.  RESPIRATORY:  Clear to auscultation without rales, wheezes or rhonchi. CARDIOVASCULAR:  Regular rate and rhythm without murmur, rub or gallop. CHEST WALL:  Right sided perm cath. ABDOMEN:  Soft, non-tender, with active bowel sounds, and no hepatosplenomegaly.  No masses. SKIN:  Fair.  No rashes, ulcers or lesions. EXTREMITIES: Left AV fistula with a thrill.  Chronic lower extremity edema.  No skin discoloration or tenderness.  No palpable cords. LYMPH NODES: No palpable cervical, supraclavicular, axillary or inguinal adenopathy  NEUROLOGICAL: Unremarkable. PSYCH:  Appropriate.    Office Visit on 05/20/2018  Component Date Value Ref Range Status  . Ferritin 05/20/2018 260  24 - 336 ng/mL Final   Performed at [facility], [address] [place], [place] [address]  . WBC 05/20/2018 2.5* 3.8 - 10.6 K/uL Final  . RBC 05/20/2018 2.84* 4.40 - 5.90 MIL/uL Final  . Hemoglobin 05/20/2018 9.5* 13.0 - 18.0 g/dL Final  . HCT 05/20/2018 28.2*  40.0 - 52.0 % Final  . MCV 05/20/2018 99.1  80.0 - 100.0 fL Final  . MCH 05/20/2018 33.6  26.0 - 34.0 pg Final  . MCHC 05/20/2018 33.9  32.0 - 36.0 g/dL Final  . RDW 05/20/2018 16.8* 11.5 - 14.5 % Final  . Platelets 05/20/2018 78* 150 - 440 K/uL Final  . Neutrophils Relative % 05/20/2018 48  % Final  . Neutro Abs 05/20/2018 1.2* 1.4 - 6.5 K/uL Final  . Lymphocytes Relative 05/20/2018 37  % Final  . Lymphs Abs 05/20/2018 0.9* 1.0 - 3.6 K/uL Final  . Monocytes Relative 05/20/2018 10  % Final  . Monocytes Absolute 05/20/2018 0.2  0.2 - 1.0 K/uL Final  . Eosinophils Relative 05/20/2018 4  % Final  . Eosinophils Absolute 05/20/2018 0.1  0 - 0.7 K/uL Final  . Basophils Relative 05/20/2018 1  % Final  . Basophils Absolute 05/20/2018 0.0  0 - 0.1 K/uL Final   Performed at [facility], [address], [place], [place] [address]  . [facility] test code 05/20/2018 150018   Final  . [facility] test name 05/20/2018  HIT PANEL WITH REFLEX TO SEROTONIN ASSAY 150018   Final   Performed at [facility], [address] [place], [place] [address]  . Misc [facility] result 05/20/2018 COMMENT   Final   Comment: (NOTE) Test Ordered: 150018 Serotonin Release Assay SRA, Low Dose Heparin          1                %        BN     Reference Range: 0-20                                  This test was developed and its performance characteristics determined by [facility]. It has not been cleared or approved by the Food and Drug Administration. SRA, High Dose Heparin         1                %        BN     Reference Range: 0-20                                  This test was developed and its performance characteristics determined by [facility]. It has not been cleared or approved by the Food and Drug Administration. SRA, Interpretation            Comment                   BN   SRA Result:  NEGATIVE COMMENT:  While these results argue against a diagnosis of heparin- induced-thrombocytopenia (HIT), they do not completely exclude the diagnosis.  The result should be interpreted in conjunction with other HIT assays, and the context of all the clinical information including the platelet count,                           the type of heparin administered, the duration of heparin exposure, previous heparin exposure and any thrombotic history.  The assay measures serotonin release from donor platelets in the presence of patient's serum and heparin.  A positive result requires greater than or equal to 20% release in the  presence of low dose (0.2 IU/mL) heparin and inhibition of serotonin release in the presence of high dose (100 IU/mL) heparin. Performed At: [facility] [place], [place] [id number] Rush Farmer MD [id number]   . Retic Ct Pct 05/20/2018 0.8  0.4 - 3.1 % Final  . RBC. 05/20/2018 2.87* 4.40 - 5.90 MIL/uL Final  . Retic Count, Absolute 05/20/2018 23.0  19.0 - 183.0 K/uL Final    Performed at [facility], [address], [place], [place] [address]  . Anti Nuclear Antibody(ANA) 05/20/2018 Negative  Negative Final   Comment: (NOTE) Performed At: [facility] [place], [place] [id number] Rush Farmer MD [id number]   . HIV Screen 4th Generation wRfx 05/20/2018 Non Reactive  Non Reactive Final   Comment: (NOTE) Performed At: [facility] [place], [place] [id number] Rush Farmer MD [id number]   . HCV Ab 05/20/2018 <0.1  0.0 - 0.9 s/co ratio Final   Comment: (NOTE)                                  Negative:     < 0.8                             Indeterminate: 0.8 - 0.9                                  Positive:     > 0.9 The CDC recommends that a positive HCV antibody result be followed up with a HCV Nucleic Acid Amplification test (157262). Performed At: [facility] [place], [place] [id number] Rush Farmer MD [id number]   . Hepatitis B Surface Ag 05/20/2018 Negative  Negative Final   Comment: (NOTE) Performed At: [facility] [place], [place] [id number] Rush Farmer MD [id number]   . Hep B Core Total Ab 05/20/2018 Negative  Negative Final   Comment: (NOTE) Performed At: [facility] [place], [place] [id number] Rush Farmer MD [id number]     Assessment:  Ronnie Keith is a 57 y.o. male with hemochromatosis.  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  He presented with an elevated iron saturation (47%) and ferritin.  His mother has hemochromatosis.  Work-up on 07/30/2017 revealed two mutations (C282Y and H63D).  Iron saturation was 46% with a TIBC of 202.  Ferritin was 1143.  CRP was < 0.8.  Sed rate was 31 (0-20).  B12 was 448.  Folate was 13.1.  CBC revealed a hematocrit of 34.1, hemoglobin 11.3, MCV 99.7, platelets 107,000, WBC 4100 with an ANC of 2300.  Ferritin has been followed: 680 on  06/18/2017, 1143 on 07/30/2017, 636 on 09/17/2017, 537 on 11/19/2017, 640 on 12/17/2017, 531 on 01/28/2018, and 450 on 02/11/2018.  Ferritin goal is < 50.  He has renal failure and has been on dialysis x 2 years.  He has associated anemia of chronic renal disease.  He receives Procrit.  RUQ ultrasound on 09/22/2017 a normal hepatic echotexture.  There was no mass.  AFP was 1.2 on 09/17/2017 and 0.8 on 02/11/2018.  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  Colonoscopy on 09/08/2017 at [facility] revealed an 8 mm polyp in the ascending colon and  3 mm polyp at the hepatic flexure, removed with a cold snare.  There were internal hemorrhoids. Pathology revealed sessile serrated adenomas that were negative for conventional dysplasia  He is handicapped and is on disability.  Symptomatically, he notes left hand numbness s/p AV fistula placement.  Exam is unremarkable.  Hemoglobin is 9.5, platelets 78,000, WBC 2500 with an ANC of 1200.  Plan: 1. Labs today:  CBC with diff, ferritin, retic, ANA with reflex, HIV testing (patient consented), hepatitis B core antibody total, hepatitis B surface antigen, hepatitis C antibody. 2. Hemochromatosis:  Discuss plan for ongoing surveillance with AFP and RUQ ultrasound every 6 months.  Small volume phlebotomy with dialysis tubing per nephrology.  Ferritin goal < 50. 3. Mild pancytopenia- new:  Etiology unclear.  No new medications or herbal products.  B12, folate, and TSH were normal on 02/11/2018.  Discuss initial laboratory work-up (ANA, HIV testing, hepatitis B and C testing).  Review neutropenic precautions. 4.   Schedule abdominal ultrasound- assess liver and spleen. 5.   RTC in 2 weeks (after ultrasound) for MD assessment and review of work-up.   Lequita Asal, MD  05/20/2018, 4:35 PM

## 2018-05-20 NOTE — Progress Notes (Signed)
Patient offers no complaints today. 

## 2018-05-21 LAB — HEPATITIS C ANTIBODY

## 2018-05-21 LAB — HIV ANTIBODY (ROUTINE TESTING W REFLEX): HIV SCREEN 4TH GENERATION: NONREACTIVE

## 2018-05-21 LAB — HEPATITIS B CORE ANTIBODY, TOTAL: Hep B Core Total Ab: NEGATIVE

## 2018-05-21 LAB — ANA W/REFLEX: ANA: NEGATIVE

## 2018-05-21 LAB — HEPATITIS B SURFACE ANTIGEN: Hepatitis B Surface Ag: NEGATIVE

## 2018-05-27 ENCOUNTER — Ambulatory Visit: Payer: Medicare Other

## 2018-05-29 LAB — MISC LABCORP TEST (SEND OUT): LABCORP TEST CODE: 150018

## 2018-06-03 ENCOUNTER — Other Ambulatory Visit: Payer: Self-pay | Admitting: *Deleted

## 2018-06-03 ENCOUNTER — Ambulatory Visit
Admission: RE | Admit: 2018-06-03 | Discharge: 2018-06-03 | Disposition: A | Discharge status: Home / Self Care | Payer: Medicare Other | Source: Ambulatory Visit | Attending: Urgent Care | Admitting: Urgent Care

## 2018-06-03 ENCOUNTER — Inpatient Hospital Stay: Payer: Medicare Other | Attending: Hematology and Oncology | Admitting: Hematology and Oncology

## 2018-06-03 ENCOUNTER — Inpatient Hospital Stay: Payer: Medicare Other

## 2018-06-03 DIAGNOSIS — Z992 Dependence on renal dialysis: Secondary | ICD-10-CM | POA: Insufficient documentation

## 2018-06-03 DIAGNOSIS — D631 Anemia in chronic kidney disease: Secondary | ICD-10-CM | POA: Diagnosis not present

## 2018-06-03 DIAGNOSIS — I10 Essential (primary) hypertension: Secondary | ICD-10-CM | POA: Diagnosis not present

## 2018-06-03 DIAGNOSIS — Z95828 Presence of other vascular implants and grafts: Secondary | ICD-10-CM

## 2018-06-03 DIAGNOSIS — D539 Nutritional anemia, unspecified: Secondary | ICD-10-CM

## 2018-06-03 DIAGNOSIS — N186 End stage renal disease: Secondary | ICD-10-CM | POA: Insufficient documentation

## 2018-06-03 DIAGNOSIS — R932 Abnormal findings on diagnostic imaging of liver and biliary tract: Secondary | ICD-10-CM | POA: Insufficient documentation

## 2018-06-03 DIAGNOSIS — Z79899 Other long term (current) drug therapy: Secondary | ICD-10-CM

## 2018-06-03 DIAGNOSIS — Z7982 Long term (current) use of aspirin: Secondary | ICD-10-CM | POA: Diagnosis not present

## 2018-06-03 DIAGNOSIS — D61818 Other pancytopenia: Secondary | ICD-10-CM | POA: Diagnosis not present

## 2018-06-03 DIAGNOSIS — I700 Atherosclerosis of aorta: Secondary | ICD-10-CM | POA: Diagnosis not present

## 2018-06-03 DIAGNOSIS — D696 Thrombocytopenia, unspecified: Secondary | ICD-10-CM | POA: Insufficient documentation

## 2018-06-03 DIAGNOSIS — N261 Atrophy of kidney (terminal): Secondary | ICD-10-CM | POA: Insufficient documentation

## 2018-06-03 DIAGNOSIS — N185 Chronic kidney disease, stage 5: Secondary | ICD-10-CM

## 2018-06-03 DIAGNOSIS — D72819 Decreased white blood cell count, unspecified: Secondary | ICD-10-CM

## 2018-06-03 LAB — CBC WITH DIFFERENTIAL/PLATELET
Basophils Absolute: 0 10*3/uL (ref 0–0.1)
Basophils Relative: 1 %
Eosinophils Absolute: 0.1 10*3/uL (ref 0–0.7)
Eosinophils Relative: 4 %
HCT: 30.4 % — ABNORMAL LOW (ref 40.0–52.0)
Hemoglobin: 10.3 g/dL — ABNORMAL LOW (ref 13.0–18.0)
Lymphocytes Relative: 34 %
Lymphs Abs: 1.1 10*3/uL (ref 1.0–3.6)
MCH: 33.7 pg (ref 26.0–34.0)
MCHC: 33.8 g/dL (ref 32.0–36.0)
MCV: 99.5 fL (ref 80.0–100.0)
Monocytes Absolute: 0.3 10*3/uL (ref 0.2–1.0)
Monocytes Relative: 10 %
Neutro Abs: 1.6 10*3/uL (ref 1.4–6.5)
Neutrophils Relative %: 51 %
Platelets: 80 10*3/uL — ABNORMAL LOW (ref 150–440)
RBC: 3.05 MIL/uL — ABNORMAL LOW (ref 4.40–5.90)
RDW: 16.7 % — ABNORMAL HIGH (ref 11.5–14.5)
WBC: 3.1 10*3/uL — ABNORMAL LOW (ref 3.8–10.6)

## 2018-06-03 LAB — IRON AND TIBC
Iron: 88 ug/dL (ref 45–182)
Saturation Ratios: 38 % (ref 17.9–39.5)
TIBC: 229 ug/dL — ABNORMAL LOW (ref 250–450)
UIBC: 141 ug/dL

## 2018-06-03 MED ORDER — HEPARIN SOD (PORK) LOCK FLUSH 100 UNIT/ML IV SOLN
500.0000 [IU] | Freq: Once | INTRAVENOUS | Status: AC
  Administered 2018-06-03: 500 [IU] via INTRAVENOUS

## 2018-06-03 MED ORDER — SODIUM CHLORIDE 0.9% FLUSH
10.0000 mL | INTRAVENOUS | Status: DC | PRN
  Administered 2018-06-03: 10 mL via INTRAVENOUS
  Filled 2018-06-03: qty 10

## 2018-06-03 NOTE — Progress Notes (Addendum)
[facility] day:  06/03/2018   Chief Complaint: Ronnie Keith is a 57 y.o. male with end stage renal disease on dialysis, hemochromatosis, and anemia of chronic renal disease who is seen for review of interval ultrasound and 2 week assessment.  HPI: The patient was last seen in the hematology clinic on 05/20/2018.   At that time, he was doing well.  He denied any concerns.  Labs revealed a hematocrit of 28.2, hemoglobin 9.5, MCV 99.1, platelets 78,000, WBC 2500 with an ANC of 1200.  Ferritin was 260.  Work-up of his mild pancytopenia included the following negative studies:  ANA, hepatitis B surface antigen hepatitis B core antibody, hepatitis C antibody, and HIV testing.  Retic was 0.8.  Abdominal ultrasound on 06/03/2018 revealed atrophic kidneys with mild cystic changes of chronic hemodialysis.  There was diffuse hepatic steatosis and/or hepatocellular disease without focal hepatic parenchymal abnormality.  There was no sonographic evidence of hepatic cirrhosis. Patent portal vein with normal hepatopetal flow.  Spleen was upper normal in size and normal in appearance (unchanged).  During the interim, he notes issues with his blood pressure during dialysis.  He is unsure if they are still taking the tubing during dialysis and throwing the blood away (he is not looking).  He notes that his left hand grip is still not as strong as his right hand.  He feels that the fistula caused some nerve damage.    He denies any interval transfusion.  He is unsure if he is still receiving Procrit.   Past Medical History:  Diagnosis Date  . Anxiety   . Chronic kidney disease   . Chronic low back pain   . Hypertension   . Sciatica   . Scoliosis     Past Surgical History:  Procedure Laterality Date  . CLEFT PALATE REPAIR    . EYE SURGERY     "when I was small"  . PERIPHERAL VASCULAR CATHETERIZATION N/A 09/29/2015   Procedure: Dialysis/Perma Catheter  Insertion;  Surgeon: Katha Cabal, MD;  Location: [facility];  Service: Cardiovascular;  Laterality: N/A;  . ROTATOR CUFF REPAIR Left     Family History  Problem Relation Age of Onset  . Hypertension Other     Social History:  reports that he has never smoked. He has never used smokeless tobacco. He reports that he does not drink alcohol or use drugs.  Patient is on disability. There have been no known exposures to radiation or toxins.  He lives in [place] with his sister and mother Ronnie Keith).  He likes to bowl and go "people watching".  His sister has cerebral palsy. The patient is accompanied by his mother today.  Allergies:  Allergies  Allergen Reactions  . Vancomycin Rash    Current Medications: Current Outpatient Medications  Medication Sig Dispense Refill  . acetaminophen (TYLENOL) 325 MG tablet Take 650 mg by mouth every 6 (six) hours as needed for mild pain or moderate pain.    Marland Kitchen aspirin EC 81 MG tablet Take 81 mg by mouth daily.    Marland Kitchen buPROPion (WELLBUTRIN XL) 150 MG 24 hr tablet Take 1 tablet (150 mg total) by mouth daily. 30 tablet 1  . calcium acetate (PHOSLO) 667 MG capsule Take 2 capsules (1,334 mg total) by mouth 3 (three) times daily with meals. 180 capsule 1  . fluticasone (FLONASE) 50 MCG/ACT nasal spray Place 1 spray into both nostrils daily.    Marland Kitchen MELATONIN  PO Take 1 tablet by mouth daily.    . metoprolol succinate (TOPROL-XL) 50 MG 24 hr tablet Take 50 mg by mouth daily.    Marland Kitchen omeprazole (PRILOSEC) 40 MG capsule Take 40 mg by mouth daily.    . polyethylene glycol powder (GLYCOLAX/MIRALAX) powder 1 cap full in a full glass of water, two times a day for 3 days. 255 g 0  . QUEtiapine (SEROQUEL) 200 MG tablet Take 1 tablet (200 mg total) by mouth at bedtime. 30 tablet 1  . rOPINIRole (REQUIP) 2 MG tablet Take 1 tablet (2 mg total) by mouth at bedtime. 30 tablet 1  . senna-docusate (SENOKOT-S) 8.6-50 MG tablet Take 2 tablets by mouth 2 (two) times daily. 60  tablet 2  . traMADol (ULTRAM) 50 MG tablet Take 1 tablet (50 mg total) by mouth every 6 (six) hours as needed for severe pain. 30 tablet 0   No current facility-administered medications for this visit.    Facility-Administered Medications Ordered in Other Visits  Medication Dose Route Frequency Provider Last Rate Last Dose  . sodium chloride flush (NS) 0.9 % injection 10 mL  10 mL Intravenous PRN Nolon Stalls C, MD   10 mL at 09/17/17 1000    Review of Systems  Constitutional: Positive for weight loss (2 pounds). Negative for chills, diaphoresis, fever and malaise/fatigue.       Feels "fine".  HENT: Negative.  Negative for congestion, ear discharge, ear pain, nosebleeds, sinus pain and sore throat.   Eyes: Negative.  Negative for blurred vision, double vision, photophobia, pain and discharge.  Respiratory: Positive for shortness of breath (exertional). Negative for cough, hemoptysis and sputum production.   Cardiovascular: Negative.  Negative for chest pain, palpitations, orthopnea, leg swelling and PND.       Patient notes low blood pressure with recent dialysis.  Gastrointestinal: Negative for abdominal pain, blood in stool, constipation, diarrhea, heartburn, melena, nausea and vomiting.  Genitourinary: Negative.  Negative for dysuria, frequency, hematuria and urgency.  Musculoskeletal: Negative for back pain, falls and myalgias.  Skin: Negative.  Negative for itching and rash.  Neurological: Positive for sensory change (left hand weakness after fistula placement). Negative for dizziness, tingling, tremors, seizures, weakness and headaches.  Endo/Heme/Allergies: Negative.  Does not bruise/bleed easily.  Psychiatric/Behavioral: Negative for memory loss. The patient is not nervous/anxious and does not have insomnia.   All other systems reviewed and are negative.  Performance status (ECOG): 2 - Symptomatic, <50% confined to bed  Physical Exam: Blood pressure 117/76, pulse 78,  temperature (!) 96.1 F (35.6 C), temperature source Tympanic, resp. rate 18, weight 220 lb 10.9 oz (100.1 kg). GENERAL:  Well developed, well nourished, gentleman sitting comfortably in the exam room in no acute distress. MENTAL STATUS:  Alert and oriented to person, place and time. HEAD:  Thin blonde hair.  Normocephalic, atraumatic, face symmetric, no Cushingoid features. EYES:  Glasses.  Blue eyes.  Pupils equal round and reactive to light and accomodation.  No conjunctivitis or scleral icterus. ENT:  Oropharynx clear without lesion.  Tongue normal. Mucous membranes moist.  RESPIRATORY:  Clear to auscultation without rales, wheezes or rhonchi. CARDIOVASCULAR:  Regular rate and rhythm without murmur, rub or gallop. CHEST WALL;  Right sided perm cath. ABDOMEN:  Soft, non-tender, with active bowel sounds, and no hepatosplenomegaly.  No masses. SKIN:  No rashes, ulcers or lesions. EXTREMITIES: Left sided AV fistula.  Chronic lower extremity edema.  No skin discoloration or tenderness.  No palpable cords. LYMPH NODES:  No palpable cervical, supraclavicular, axillary or inguinal adenopathy  NEUROLOGICAL: Unremarkable. PSYCH:  Appropriate.    No visits with results within 3 Day(s) from this visit.  Latest known visit with results is:  Office Visit on 05/20/2018  Component Date Value Ref Range Status  . Ferritin 05/20/2018 260  24 - 336 ng/mL Final   Performed at [facility], [address] [place], [place] [address]  . WBC 05/20/2018 2.5* 3.8 - 10.6 K/uL Final  . RBC 05/20/2018 2.84* 4.40 - 5.90 MIL/uL Final  . Hemoglobin 05/20/2018 9.5* 13.0 - 18.0 g/dL Final  . HCT 05/20/2018 28.2* 40.0 - 52.0 % Final  . MCV 05/20/2018 99.1  80.0 - 100.0 fL Final  . MCH 05/20/2018 33.6  26.0 - 34.0 pg Final  . MCHC 05/20/2018 33.9  32.0 - 36.0 g/dL Final  . RDW 05/20/2018 16.8* 11.5 - 14.5 % Final  . Platelets 05/20/2018 78* 150 - 440 K/uL Final  . Neutrophils Relative % 05/20/2018 48  %  Final  . Neutro Abs 05/20/2018 1.2* 1.4 - 6.5 K/uL Final  . Lymphocytes Relative 05/20/2018 37  % Final  . Lymphs Abs 05/20/2018 0.9* 1.0 - 3.6 K/uL Final  . Monocytes Relative 05/20/2018 10  % Final  . Monocytes Absolute 05/20/2018 0.2  0.2 - 1.0 K/uL Final  . Eosinophils Relative 05/20/2018 4  % Final  . Eosinophils Absolute 05/20/2018 0.1  0 - 0.7 K/uL Final  . Basophils Relative 05/20/2018 1  % Final  . Basophils Absolute 05/20/2018 0.0  0 - 0.1 K/uL Final   Performed at [facility], [address] [place], [place] [address]  . [facility] test code 05/20/2018 150018   Final  . [facility] test name 05/20/2018 HIT PANEL WITH REFLEX TO SEROTONIN ASSAY 150018   Final   Performed at [facility], [address], [place], [place] [address]  . Misc [facility] result 05/20/2018 COMMENT   Final   Comment: (NOTE) Test Ordered: 150018 Serotonin Release Assay SRA, Low Dose Heparin          1                %        BN     Reference Range: 0-20                                  This test was developed and its performance characteristics determined by [facility]. It has not been cleared or approved by the Food and Drug Administration. SRA, High Dose Heparin         1                %        BN     Reference Range: 0-20                                  This test was developed and its performance characteristics determined by [facility]. It has not been cleared or approved by the Food and Drug Administration. SRA, Interpretation            Comment                   BN   SRA Result:  NEGATIVE COMMENT:  While these results argue against a diagnosis of heparin- induced-thrombocytopenia (  HIT), they do not completely exclude the diagnosis.  The result should be interpreted in conjunction with other HIT assays, and the context of all the clinical information including the platelet count,                           the type of heparin administered, the duration of heparin exposure,  previous heparin exposure and any thrombotic history.  The assay measures serotonin release from donor platelets in the presence of patient's serum and heparin.  A positive result requires greater than or equal to 20% release in the presence of low dose (0.2 IU/mL) heparin and inhibition of serotonin release in the presence of high dose (100 IU/mL) heparin. Performed At: [facility] [place], [place] [id number] Rush Farmer MD PZ:[id number]   . Retic Ct Pct 05/20/2018 0.8  0.4 - 3.1 % Final  . RBC. 05/20/2018 2.87* 4.40 - 5.90 MIL/uL Final  . Retic Count, Absolute 05/20/2018 23.0  19.0 - 183.0 K/uL Final   Performed at [facility], [address], [place], [place] [address]  . Anti Nuclear Antibody(ANA) 05/20/2018 Negative  Negative Final   Comment: (NOTE) Performed At: [facility] [place], [place] [id number] Rush Farmer MD GQ:[id number]   . HIV Screen 4th Generation wRfx 05/20/2018 Non Reactive  Non Reactive Final   Comment: (NOTE) Performed At: [facility] [place], [place] [id number] Rush Farmer MD [id number]   . HCV Ab 05/20/2018 <0.1  0.0 - 0.9 s/co ratio Final   Comment: (NOTE)                                  Negative:     < 0.8                             Indeterminate: 0.8 - 0.9                                  Positive:     > 0.9 The CDC recommends that a positive HCV antibody result be followed up with a HCV Nucleic Acid Amplification test (673419). Performed At: [facility] [place], [place] [id number] Rush Farmer MD DZ:[id number]   . Hepatitis B Surface Ag 05/20/2018 Negative  Negative Final   Comment: (NOTE) Performed At: [facility] [place], [place] [id number] Rush Farmer MD [id number]   . Hep B Core Total Ab 05/20/2018 Negative  Negative Final   Comment: (NOTE) Performed At: [facility] [place], [place] [id number] Rush Farmer MD DJ:[id number]     Assessment:  JAYSIAH MARCHETTA is a 57 y.o. male with hemochromatosis.  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  He presented with an elevated iron saturation (47%) and ferritin.  His mother has hemochromatosis.  Work-up on 07/30/2017 revealed two mutations (C282Y and H63D).  Iron saturation was 46% with a TIBC of 202.  Ferritin was 1143.  CRP was < 0.8.  Sed rate was 31 (0-20).  B12 was 448.  Folate was 13.1.  CBC revealed a hematocrit of 34.1, hemoglobin 11.3, MCV 99.7, platelets 107,000, WBC 4100 with an ANC of 2300.  Ferritin has been followed: 680 on 06/18/2017, 1143 on  07/30/2017, 636 on 09/17/2017, 537 on 11/19/2017, 640 on 12/17/2017, 531 on 01/28/2018, 450 on 02/11/2018, 416 on 02/25/2018, and 260 on 05/20/2018.  Ferritin goal is < 50.  Work-up of mild pancytopenia on 05/20/2018 included the following negative studies:  ANA, hepatitis B surface antigen hepatitis B core antibody, hepatitis C antibody, and HIV testing.  Retic was 0.8.  He has renal failure and has been on dialysis x 2 years.  He has associated anemia of chronic renal disease.  He receives Procrit.  RUQ ultrasound on 09/22/2017 a normal hepatic echotexture.  There was no mass.  Abdominal ultrasound on 06/03/2018 revealed diffuse hepatic steatosis and/or hepatocellular disease without focal hepatic parenchymal abnormality.  There was no sonographic evidence of hepatic cirrhosis. Patent portal vein with normal hepatopetal flow.  Spleen was upper normal in size and normal in appearance (unchanged).  AFP has been followed: 1.2 on 09/17/2017 and 0.8 on 02/11/2018.  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  Colonoscopy on 09/08/2017 at [facility] revealed an 8 mm polyp in the ascending colon and 3 mm polyp at the hepatic flexure, removed with a cold snare.  There were internal hemorrhoids. Pathology revealed sessile  serrated adenomas that were negative for conventional dysplasia  He is handicapped and is on disability.  Symptomatically, he denies any complaints.  He is unaware of his treatment during dialysis.  Exam is stable.  Retic was 0.8 (low) on 05/20/2018.  Ferritin is 260.  Plan: 1. Labs today:  CBC with diff, ferritin, iron studies. 2. Hemochromatosis:  Discuss declining ferritin.  Ferritin goal < 50.  Discuss follow-up with nephrology regarding small volume phlebotomies (dialysis tubing) given recent anemia.  Review interval abdominal ultrasound- no liver lesions.  Spleen normal in size.  Continue AFP and RUQ ultrasound every 6 months for surveillance. 3. Mild pancytopenia:  Etiology remains unclear.  No new medications (per patient) and herbal products.  Laboratory work-up was negative  Consider further testing for anemia (SPEP, FLCA) and thrombocytopenia (HIT).  Discuss with Dr. Holley Raring.  Confirm patient receiving Procrit.  No evidence of bleeding. 4.   RTC in 3 months for MD assessment, labs (CBC with diff, ferritin, iron studies, sed rate).   Lequita Asal, MD  06/03/2018, 1:28 PM

## 2018-06-03 NOTE — Progress Notes (Signed)
Patient here today for US results. 

## 2018-06-07 ENCOUNTER — Encounter: Payer: Self-pay | Admitting: Hematology and Oncology

## 2018-06-07 DIAGNOSIS — D631 Anemia in chronic kidney disease: Secondary | ICD-10-CM | POA: Insufficient documentation

## 2018-06-07 DIAGNOSIS — D72819 Decreased white blood cell count, unspecified: Secondary | ICD-10-CM | POA: Insufficient documentation

## 2018-06-07 DIAGNOSIS — D696 Thrombocytopenia, unspecified: Secondary | ICD-10-CM | POA: Insufficient documentation

## 2018-06-07 DIAGNOSIS — N186 End stage renal disease: Secondary | ICD-10-CM

## 2018-06-07 DIAGNOSIS — Z992 Dependence on renal dialysis: Secondary | ICD-10-CM

## 2018-07-01 ENCOUNTER — Inpatient Hospital Stay: Payer: Medicare Other

## 2018-07-01 ENCOUNTER — Other Ambulatory Visit: Payer: Self-pay | Admitting: *Deleted

## 2018-07-01 DIAGNOSIS — D696 Thrombocytopenia, unspecified: Secondary | ICD-10-CM

## 2018-09-09 ENCOUNTER — Inpatient Hospital Stay: Payer: Medicare Other | Attending: Hematology and Oncology | Admitting: Hematology and Oncology

## 2018-09-09 ENCOUNTER — Inpatient Hospital Stay: Payer: Medicare Other

## 2018-09-09 ENCOUNTER — Other Ambulatory Visit: Payer: Self-pay | Admitting: Hematology and Oncology

## 2018-09-09 ENCOUNTER — Other Ambulatory Visit: Payer: Self-pay

## 2018-09-09 ENCOUNTER — Encounter: Payer: Self-pay | Admitting: Hematology and Oncology

## 2018-09-09 DIAGNOSIS — I120 Hypertensive chronic kidney disease with stage 5 chronic kidney disease or end stage renal disease: Secondary | ICD-10-CM | POA: Diagnosis not present

## 2018-09-09 DIAGNOSIS — D696 Thrombocytopenia, unspecified: Secondary | ICD-10-CM

## 2018-09-09 DIAGNOSIS — D61818 Other pancytopenia: Secondary | ICD-10-CM | POA: Insufficient documentation

## 2018-09-09 DIAGNOSIS — Z8249 Family history of ischemic heart disease and other diseases of the circulatory system: Secondary | ICD-10-CM | POA: Insufficient documentation

## 2018-09-09 DIAGNOSIS — D72819 Decreased white blood cell count, unspecified: Secondary | ICD-10-CM

## 2018-09-09 DIAGNOSIS — Z7982 Long term (current) use of aspirin: Secondary | ICD-10-CM | POA: Diagnosis not present

## 2018-09-09 DIAGNOSIS — R531 Weakness: Secondary | ICD-10-CM

## 2018-09-09 DIAGNOSIS — N186 End stage renal disease: Secondary | ICD-10-CM | POA: Diagnosis not present

## 2018-09-09 DIAGNOSIS — R0609 Other forms of dyspnea: Secondary | ICD-10-CM | POA: Diagnosis not present

## 2018-09-09 DIAGNOSIS — D631 Anemia in chronic kidney disease: Secondary | ICD-10-CM | POA: Diagnosis not present

## 2018-09-09 DIAGNOSIS — Z992 Dependence on renal dialysis: Secondary | ICD-10-CM

## 2018-09-09 DIAGNOSIS — R634 Abnormal weight loss: Secondary | ICD-10-CM | POA: Insufficient documentation

## 2018-09-09 DIAGNOSIS — R7989 Other specified abnormal findings of blood chemistry: Secondary | ICD-10-CM

## 2018-09-09 DIAGNOSIS — Z79899 Other long term (current) drug therapy: Secondary | ICD-10-CM | POA: Insufficient documentation

## 2018-09-09 LAB — CBC WITH DIFFERENTIAL/PLATELET
Abs Immature Granulocytes: 0.01 10*3/uL (ref 0.00–0.07)
Basophils Absolute: 0 10*3/uL (ref 0.0–0.1)
Basophils Relative: 1 %
Eosinophils Absolute: 0.1 10*3/uL (ref 0.0–0.5)
Eosinophils Relative: 4 %
HCT: 33.2 % — ABNORMAL LOW (ref 39.0–52.0)
Hemoglobin: 11 g/dL — ABNORMAL LOW (ref 13.0–17.0)
Immature Granulocytes: 0 %
Lymphocytes Relative: 33 %
Lymphs Abs: 1.2 10*3/uL (ref 0.7–4.0)
MCH: 33 pg (ref 26.0–34.0)
MCHC: 33.1 g/dL (ref 30.0–36.0)
MCV: 99.7 fL (ref 80.0–100.0)
Monocytes Absolute: 0.3 10*3/uL (ref 0.1–1.0)
Monocytes Relative: 7 %
Neutro Abs: 2 10*3/uL (ref 1.7–7.7)
Neutrophils Relative %: 55 %
Platelets: 95 10*3/uL — ABNORMAL LOW (ref 150–400)
RBC: 3.33 MIL/uL — ABNORMAL LOW (ref 4.22–5.81)
RDW: 14.8 % (ref 11.5–15.5)
WBC: 3.7 10*3/uL — ABNORMAL LOW (ref 4.0–10.5)
nRBC: 0 % (ref 0.0–0.2)

## 2018-09-09 LAB — IRON AND TIBC
Iron: 90 ug/dL (ref 45–182)
Saturation Ratios: 35 % (ref 17.9–39.5)
TIBC: 260 ug/dL (ref 250–450)
UIBC: 170 ug/dL

## 2018-09-09 LAB — FERRITIN: Ferritin: 167 ng/mL (ref 24–336)

## 2018-09-09 LAB — SEDIMENTATION RATE: Sed Rate: 15 mm/hr (ref 0–20)

## 2018-09-09 NOTE — Progress Notes (Signed)
Here for follow up.per pt " I feel pretty good " no voiced c/o

## 2018-09-09 NOTE — Progress Notes (Signed)
Ronnie Keith   Chief Complaint: Ronnie Keith is a 58 y.o. male with end stage renal disease on dialysis, hemochromatosis, and anemia of chronic renal disease who is seen for 3 month assessment.  HPI: The patient was last seen in the hematology clinic on 06/03/2018.   At that time, he denied any complaints.  He was unaware of his treatment during dialysis.  Exam was stable.  Retic was 0.8 (low) on 05/20/2018.  Ferritin was 260.  Patient remains on hemodialysis on Tuesday, Thursday, and Saturday. Going well. No recent issues.   Symptomatically, patient is doing well. He continues to have slight exertional dyspnea. No recent episodes of chest pain. He is taking pre-dialysis midodrine, which has stabilized his blood pressures. Patient notes continued issues with weakness in his LEFT hand following AV fistula placement. Patient states, "they hit a nerve. They were going to refer me to neurology". Patient denies that he has experienced any B symptoms. He denies any interval infections.   Patient advises that he maintains an adequate appetite. He is eating well. Weight today is 213 lb 6.5 oz (96.8 kg), which compared to his last visit to the clinic, represents a 7 pound loss.   Patient denies pain in the clinic today.   Past Medical History:  Diagnosis Date  . Anxiety   . Chronic kidney disease   . Chronic low back pain   . Hypertension   . Sciatica   . Scoliosis     Past Surgical History:  Procedure Laterality Date  . CLEFT PALATE REPAIR    . EYE SURGERY     "when I was small"  . PERIPHERAL VASCULAR CATHETERIZATION N/A 09/29/2015   Procedure: Dialysis/Perma Catheter Insertion;  Surgeon: Katha Cabal, MD;  Location: [facility];  Service: Cardiovascular;  Laterality: N/A;  . ROTATOR CUFF REPAIR Left     Family History  Problem Relation Age of Onset  . Hypertension Other     Social History:  reports that he has  never smoked. He has never used smokeless tobacco. He reports that he does not drink alcohol or use drugs.  Patient is on disability. There have been no known exposures to radiation or toxins.  He lives in [place] with his sister and mother [name]).  He likes to bowl and go "people watching".  His sister has cerebral palsy. The patient is accompanied by his mother today.  Allergies:  Allergies  Allergen Reactions  . Vancomycin Rash    Current Medications: Current Outpatient Medications  Medication Sig Dispense Refill  . aspirin EC 81 MG tablet Take 81 mg by mouth daily.    Marland Kitchen buPROPion (WELLBUTRIN XL) 150 MG 24 hr tablet Take 1 tablet (150 mg total) by mouth daily. 30 tablet 1  . calcium acetate (PHOSLO) 667 MG capsule Take 2 capsules (1,334 mg total) by mouth 3 (three) times daily with meals. 180 capsule 1  . fluticasone (FLONASE) 50 MCG/ACT nasal spray Place 1 spray into both nostrils daily.    Marland Kitchen gabapentin (NEURONTIN) 100 MG capsule Take 100 mg by mouth 2 (two) times daily.     Marland Kitchen MELATONIN PO Take 1 tablet by mouth daily.    . metoprolol succinate (TOPROL-XL) 50 MG 24 hr tablet Take 50 mg by mouth daily.    . Multiple Vitamins-Minerals (MULTIVITAMIN ADULT PO) Take by mouth.    Marland Kitchen omeprazole (PRILOSEC) 40 MG capsule Take 40 mg by mouth  daily.    . polyethylene glycol powder (GLYCOLAX/MIRALAX) powder 1 cap full in a full glass of water, two times a day for 3 days. 255 g 0  . QUEtiapine (SEROQUEL) 200 MG tablet Take 1 tablet (200 mg total) by mouth at bedtime. 30 tablet 1  . rOPINIRole (REQUIP) 1 MG tablet TAKE 1 TABLET BY MOUTH IN THE MORNING    . rOPINIRole (REQUIP) 3 MG tablet TAKE 1 TABLET BY MOUTH AT BEDTIME AS DIRECTED    . senna-docusate (SENOKOT-S) 8.6-50 MG tablet Take 2 tablets by mouth 2 (two) times daily. 60 tablet 2  . acetaminophen (TYLENOL) 325 MG tablet Take 650 mg by mouth every 6 (six) hours as needed for mild pain or moderate pain.    Marland Kitchen albuterol (VENTOLIN HFA) 108 (90 Base)  MCG/ACT inhaler INHALE 2 PUFFS INTO LUNGS EVERY 6 HOURS AS NEEDED    . traMADol (ULTRAM) 50 MG tablet Take 1 tablet (50 mg total) by mouth every 6 (six) hours as needed for severe pain. (Patient not taking: Reported on Keith) 30 tablet 0   No current facility-administered medications for this visit.    Facility-Administered Medications Ordered in Other Visits  Medication Dose Route Frequency Provider Last Rate Last Dose  . sodium chloride flush (NS) 0.9 % injection 10 mL  10 mL Intravenous PRN Nolon Stalls C, MD   10 mL at 09/17/17 1000    Review of Systems  Constitutional: Positive for weight loss (7 pounds). Negative for chills, diaphoresis and fever.       Feels "ok".  HENT: Negative.  Negative for congestion, ear discharge, ear pain, nosebleeds, sinus pain and sore throat.   Eyes: Negative.  Negative for blurred vision, double vision, photophobia, pain and discharge.  Respiratory: Positive for shortness of breath (exertional). Negative for cough, hemoptysis and sputum production.   Cardiovascular: Negative.  Negative for chest pain, palpitations, orthopnea, leg swelling and PND.       Takes midodrine pre-dialysis.  Gastrointestinal: Negative.  Negative for abdominal pain, blood in stool, constipation, diarrhea, heartburn, melena and nausea.  Genitourinary: Negative.  Negative for dysuria, frequency, hematuria and urgency.       Dialysis 3x/week.  Musculoskeletal: Negative for back pain, falls, myalgias and neck pain.  Skin: Negative.  Negative for itching and rash.  Neurological: Positive for sensory change (left hand weakness after fistula placement). Negative for dizziness, tingling, tremors, seizures, weakness and headaches.  Endo/Heme/Allergies: Negative.   Psychiatric/Behavioral: Negative for depression and memory loss. The patient is not nervous/anxious and does not have insomnia.   All other systems reviewed and are negative.  Performance status (ECOG): 2  Physical  Exam: Blood pressure 117/75, pulse 79, temperature (!) 97.5 F (36.4 C), resp. rate 18, weight 213 lb 6.5 oz (96.8 kg). GENERAL:  Well developed, well nourished, gentleman sitting comfortably in the exam room in no acute distress. MENTAL STATUS:  Alert and oriented to person, place and time. HEAD:  Thin blonde hair.  Normocephalic, atraumatic, face symmetric, no Cushingoid features. EYES:  Glasses.  Blue eyes.  Pupils equal round and reactive to light and accomodation.  No conjunctivitis or scleral icterus. ENT:  Oropharynx clear without lesion.  Tongue normal.  Missing lower tooth.  Mucous membranes moist.  RESPIRATORY:  Clear to auscultation without rales, wheezes or rhonchi. CARDIOVASCULAR:  Regular rate and rhythm without murmur, rub or gallop. ABDOMEN:  Soft, non-tender, with active bowel sounds, and no hepatosplenomegaly.  No masses. SKIN:  No rashes, ulcers or lesions.  EXTREMITIES: Left sided AV fistula.  Chronic lower extremity edema.  No skin discoloration or tenderness.  No palpable cords. LYMPH NODES: No palpable cervical, supraclavicular, axillary or inguinal adenopathy  NEUROLOGICAL: Unremarkable. PSYCH:  Appropriate.    Appointment on 09/09/2018  Component Date Value Ref Range Status  . Sed Rate 09/09/2018 15  0 - 20 mm/hr Final   Performed at [facility], [address], [place], [place] [address]  . WBC 09/09/2018 3.7* 4.0 - 10.5 K/uL Final  . RBC 09/09/2018 3.33* 4.22 - 5.81 MIL/uL Final  . Hemoglobin 09/09/2018 11.0* 13.0 - 17.0 g/dL Final  . HCT 09/09/2018 33.2* 39.0 - 52.0 % Final  . MCV 09/09/2018 99.7  80.0 - 100.0 fL Final  . MCH 09/09/2018 33.0  26.0 - 34.0 pg Final  . MCHC 09/09/2018 33.1  30.0 - 36.0 g/dL Final  . RDW 09/09/2018 14.8  11.5 - 15.5 % Final  . Platelets 09/09/2018 95* 150 - 400 K/uL Final   Comment: Immature Platelet Fraction may be clinically indicated, consider ordering this additional test [id number]   . nRBC 09/09/2018 0.0   0.0 - 0.2 % Final  . Neutrophils Relative % 09/09/2018 55  % Final  . Neutro Abs 09/09/2018 2.0  1.7 - 7.7 K/uL Final  . Lymphocytes Relative 09/09/2018 33  % Final  . Lymphs Abs 09/09/2018 1.2  0.7 - 4.0 K/uL Final  . Monocytes Relative 09/09/2018 7  % Final  . Monocytes Absolute 09/09/2018 0.3  0.1 - 1.0 K/uL Final  . Eosinophils Relative 09/09/2018 4  % Final  . Eosinophils Absolute 09/09/2018 0.1  0.0 - 0.5 K/uL Final  . Basophils Relative 09/09/2018 1  % Final  . Basophils Absolute 09/09/2018 0.0  0.0 - 0.1 K/uL Final  . Immature Granulocytes 09/09/2018 0  % Final  . Abs Immature Granulocytes 09/09/2018 0.01  0.00 - 0.07 K/uL Final   Performed at [facility], [address], [place], [place] [address]    Assessment:  Ronnie Keith is a 58 y.o. male with hemochromatosis.  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  He presented with an elevated iron saturation (47%) and ferritin.  His mother has hemochromatosis.  Work-up on 07/30/2017 revealed two mutations (C282Y and H63D).  Iron saturation was 46% with a TIBC of 202.  Ferritin was 1143.  CRP was < 0.8.  Sed rate was 31 (0-20).  B12 was 448.  Folate was 13.1.  CBC revealed a hematocrit of 34.1, hemoglobin 11.3, MCV 99.7, platelets 107,000, WBC 4100 with an ANC of 2300.  Ferritin has been followed: 680 on 06/18/2017, 1143 on 07/30/2017, 636 on 09/17/2017, 537 on 11/19/2017, 640 on 12/17/2017, 531 on 01/28/2018, 450 on 02/11/2018, 416 on 02/25/2018, 260 on 05/20/2018, and 167 on 09/09/2018.  Ferritin goal is < 50.  Work-up of mild pancytopenia on 05/20/2018 included the following negative studies:  ANA, hepatitis B surface antigen hepatitis B core antibody, hepatitis C antibody, and HIV testing.  Retic was 0.8.  He has renal failure and has been on dialysis x 2 years.  He has associated anemia of chronic renal disease.  He receives Procrit.  RUQ ultrasound on 09/22/2017 a normal hepatic echotexture.  There was no mass.   Abdominal ultrasound on 06/03/2018 revealed diffuse hepatic steatosis and/or hepatocellular disease without focal hepatic parenchymal abnormality.  There was no sonographic evidence of hepatic cirrhosis. Patent portal vein with normal hepatopetal flow.  Spleen was upper normal in size  and normal in appearance (unchanged).  AFP has been followed: 1.2 on 09/17/2017, 0.8 on 02/11/2018, and < 0.9 on 09/09/2018.  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  Colonoscopy on 09/08/2017 at [facility] revealed an 8 mm polyp in the ascending colon and 3 mm polyp at the hepatic flexure, removed with a cold snare.  There were internal hemorrhoids. Pathology revealed sessile serrated adenomas that were negative for conventional dysplasia  He is handicapped and is on disability.  Symptomatically, he feels "ok".  He is on dialysis 3 days/week.  Exam remains stable.  Ferritin is 167 (improved).  Plan: 1. Labs today: CBC with diff, ferritin, iron studies, sed rate, SPEP, FLCA, AFP. 2. Hemochromatosis  Ferritin is 167 today.  Ferritin goal < 50.  Continue small volume phlebotomies in dialysis (blood in tubing).  Continue AFP and RUQ ultrasound every 6 months for surveillance. 3. Mild pancytopenia  Etiology unclear.  Patient denies new medications or herbal products.  Additional labs drawn today.  Patient receiving Procrit with dialysis.  No bleeding. 4.   Schedule abdominal ultrasound on 12/03/2018. 5.   RTC after ultrasound for MD assessment, labs (CBC with diff, ferritin, iron studies, sed rate, AFP).   Honor Loh, NP  Keith, 2:26 PM  I saw and evaluated the patient, participating in the key portions of the service and reviewing pertinent diagnostic studies and records.  I reviewed the nurse practitioner's note and agree with the findings and the plan.  The assessment and plan were discussed with the patient.  Multiple questions were asked by the patient and  answered.   Nolon Stalls, MD Keith, 2:26 PM

## 2018-09-10 ENCOUNTER — Telehealth: Payer: Self-pay

## 2018-09-10 LAB — PROTEIN ELECTROPHORESIS, SERUM
A/G Ratio: 1.7 (ref 0.7–1.7)
Albumin ELP: 4.3 g/dL (ref 2.9–4.4)
Alpha-1-Globulin: 0.1 g/dL (ref 0.0–0.4)
Alpha-2-Globulin: 0.5 g/dL (ref 0.4–1.0)
Beta Globulin: 0.8 g/dL (ref 0.7–1.3)
Gamma Globulin: 1.1 g/dL (ref 0.4–1.8)
Globulin, Total: 2.5 g/dL (ref 2.2–3.9)
Total Protein ELP: 6.8 g/dL (ref 6.0–8.5)

## 2018-09-10 LAB — KAPPA/LAMBDA LIGHT CHAINS
Kappa free light chain: 129.9 mg/L — ABNORMAL HIGH (ref 3.3–19.4)
Kappa, lambda light chain ratio: 1.26 (ref 0.26–1.65)
Lambda free light chains: 103.5 mg/L — ABNORMAL HIGH (ref 5.7–26.3)

## 2018-09-10 LAB — AFP TUMOR MARKER: AFP, Serum, Tumor Marker: <0.9 ng/mL (ref 0.0–8.3)

## 2018-09-10 NOTE — Telephone Encounter (Signed)
-----   Message from Lequita Asal, MD sent at 09/10/2018 10:27 AM EST ----- Regarding: Please call patient and his mother  Ferritin continues to improve.  M ----- Message ----- From: Interface, Lab In [place] Sent: 09/09/2018   1:35 PM EST To: Lequita Asal, MD

## 2018-09-10 NOTE — Telephone Encounter (Signed)
Spoke with Ronnie Keith to inform her that Ronnie Keith ) ferritin levels are in normal ranage. Ronnie Keith was understanding.

## 2018-12-01 ENCOUNTER — Other Ambulatory Visit: Payer: Self-pay

## 2018-12-01 ENCOUNTER — Ambulatory Visit: Payer: Medicare Other

## 2018-12-02 ENCOUNTER — Inpatient Hospital Stay: Payer: Medicare Other | Attending: Hematology and Oncology

## 2018-12-02 ENCOUNTER — Other Ambulatory Visit: Payer: Self-pay

## 2018-12-02 DIAGNOSIS — D696 Thrombocytopenia, unspecified: Secondary | ICD-10-CM

## 2018-12-02 DIAGNOSIS — D72819 Decreased white blood cell count, unspecified: Secondary | ICD-10-CM | POA: Insufficient documentation

## 2018-12-02 DIAGNOSIS — R7989 Other specified abnormal findings of blood chemistry: Secondary | ICD-10-CM

## 2018-12-02 LAB — IRON AND TIBC
Iron: 68 ug/dL (ref 45–182)
Saturation Ratios: 25 % (ref 17.9–39.5)
TIBC: 274 ug/dL (ref 250–450)
UIBC: 206 ug/dL

## 2018-12-02 LAB — CBC WITH DIFFERENTIAL/PLATELET
Abs Immature Granulocytes: 0.01 10*3/uL (ref 0.00–0.07)
Basophils Absolute: 0 10*3/uL (ref 0.0–0.1)
Basophils Relative: 1 %
Eosinophils Absolute: 0.1 10*3/uL (ref 0.0–0.5)
Eosinophils Relative: 4 %
HCT: 33.9 % — ABNORMAL LOW (ref 39.0–52.0)
Hemoglobin: 11 g/dL — ABNORMAL LOW (ref 13.0–17.0)
Immature Granulocytes: 0 %
Lymphocytes Relative: 39 %
Lymphs Abs: 1.4 10*3/uL (ref 0.7–4.0)
MCH: 32.7 pg (ref 26.0–34.0)
MCHC: 32.4 g/dL (ref 30.0–36.0)
MCV: 100.9 fL — ABNORMAL HIGH (ref 80.0–100.0)
Monocytes Absolute: 0.3 10*3/uL (ref 0.1–1.0)
Monocytes Relative: 8 %
Neutro Abs: 1.9 10*3/uL (ref 1.7–7.7)
Neutrophils Relative %: 48 %
Platelets: 80 10*3/uL — ABNORMAL LOW (ref 150–400)
RBC: 3.36 MIL/uL — ABNORMAL LOW (ref 4.22–5.81)
RDW: 14.5 % (ref 11.5–15.5)
WBC: 3.7 10*3/uL — ABNORMAL LOW (ref 4.0–10.5)
nRBC: 0 % (ref 0.0–0.2)

## 2018-12-02 LAB — SEDIMENTATION RATE: Sed Rate: 14 mm/hr (ref 0–16)

## 2018-12-02 LAB — FERRITIN: Ferritin: 67 ng/mL (ref 24–336)

## 2018-12-03 ENCOUNTER — Other Ambulatory Visit: Payer: Self-pay

## 2018-12-03 ENCOUNTER — Inpatient Hospital Stay (HOSPITAL_BASED_OUTPATIENT_CLINIC_OR_DEPARTMENT_OTHER): Payer: Medicare Other | Admitting: Hematology and Oncology

## 2018-12-03 DIAGNOSIS — D696 Thrombocytopenia, unspecified: Secondary | ICD-10-CM | POA: Diagnosis not present

## 2018-12-03 DIAGNOSIS — Z992 Dependence on renal dialysis: Secondary | ICD-10-CM

## 2018-12-03 DIAGNOSIS — D72819 Decreased white blood cell count, unspecified: Secondary | ICD-10-CM | POA: Diagnosis not present

## 2018-12-03 DIAGNOSIS — D631 Anemia in chronic kidney disease: Secondary | ICD-10-CM

## 2018-12-03 DIAGNOSIS — N186 End stage renal disease: Secondary | ICD-10-CM

## 2018-12-03 LAB — AFP TUMOR MARKER: AFP, Serum, Tumor Marker: <0.9 ng/mL (ref 0.0–8.3)

## 2018-12-03 NOTE — Progress Notes (Signed)
Confirmed patient name, DOB, Address. Denies any concerns at this time.

## 2018-12-03 NOTE — Progress Notes (Signed)
[facility]  [address], [address] [place], [place] [address] Phone: [phone]  Fax: [phone]   Telephone Office Visit:  12/03/2018  I connected with Ronnie Keith on 12/03/18 at 2:42 PM EDT by telephone and verified that I was speaking with the correct person using 2 identifiers.  The patient was at home.  I discussed the limitations, risk, security and privacy concerns of performing an evaluation and management service by telephone and  the availability of in person appointments.  I also discussed with the patient that there may be a patient responsible charge related to this service.  The patient expressed understanding and agreed to proceed.    Chief Complaint: Ronnie Keith is a 58 y.o. male with end stage renal disease on dialysis, hemochromatosis, and anemia of chronic renal disease who is evaluated for 3 month assessment.  HPI: The patient was last seen in the hematology clinic on 09/09/2017.   At that time, he felt "ok".  He was on dialysis 3 days/week.  Exam remained stable.  CBC revealed a hematocrit of 33.2, hemoglobin 11.0, platelets 95,000, WBC 3700 with an ANC of 2000.  SPEP was normal.  Kappa free light chains were 129.9, lambda free light chains 103.5 and ratio 1.26 (normal).  AFP was < 0.9.  Iron saturation was 35% and a TIBC of 260.  Ferritin was 167 (improved).  During the interim, he has felt "okay".  He denies any transfusions.  He notes that the blood in his dialysis tubing continues to be discarded.  He notes that he feels "out of energy" after dialysis.  He denies any shortness of breath or chest pain.  He notes that he has some bruises on his arms.  He is taking aspirin 81 mg a day as well as Tylenol.  Weight fluctuates between 205 and 210 pounds.  He states that his left hand is "not better".   Past Medical History:  Diagnosis Date  . Anxiety   . Chronic kidney disease   . Chronic low back pain   . Hypertension   . Sciatica   .  Scoliosis     Past Surgical History:  Procedure Laterality Date  . CLEFT PALATE REPAIR    . EYE SURGERY     "when I was small"  . PERIPHERAL VASCULAR CATHETERIZATION N/A 09/29/2015   Procedure: Dialysis/Perma Catheter Insertion;  Surgeon: Katha Cabal, MD;  Location: [facility];  Service: Cardiovascular;  Laterality: N/A;  . ROTATOR CUFF REPAIR Left     Family History  Problem Relation Age of Onset  . Hypertension Other     Social History:  reports that he has never smoked. He has never used smokeless tobacco. He reports that he does not drink alcohol or use drugs.  Patient is on disability. There have been no known exposures to radiation or toxins.  He lives in [place] with his sister and mother [name]).  He likes to bowl and go "people watching".  His sister has cerebral palsy.   Participants in the patient's visit included the patient and Waymon Budge, Therapist, sports.  The intake visit was provided by Waymon Budge, RN.   Allergies:  Allergies  Allergen Reactions  . Vancomycin Rash    Current Medications: Current Outpatient Medications  Medication Sig Dispense Refill  . acetaminophen (TYLENOL) 325 MG tablet Take 650 mg by mouth every 6 (six) hours as needed for mild pain or moderate pain.    Marland Kitchen albuterol (VENTOLIN  HFA) 108 (90 Base) MCG/ACT inhaler INHALE 2 PUFFS INTO LUNGS EVERY 6 HOURS AS NEEDED    . aspirin EC 81 MG tablet Take 81 mg by mouth daily.    Marland Kitchen buPROPion (WELLBUTRIN XL) 150 MG 24 hr tablet Take 1 tablet (150 mg total) by mouth daily. 30 tablet 1  . calcium acetate (PHOSLO) 667 MG capsule Take 2 capsules (1,334 mg total) by mouth 3 (three) times daily with meals. 180 capsule 1  . fluticasone (FLONASE) 50 MCG/ACT nasal spray Place 1 spray into both nostrils daily.    Marland Kitchen gabapentin (NEURONTIN) 100 MG capsule Take 1 capsule by mouth 2 (two) times daily.    Marland Kitchen lidocaine-prilocaine (EMLA) cream APPLY 1 A SMALL AMOUNT TO SKIN THREE TIMES A WEEK APPLY TO SKIN  OVER DIALYSIS ACCESS 30 2 MIN PRIOR TO TREATMENT COVER WITH SARAN WRAP    . MELATONIN PO Take 1 tablet by mouth daily.    . metoprolol succinate (TOPROL-XL) 50 MG 24 hr tablet Take 50 mg by mouth daily.    . Multiple Vitamins-Minerals (MULTIVITAMIN ADULT PO) Take by mouth daily.     Marland Kitchen omeprazole (PRILOSEC) 40 MG capsule Take 40 mg by mouth daily.    . polyethylene glycol powder (GLYCOLAX/MIRALAX) powder 1 cap full in a full glass of water, two times a day for 3 days. (Patient taking differently: daily as needed. 1 cap full in a full glass of water, two times a day for 3 days.) 255 g 0  . QUEtiapine (SEROQUEL) 200 MG tablet Take 1 tablet (200 mg total) by mouth at bedtime. 30 tablet 1  . rOPINIRole (REQUIP) 1 MG tablet TAKE 1 TABLET BY MOUTH IN THE MORNING    . rOPINIRole (REQUIP) 3 MG tablet TAKE 1 TABLET BY MOUTH AT BEDTIME AS DIRECTED    . senna-docusate (SENOKOT-S) 8.6-50 MG tablet Take 2 tablets by mouth 2 (two) times daily. (Patient taking differently: Take 1 tablet by mouth at bedtime. ) 60 tablet 2  . traMADol (ULTRAM) 50 MG tablet Take 1 tablet (50 mg total) by mouth every 6 (six) hours as needed for severe pain. 30 tablet 0   No current facility-administered medications for this visit.    Facility-Administered Medications Ordered in Other Visits  Medication Dose Route Frequency Provider Last Rate Last Dose  . sodium chloride flush (NS) 0.9 % injection 10 mL  10 mL Intravenous PRN Lequita Asal, MD   10 mL at 09/17/17 1000    Review of Systems  Constitutional: Negative.  Negative for chills, diaphoresis, fever, malaise/fatigue and weight loss (fluctuates 205-210 pounds).       Feels "ok".  HENT: Negative.  Negative for congestion, ear discharge, ear pain, nosebleeds, sinus pain and sore throat.   Eyes: Negative.  Negative for blurred vision, double vision, photophobia, pain and discharge.  Respiratory: Positive for shortness of breath (exertional). Negative for cough,  hemoptysis and sputum production.   Cardiovascular: Negative.  Negative for chest pain, palpitations, orthopnea, leg swelling and PND.       Takes midodrine pre-dialysis.  Gastrointestinal: Negative.  Negative for abdominal pain, blood in stool, constipation, diarrhea, heartburn, melena and nausea.  Genitourinary: Negative.  Negative for dysuria, frequency, hematuria and urgency.       Dialysis 3x/week.  Musculoskeletal: Negative.  Negative for back pain, falls, myalgias and neck pain.  Skin: Negative.  Negative for itching and rash.  Neurological: Positive for sensory change (left hand weakness persists after fistula placement). Negative for dizziness,  tingling, tremors, seizures, weakness and headaches.  Endo/Heme/Allergies: Negative.  Does not bruise/bleed easily (bruises on arms).  Psychiatric/Behavioral: Negative.  Negative for depression and memory loss. The patient is not nervous/anxious and does not have insomnia.   All other systems reviewed and are negative.  Performance status (ECOG): 2   Appointment on 12/02/2018  Component Date Value Ref Range Status  . AFP, Serum, Tumor Marker 12/02/2018 <0.9  0.0 - 8.3 ng/mL Final   Comment: (NOTE) Roche Diagnostics Electrochemiluminescence Immunoassay (ECLIA) Values obtained with different assay methods or kits cannot be used interchangeably.  Results cannot be interpreted as absolute evidence of the presence or absence of malignant disease. This test is not interpretable in pregnant females. Performed At: [facility] [place], [place] [id number] Rush Farmer MD [id number]   . Sed Rate 12/02/2018 14  0 - 16 mm/hr Final   Performed at [facility], [address] [place], [place] [address]  . Iron 12/02/2018 68  45 - 182 ug/dL Final  . TIBC 12/02/2018 274  250 - 450 ug/dL Final  . Saturation Ratios 12/02/2018 25  17.9 - 39.5 % Final  . UIBC 12/02/2018 206  ug/dL Final   Performed at [facility], [address], [place], [place] [address]  . Ferritin 12/02/2018 67  24 - 336 ng/mL Final   Performed at [facility], [address] [place], [place] [address]  . WBC 12/02/2018 3.7* 4.0 - 10.5 K/uL Final  . RBC 12/02/2018 3.36* 4.22 - 5.81 MIL/uL Final  . Hemoglobin 12/02/2018 11.0* 13.0 - 17.0 g/dL Final  . HCT 12/02/2018 33.9* 39.0 - 52.0 % Final  . MCV 12/02/2018 100.9* 80.0 - 100.0 fL Final  . MCH 12/02/2018 32.7  26.0 - 34.0 pg Final  . MCHC 12/02/2018 32.4  30.0 - 36.0 g/dL Final  . RDW 12/02/2018 14.5  11.5 - 15.5 % Final  . Platelets 12/02/2018 80* 150 - 400 K/uL Final   Comment: Immature Platelet Fraction may be clinically indicated, consider ordering this additional test [id number]   . nRBC 12/02/2018 0.0  0.0 - 0.2 % Final  . Neutrophils Relative % 12/02/2018 48  % Final  . Neutro Abs 12/02/2018 1.9  1.7 - 7.7 K/uL Final  . Lymphocytes Relative 12/02/2018 39  % Final  . Lymphs Abs 12/02/2018 1.4  0.7 - 4.0 K/uL Final  . Monocytes Relative 12/02/2018 8  % Final  . Monocytes Absolute 12/02/2018 0.3  0.1 - 1.0 K/uL Final  . Eosinophils Relative 12/02/2018 4  % Final  . Eosinophils Absolute 12/02/2018 0.1  0.0 - 0.5 K/uL Final  . Basophils Relative 12/02/2018 1  % Final  . Basophils Absolute 12/02/2018 0.0  0.0 - 0.1 K/uL Final  . Immature Granulocytes 12/02/2018 0  % Final  . Abs Immature Granulocytes 12/02/2018 0.01  0.00 - 0.07 K/uL Final   Performed at [facility], [address], [place], [place] [address]    Assessment:  Ronnie Keith is a 58 y.o. male with hemochromatosis.  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  He presented with an elevated iron saturation (47%) and ferritin.  His mother has hemochromatosis.  Work-up on 07/30/2017 revealed two mutations (C282Y and H63D).  Iron saturation was 46% with a TIBC of 202.  Ferritin was 1143.  CRP was < 0.8.  Sed rate was 31 (0-20).  B12 was 448.  Folate was 13.1.  CBC  revealed a hematocrit of  34.1, hemoglobin 11.3, MCV 99.7, platelets 107,000, WBC 4100 with an ANC of 2300.  Ferritin has been followed: 680 on 06/18/2017, 1143 on 07/30/2017, 636 on 09/17/2017, 537 on 11/19/2017, 640 on 12/17/2017, 531 on 01/28/2018, 450 on 02/11/2018, 416 on 02/25/2018, 260 on 05/20/2018, 167 on 09/09/2018, and 67 on 12/02/2018.  Ferritin goal is < 50.  Work-up of mild pancytopenia on 05/20/2018 included the following negative studies:  ANA, hepatitis B surface antigen hepatitis B core antibody, hepatitis C antibody, and HIV testing.  Retic was 0.8.  He has renal failure and has been on dialysis x 2 years.  He has associated anemia of chronic renal disease.  He receives Procrit.  RUQ ultrasound on 09/22/2017 a normal hepatic echotexture.  There was no mass.  Abdominal ultrasound on 06/03/2018 revealed diffuse hepatic steatosis and/or hepatocellular disease without focal hepatic parenchymal abnormality.  There was no sonographic evidence of hepatic cirrhosis. Patent portal vein with normal hepatopetal flow.  Spleen was upper normal in size and normal in appearance (unchanged).  AFP has been followed: 1.2 on 09/17/2017, 0.8 on 02/11/2018, and < 0.9 on 09/09/2018.  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  Colonoscopy on 09/08/2017 at [facility] revealed an 8 mm polyp in the ascending colon and 3 mm polyp at the hepatic flexure, removed with a cold snare.  There were internal hemorrhoids. Pathology revealed sessile serrated adenomas that were negative for conventional dysplasia  He is handicapped and is on disability.  Symptomatically, he feels "ok".  He denies any bleeding or interval infections.  Plan: 1. Review labs from 12/02/2018. 2. Hemochromatosis  Ferritin is 67.  Ferritin goal < 50.  Patient notes ongoing small volume phlebotomies in dialysis (blood in tubing).  Hold on small volume phlebotomies for now.  Continue monitoring.   Discuss AFP and RUQ ultrasound every 6 months for surveillance. 3. Mild pancytopenia  Etiology remains unclear.  Patient denies any new medications or herbal products.  Patient receiving Procrit with dialysis.    Platelet count has been between 78,000 - 122,000 in the past 2 years.  Discuss additional testing at next lab check:  copper level, flow cytometry, HIT assay. 4.   Reschedule abdominal ultrasound in 3 months (per patient request). 5.   RTC after ultrasound for MD assessment and labs (CBC with diff, copper level, flow cytometry, HIT assay, serotonin release assay).   I discussed the assessment and treatment plan with the patient.  The patient was provided an opportunity to ask questions and all were answered.  The patient agreed with the plan and demonstrated an understanding of the instructions.  The patient was advised to call back or seek an in person evaluation if the symptoms worsen or if the condition fails to improve as anticipated.  I provided 19 minutes (2:42 PM - 3:01 PM) of non-face-to-face time during this encounter.  I provided these services from the [facility] office.   Lequita Asal, MD, PhD  12/03/2018, 2:42 PM

## 2018-12-06 ENCOUNTER — Encounter: Payer: Self-pay | Admitting: Hematology and Oncology

## 2019-10-09 ENCOUNTER — Encounter: Payer: Self-pay | Admitting: Intensive Care

## 2019-10-09 ENCOUNTER — Emergency Department
Admission: EM | Admit: 2019-10-09 | Discharge: 2019-10-09 | Disposition: A | Discharge status: Home / Self Care | Payer: Medicare Other | Attending: Emergency Medicine | Admitting: Emergency Medicine

## 2019-10-09 ENCOUNTER — Other Ambulatory Visit: Payer: Self-pay

## 2019-10-09 DIAGNOSIS — Z992 Dependence on renal dialysis: Secondary | ICD-10-CM | POA: Diagnosis not present

## 2019-10-09 DIAGNOSIS — Z79899 Other long term (current) drug therapy: Secondary | ICD-10-CM | POA: Insufficient documentation

## 2019-10-09 DIAGNOSIS — Z20822 Contact with and (suspected) exposure to covid-19: Secondary | ICD-10-CM | POA: Diagnosis not present

## 2019-10-09 DIAGNOSIS — Z7982 Long term (current) use of aspirin: Secondary | ICD-10-CM | POA: Diagnosis not present

## 2019-10-09 DIAGNOSIS — N186 End stage renal disease: Secondary | ICD-10-CM | POA: Diagnosis not present

## 2019-10-09 DIAGNOSIS — Y712 Prosthetic and other implants, materials and accessory cardiovascular devices associated with adverse incidents: Secondary | ICD-10-CM | POA: Diagnosis not present

## 2019-10-09 DIAGNOSIS — T8249XA Other complication of vascular dialysis catheter, initial encounter: Secondary | ICD-10-CM | POA: Diagnosis not present

## 2019-10-09 DIAGNOSIS — I120 Hypertensive chronic kidney disease with stage 5 chronic kidney disease or end stage renal disease: Secondary | ICD-10-CM | POA: Insufficient documentation

## 2019-10-09 LAB — COMPREHENSIVE METABOLIC PANEL
ALT: 13 U/L (ref 0–44)
AST: 13 U/L — ABNORMAL LOW (ref 15–41)
Albumin: 3.9 g/dL (ref 3.5–5.0)
Alkaline Phosphatase: 114 U/L (ref 38–126)
Anion gap: 11 (ref 5–15)
BUN: 46 mg/dL — ABNORMAL HIGH (ref 6–20)
CO2: 28 mmol/L (ref 22–32)
Calcium: 8.3 mg/dL — ABNORMAL LOW (ref 8.9–10.3)
Chloride: 100 mmol/L (ref 98–111)
Creatinine, Ser: 7.86 mg/dL — ABNORMAL HIGH (ref 0.61–1.24)
GFR calc Af Amer: 8 mL/min — ABNORMAL LOW (ref >60)
GFR calc non Af Amer: 7 mL/min — ABNORMAL LOW (ref >60)
Glucose, Bld: 91 mg/dL (ref 70–99)
Potassium: 4.6 mmol/L (ref 3.5–5.1)
Sodium: 139 mmol/L (ref 135–145)
Total Bilirubin: 0.6 mg/dL (ref 0.3–1.2)
Total Protein: 7 g/dL (ref 6.5–8.1)

## 2019-10-09 LAB — CBC WITH DIFFERENTIAL/PLATELET
Abs Immature Granulocytes: 0.01 10*3/uL (ref 0.00–0.07)
Basophils Absolute: 0 10*3/uL (ref 0.0–0.1)
Basophils Relative: 1 %
Eosinophils Absolute: 0.1 10*3/uL (ref 0.0–0.5)
Eosinophils Relative: 1 %
HCT: 30.4 % — ABNORMAL LOW (ref 39.0–52.0)
Hemoglobin: 9.8 g/dL — ABNORMAL LOW (ref 13.0–17.0)
Immature Granulocytes: 0 %
Lymphocytes Relative: 19 %
Lymphs Abs: 0.8 10*3/uL (ref 0.7–4.0)
MCH: 31.1 pg (ref 26.0–34.0)
MCHC: 32.2 g/dL (ref 30.0–36.0)
MCV: 96.5 fL (ref 80.0–100.0)
Monocytes Absolute: 0.4 10*3/uL (ref 0.1–1.0)
Monocytes Relative: 8 %
Neutro Abs: 2.9 10*3/uL (ref 1.7–7.7)
Neutrophils Relative %: 71 %
Platelets: 71 10*3/uL — ABNORMAL LOW (ref 150–400)
RBC: 3.15 MIL/uL — ABNORMAL LOW (ref 4.22–5.81)
RDW: 13.7 % (ref 11.5–15.5)
WBC: 4.2 10*3/uL (ref 4.0–10.5)
nRBC: 0 % (ref 0.0–0.2)

## 2019-10-09 MED ORDER — SODIUM ZIRCONIUM CYCLOSILICATE 10 G PO PACK
10.0000 g | PACK | ORAL | Status: AC
  Administered 2019-10-09: 17:00:00 10 g via ORAL
  Filled 2019-10-09: qty 1

## 2019-10-09 NOTE — ED Triage Notes (Addendum)
Dialysis patient Tuesday,Thursday, and Saturday. Patient went today for dialysis and his access on left arm is clotted. Patient was told to come to ER to declot. Last had dialysis Thursday. No pain. No SOB or CP

## 2019-10-09 NOTE — ED Notes (Signed)
Pt alert and able to move freely, no apparent distress. Pt states his fistula wasn't able to be accessed at dialysis. Pt given warm blanket.

## 2019-10-09 NOTE — ED Notes (Signed)
Pt wheeled to lobby, pt states mother is waiting in car and does not have phone. Pt walking, gait steady, no dizziness. Pt states he has limp but walks fine. Pt dcd to home.

## 2019-10-09 NOTE — ED Provider Notes (Signed)
[facility] Emergency Department Provider Note  ____________________________________________  Time seen: Approximately 4:27 PM  I have reviewed the triage vital signs and the nursing notes.   HISTORY  Chief Complaint Vascular Access Problem    HPI Ronnie Keith is a 59 y.o. male with a history of ESRD on hemodialysis Tuesday Thursday Saturday and hypertension  who went to his routine dialysis today but they were unable to perform HD today, suspecting that his access was clotted.  He was instructed to come to the ED today for evaluation.  Patient denies any acute symptoms such as chest pain belly pain back pain shortness of breath palpitations dizziness passing out or generalized weakness.  He feels like he is in his normal state of health.     Past Medical History:  Diagnosis Date  . Anxiety   . Chronic kidney disease   . Chronic low back pain   . Hypertension   . Sciatica   . Scoliosis      Patient Active Problem List   Diagnosis Date Noted  . Anemia due to chronic kidney disease, on chronic dialysis ([place]) 06/07/2018  . Leukopenia 06/07/2018  . Thrombocytopenia ([place]) 06/07/2018  . 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 Elevated ferritin 07/30/2017  . Macrocytic anemia 07/30/2017  . Chronic kidney disease 07/17/2016  . Intellectual disability 07/16/2016  . Major depressive disorder, recurrent severe without psychotic features ([place]) 09/28/2015  . Hyperkalemia 09/20/2015     Past Surgical History:  Procedure Laterality Date  . CLEFT PALATE REPAIR    . EYE SURGERY     "when I was small"  . PERIPHERAL VASCULAR CATHETERIZATION N/A 09/29/2015   Procedure: Dialysis/Perma Catheter Insertion;  Surgeon: Katha Cabal, MD;  Location: [facility];  Service: Cardiovascular;  Laterality: N/A;  . ROTATOR CUFF REPAIR Left      Prior to Admission medications   Medication Sig Start Date  End Date Taking? Authorizing Provider  acetaminophen (TYLENOL) 325 MG tablet Take 650 mg by mouth every 6 (six) hours as needed for mild pain or moderate pain.    [provider]  albuterol (VENTOLIN HFA) 108 (90 Base) MCG/ACT inhaler INHALE 2 PUFFS INTO LUNGS EVERY 6 HOURS AS NEEDED 08/11/18   [provider]  aspirin EC 81 MG tablet Take 81 mg by mouth daily.    [provider]  buPROPion (WELLBUTRIN XL) 150 MG 24 hr tablet Take 1 tablet (150 mg total) by mouth daily. 07/30/16   Pucilowska, Herma Ard B, MD  calcium acetate (PHOSLO) 667 MG capsule Take 2 capsules (1,334 mg total) by mouth 3 (three) times daily with meals. 07/29/16   Pucilowska, Jolanta B, MD  fluticasone (FLONASE) 50 MCG/ACT nasal spray Place 1 spray into both nostrils daily.    [provider]  gabapentin (NEURONTIN) 100 MG capsule Take 1 capsule by mouth 2 (two) times daily. 06/12/18   [provider]  lidocaine-prilocaine (EMLA) cream APPLY 1 A SMALL AMOUNT TO SKIN THREE TIMES A WEEK APPLY TO SKIN OVER DIALYSIS ACCESS 30 60 MIN PRIOR TO TREATMENT COVER WITH SARAN WRAP 09/03/18   [provider]  MELATONIN PO Take 1 tablet by mouth daily.    [provider]  metoprolol succinate (TOPROL-XL) 50 MG 24 hr tablet Take 50 mg by mouth daily. 03/28/16 05/06/21  [provider]  Multiple Vitamins-Minerals (MULTIVITAMIN ADULT PO) Take by mouth daily.     [provider]  omeprazole (PRILOSEC) 40 MG capsule Take 40 mg by mouth daily.    [provider]  polyethylene glycol powder (GLYCOLAX/MIRALAX) powder 1 cap full in a full glass of water, two times a day for 3 days. Patient taking differently: daily as needed. 1 cap full in a full glass of water, two times a day for 3 days. 03/26/18   Carrie Mew, MD  QUEtiapine (SEROQUEL) 200 MG tablet Take 1 tablet (200 mg total) by mouth at bedtime. 07/29/16   Pucilowska, Jolanta B, MD  rOPINIRole (REQUIP) 1 MG  tablet TAKE 1 TABLET BY MOUTH IN THE MORNING 09/03/18   [provider]  rOPINIRole (REQUIP) 3 MG tablet TAKE 1 TABLET BY MOUTH AT BEDTIME AS DIRECTED 05/04/18   [provider]  senna-docusate (SENOKOT-S) 8.6-50 MG tablet Take 2 tablets by mouth 2 (two) times daily. Patient taking differently: Take 1 tablet by mouth at bedtime.  10/07/15   Gladstone Lighter, MD  traMADol (ULTRAM) 50 MG tablet Take 1 tablet (50 mg total) by mouth every 6 (six) hours as needed for severe pain. 07/29/16   Pucilowska, Wardell Honour, MD     Allergies Vancomycin   Family History  Problem Relation Age of Onset  . Hypertension Other     Social History Social History   Tobacco Use  . Smoking status: Never Smoker  . Smokeless tobacco: Never Used  Substance Use Topics  . Alcohol use: No  . Drug use: No    Review of Systems  Constitutional:   No fever or chills.  ENT:   No sore throat. No rhinorrhea. Cardiovascular:   No chest pain or syncope. Respiratory:   No dyspnea or cough. Gastrointestinal:   Negative for abdominal pain, vomiting and diarrhea.  Musculoskeletal:   Negative for focal pain or swelling All other systems reviewed and are negative except as documented above in ROS and HPI.  ____________________________________________   PHYSICAL EXAM:  VITAL SIGNS: ED Triage Vitals  Enc Vitals Group     BP 10/09/19 1324 138/81     Pulse Rate 10/09/19 1324 74     Resp 10/09/19 1324 16     Temp 10/09/19 1324 98.4 F (36.9 C)     Temp Source 10/09/19 1324 Oral     SpO2 10/09/19 1324 100 %     Weight 10/09/19 1325 205 lb (93 kg)     Height 10/09/19 1325 5\' 10"  (1.778 m)     Head Circumference --      Peak Flow --      Pain Score 10/09/19 1325 0     Pain Loc --      Pain Edu? --      Excl. in [place]? --     Vital signs reviewed, nursing assessments reviewed.   Constitutional:   Alert and oriented. Non-toxic appearance. Eyes:   Conjunctivae are normal. EOMI. PERRL. ENT       Head:   Normocephalic and atraumatic.      Nose:   Wearing a mask.      Mouth/Throat:   Wearing a mask.      Neck:   No meningismus. Full ROM. Hematological/Lymphatic/Immunilogical:   No cervical lymphadenopathy. Cardiovascular:   RRR.  Palpable bilateral radial pulses.  No murmurs. Cap refill less than 2 seconds.  Left upper extremity AV graft nontender, no swelling, palpable pulse without thrill. Respiratory:   Normal respiratory effort without tachypnea/retractions. Breath sounds are clear and equal bilaterally. No wheezes/rales/rhonchi. Gastrointestinal:   Soft and  nontender. Non distended. There is no CVA tenderness.  No rebound, rigidity, or guarding. Musculoskeletal:   Normal range of motion in all extremities. No joint effusions.  No lower extremity tenderness.  No edema. Neurologic:   Normal speech and language.  Motor grossly intact. No acute focal neurologic deficits are appreciated.  Skin:    Skin is warm, dry and intact. No rash noted.  No petechiae, purpura, or bullae.  ____________________________________________    LABS (pertinent positives/negatives) (all labs ordered are listed, but only abnormal results are displayed) Labs Reviewed  CBC WITH DIFFERENTIAL/PLATELET - Abnormal; Notable for the following components:      Result Value   RBC 3.15 (*)    Hemoglobin 9.8 (*)    HCT 30.4 (*)    Platelets 71 (*)    All other components within normal limits  COMPREHENSIVE METABOLIC PANEL - Abnormal; Notable for the following components:   BUN 46 (*)    Creatinine, Ser 7.86 (*)    Calcium 8.3 (*)    AST 13 (*)    GFR calc non Af Amer 7 (*)    GFR calc Af Amer 8 (*)    All other components within normal limits  SARS CORONAVIRUS 2 (TAT 6-24 HRS)   ____________________________________________   EKG    ____________________________________________    RADIOLOGY  No results  found.  ____________________________________________   PROCEDURES Procedures  ____________________________________________    CLINICAL IMPRESSION / ASSESSMENT AND PLAN / ED COURSE  Medications ordered in the ED: Medications  sodium zirconium cyclosilicate (LOKELMA) packet 10 g (has no administration in time range)    Pertinent labs & imaging results that were available during my care of the patient were reviewed by me and considered in my medical decision making (see chart for details).  Ronnie Keith was evaluated in Emergency Department on 10/09/2019 for the symptoms described in the history of present illness. He was evaluated in the context of the global COVID-19 pandemic, which necessitated consideration that the patient might be at risk for infection with the SARS-CoV-2 virus that causes COVID-19. Institutional protocols and algorithms that pertain to the evaluation of patients at risk for COVID-19 are in a state of rapid change based on information released by regulatory bodies including the CDC and federal and state organizations. These policies and algorithms were followed during the patient's care in the ED.   Patient presents with dialysis access dysfunction.  Vital signs unremarkable, no acute symptoms.  Labs unremarkable with BUN and K essentially normal.  Case discussed with vascular surgery Dr. Trula Slade who notes they are not able to do declotting procedures on the weekend.  Patient has no acute needs at this time such as urgent dialysis or telemetry monitoring, so he advises patient could be discharged home with instructions to return to short stay at 8:00 AM in 2 days on Monday for salvage procedure on his graft.  I will give a dose of Lokelma, send a Covid swab for screening.      ____________________________________________   FINAL CLINICAL IMPRESSION(S) / ED DIAGNOSES    Final diagnoses:  Clotted dialysis access, initial encounter [facility])  End-stage renal disease on  hemodialysis   ED Discharge Orders    None      Portions of this note were generated with dragon dictation software. Dictation errors may occur despite best attempts at proofreading.   Carrie Mew, MD 10/09/19 [id number]

## 2019-10-09 NOTE — Discharge Instructions (Addendum)
You were evaluated in the ER today due to dysfunction of your dialysis access.  Your vital signs, EKG, and labs were all okay.  We discussed the issue with the Vascular Surgeon, who advises that they can not address this problem on the weekend.  They recommend coming back to the Short Stay admitting desk at [facility] on Monday Feb. 8 at 8:00am.  They will arrange a procedure to de-clot your dialysis access at that time.    You should return to the ER before then if you have new symptoms such as chest pain, palpitations, shortness of breath, weakness, passing out, or other new concerns.

## 2019-10-10 LAB — SARS CORONAVIRUS 2 (TAT 6-24 HRS): SARS Coronavirus 2: NEGATIVE

## 2019-12-07 ENCOUNTER — Telehealth (INDEPENDENT_AMBULATORY_CARE_PROVIDER_SITE_OTHER): Payer: Self-pay

## 2019-12-07 NOTE — Telephone Encounter (Signed)
I returned the pts call about his arm hurting and the pt made me aware that his entire left arm from the shoulder to his fingers is swollen when I inquired further into the situation I found per the pt that his left arm is also a little red an itchy  and is not warm to the touch. Please advise

## 2019-12-07 NOTE — Telephone Encounter (Signed)
We have never seen this patient in office and the last time he was seen in the hospital by our practice was in 2017.  Based on this he would be a new patient.  He will need to have his dialysis center send in a referral and then we can get him scheduled to be seen.

## 2019-12-08 NOTE — Telephone Encounter (Signed)
I called and made the pt aware of the above.

## 2019-12-23 ENCOUNTER — Other Ambulatory Visit (INDEPENDENT_AMBULATORY_CARE_PROVIDER_SITE_OTHER): Payer: Self-pay | Admitting: Vascular Surgery

## 2019-12-23 DIAGNOSIS — N186 End stage renal disease: Secondary | ICD-10-CM

## 2019-12-23 DIAGNOSIS — M7989 Other specified soft tissue disorders: Secondary | ICD-10-CM

## 2019-12-24 ENCOUNTER — Encounter (INDEPENDENT_AMBULATORY_CARE_PROVIDER_SITE_OTHER): Payer: Self-pay | Admitting: Vascular Surgery

## 2019-12-24 ENCOUNTER — Ambulatory Visit (INDEPENDENT_AMBULATORY_CARE_PROVIDER_SITE_OTHER): Payer: Medicare Other | Admitting: Vascular Surgery

## 2019-12-24 ENCOUNTER — Ambulatory Visit (INDEPENDENT_AMBULATORY_CARE_PROVIDER_SITE_OTHER): Payer: Medicare Other

## 2019-12-24 ENCOUNTER — Other Ambulatory Visit: Payer: Self-pay

## 2019-12-24 DIAGNOSIS — M7989 Other specified soft tissue disorders: Secondary | ICD-10-CM

## 2019-12-24 DIAGNOSIS — N186 End stage renal disease: Secondary | ICD-10-CM

## 2019-12-24 DIAGNOSIS — I10 Essential (primary) hypertension: Secondary | ICD-10-CM | POA: Diagnosis not present

## 2019-12-24 DIAGNOSIS — Z992 Dependence on renal dialysis: Secondary | ICD-10-CM

## 2019-12-24 NOTE — Progress Notes (Signed)
Patient ID: Ronnie Keith, male   DOB: 1961/05/07, 59 y.o.   MRN: [id number]  Chief Complaint  Patient presents with  . New Patient (Initial Visit)    ref Lateef left arm swelling    HPI Ronnie Keith is a 59 y.o. male.  I am asked to see the patient by Dr. Holley Raring for evaluation of arm swelling ipsilateral to his left arm AVG. His mom accompanies him today and helps provide the history. This AV graft was placed about a year ago.  He had a fistula on that side that would never work for dialysis.  We had not seen the patient in about 4 years when a PermCath was placed for his initial initiation of dialysis in early 2017.  He says they do have difficult time with prolonged bleeding having to hold pressure for 15 to 20 minutes in the left arm.  The swelling is not too bad today but about a month ago was quite severe.  He says that this did correlate with some bruising in his arm and difficulties with dialysis.  He does notice some weakness in his hand that seems to be better now that the swelling is improved.  No open wounds or infection.  No fever or chills To evaluate the situation, a dialysis duplex was performed today.  The visualized portions of the left brachial artery to axillary vein AV graft were widely patent without focal stenosis, but no evaluation of the central venous circulation can be reliably given by duplex.     Past Medical History:  Diagnosis Date  . Anxiety   . Chronic kidney disease   . Chronic low back pain   . Hypertension   . Sciatica   . Scoliosis   ESRD  Past Surgical History:  Procedure Laterality Date  . CLEFT PALATE REPAIR    . EYE SURGERY     "when I was small"  . PERIPHERAL VASCULAR CATHETERIZATION N/A 09/29/2015   Procedure: Dialysis/Perma Catheter Insertion;  Surgeon: Katha Cabal, MD;  Location: [facility];  Service: Cardiovascular;  Laterality: N/A;  . 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 Hypertension Other      Social History   Tobacco Use  . Smoking status: Never Smoker  . Smokeless tobacco: Never Used  Substance Use Topics  . Alcohol use: No  . Drug use: No     Allergies  Allergen Reactions  . Vancomycin Rash    Current Outpatient Medications  Medication Sig Dispense Refill  . acetaminophen (TYLENOL) 325 MG tablet Take 650 mg by mouth every 6 (six) hours as needed for mild pain or moderate pain.    Marland Kitchen albuterol (VENTOLIN HFA) 108 (90 Base) MCG/ACT inhaler INHALE 2 PUFFS INTO LUNGS EVERY 6 HOURS AS NEEDED    . aspirin EC 81 MG tablet Take 81 mg by mouth daily.    Marland Kitchen buPROPion (WELLBUTRIN XL) 150 MG 24 hr tablet Take 1 tablet (150 mg total) by mouth daily. 30 tablet 1  . calcium acetate (PHOSLO) 667 MG capsule Take 2 capsules (1,334 mg total) by mouth 3 (three) times daily with meals. 180 capsule 1  . fluticasone (FLONASE) 50 MCG/ACT nasal spray Place 1 spray into both nostrils daily.    Marland Kitchen gabapentin (NEURONTIN) 100 MG capsule Take 1 capsule by mouth 2 (two) times daily.    Marland Kitchen  lidocaine-prilocaine (EMLA) cream APPLY 1 A SMALL AMOUNT TO SKIN THREE TIMES A WEEK APPLY TO SKIN OVER DIALYSIS ACCESS 30 67 MIN PRIOR TO TREATMENT COVER WITH SARAN WRAP    . MELATONIN PO Take 1 tablet by mouth daily.    . metoprolol succinate (TOPROL-XL) 50 MG 24 hr tablet Take 50 mg by mouth daily.    . Multiple Vitamins-Minerals (MULTIVITAMIN ADULT PO) Take by mouth daily.     Marland Kitchen omeprazole (PRILOSEC) 40 MG capsule Take 40 mg by mouth daily.    . polyethylene glycol powder (GLYCOLAX/MIRALAX) powder 1 cap full in a full glass of water, two times a day for 3 days. (Patient taking differently: daily as needed. 1 cap full in a full glass of water, two times a day for 3 days.) 255 g 0  . QUEtiapine (SEROQUEL) 200 MG tablet Take 1 tablet (200 mg total) by mouth at bedtime. 30 tablet 1  . rOPINIRole (REQUIP) 1 MG tablet TAKE 1 TABLET BY  MOUTH IN THE MORNING    . rOPINIRole (REQUIP) 3 MG tablet TAKE 1 TABLET BY MOUTH AT BEDTIME AS DIRECTED    . senna-docusate (SENOKOT-S) 8.6-50 MG tablet Take 2 tablets by mouth 2 (two) times daily. (Patient taking differently: Take 1 tablet by mouth at bedtime. ) 60 tablet 2  . traMADol (ULTRAM) 50 MG tablet Take 1 tablet (50 mg total) by mouth every 6 (six) hours as needed for severe pain. 30 tablet 0   No current facility-administered medications for this visit.   Facility-Administered Medications Ordered in Other Visits  Medication Dose Route Frequency Provider Last Rate Last Admin  . sodium chloride flush (NS) 0.9 % injection 10 mL  10 mL Intravenous PRN Lequita Asal, MD   10 mL at 09/17/17 1000      REVIEW OF SYSTEMS (Negative unless checked)  Constitutional: [] Weight loss  [] Fever  [] Chills Cardiac: [] Chest pain   [] Chest pressure   [] Palpitations   [] Shortness of breath when laying flat   [] Shortness of breath at rest   [] Shortness of breath with exertion. Vascular:  [] Pain in legs with walking   [] Pain in legs at rest   [] Pain in legs when laying flat   [] Claudication   [] Pain in feet when walking  [] Pain in feet at rest  [] Pain in feet when laying flat   [] History of DVT   [] Phlebitis   [x] Swelling in legs   [] Varicose veins   [] Non-healing ulcers Pulmonary:   [] Uses home oxygen   [] Productive cough   [] Hemoptysis   [] Wheeze  [] COPD   [] Asthma Neurologic:  [] Dizziness  [] Blackouts   [] Seizures   [] History of stroke   [] History of TIA  [] Aphasia   [] Temporary blindness   [] Dysphagia   [] Weakness or numbness in arms   [] Weakness or numbness in legs Musculoskeletal:  [] Arthritis   [] Joint swelling   [] Joint pain   [] Low back pain Hematologic:  [] Easy bruising  [] Easy bleeding   [] Hypercoagulable state   [x] Anemic  [] Hepatitis Gastrointestinal:  [] Blood in stool   [] Vomiting blood  [] Gastroesophageal reflux/heartburn   [] Abdominal pain Genitourinary:  [x] Chronic kidney disease    [] Difficult urination  [] Frequent urination  [] Burning with urination   [] Hematuria Skin:  [] Rashes   [] Ulcers   [] Wounds Psychological:  [x] History of anxiety   [x]  History of major depression.    Physical Exam BP 116/68 (BP Location: Right Arm)   Pulse 71   Resp 15   Ht 5\' 10"  (1.778 m)  Wt 210 lb (95.3 kg)   BMI 30.13 kg/m  Gen:  WD/WN, NAD Head: [place]/AT, No temporalis wasting.  Ear/Nose/Throat: Hearing grossly intact, nares w/o erythema or drainage, oropharynx w/o Erythema/Exudate Eyes: Conjunctiva clear, sclera non-icteric  Neck: trachea midline.  No JVD.  Pulmonary:  Good air movement, respirations not labored, no use of accessory muscles  Cardiac: RRR, no JVD Vascular: 1+ left upper extremity edema.  The left brachial artery to axillary vein AV graft has a good thrill.  The antecubital portion of what appears to be a brachiocephalic AV fistula is pulsatile and does have some flow in it although it does not continue up the arm. Vessel Right Left  Radial Palpable Palpable                                    Musculoskeletal: M/S 5/5 throughout.  Extremities without ischemic changes.  No deformity or atrophy.  1+ left upper extremity edema. Neurologic: Sensation grossly intact in extremities.  Symmetrical. Motor exam as listed above. Psychiatric: Judgment and insight appear to be pretty good.  He is a fair historian Dermatologic: No rashes or ulcers noted.  No cellulitis or open wounds.    Radiology No results found.  Labs Recent Results (from the past 2160 hour(s))  CBC with Differential     Status: Abnormal   Collection Time: 10/09/19  1:29 PM  Result Value Ref Range   WBC 4.2 4.0 - 10.5 K/uL   RBC 3.15 (L) 4.22 - 5.81 MIL/uL   Hemoglobin 9.8 (L) 13.0 - 17.0 g/dL   HCT 30.4 (L) 39.0 - 52.0 %   MCV 96.5 80.0 - 100.0 fL   MCH 31.1 26.0 - 34.0 pg   MCHC 32.2 30.0 - 36.0 g/dL   RDW 13.7 11.5 - 15.5 %   Platelets 71 (L) 150 - 400 K/uL    Comment: Immature  Platelet Fraction may be clinically indicated, consider ordering this additional test [id number]    nRBC 0.0 0.0 - 0.2 %   Neutrophils Relative % 71 %   Neutro Abs 2.9 1.7 - 7.7 K/uL   Lymphocytes Relative 19 %   Lymphs Abs 0.8 0.7 - 4.0 K/uL   Monocytes Relative 8 %   Monocytes Absolute 0.4 0.1 - 1.0 K/uL   Eosinophils Relative 1 %   Eosinophils Absolute 0.1 0.0 - 0.5 K/uL   Basophils Relative 1 %   Basophils Absolute 0.0 0.0 - 0.1 K/uL   Immature Granulocytes 0 %   Abs Immature Granulocytes 0.01 0.00 - 0.07 K/uL    Comment: Performed at [facility], [address] [place], [place] [address]  Comprehensive metabolic panel     Status: Abnormal   Collection Time: 10/09/19  1:29 PM  Result Value Ref Range   Sodium 139 135 - 145 mmol/L   Potassium 4.6 3.5 - 5.1 mmol/L   Chloride 100 98 - 111 mmol/L   CO2 28 22 - 32 mmol/L   Glucose, Bld 91 70 - 99 mg/dL   BUN 46 (H) 6 - 20 mg/dL   Creatinine, Ser 7.86 (H) 0.61 - 1.24 mg/dL   Calcium 8.3 (L) 8.9 - 10.3 mg/dL   Total Protein 7.0 6.5 - 8.1 g/dL   Albumin 3.9 3.5 - 5.0 g/dL   AST 13 (L) 15 - 41 U/L   ALT 13 0 - 44 U/L   Alkaline Phosphatase 114 38 - 126  U/L   Total Bilirubin 0.6 0.3 - 1.2 mg/dL   GFR calc non Af Amer 7 (L) >60 mL/min   GFR calc Af Amer 8 (L) >60 mL/min   Anion gap 11 5 - 15    Comment: Performed at [facility], [place], [place] [address]  SARS CORONAVIRUS 2 (TAT 6-24 HRS) Nasopharyngeal Nasopharyngeal Swab     Status: None   Collection Time: 10/09/19  4:34 PM   Specimen: Nasopharyngeal Swab  Result Value Ref Range   SARS Coronavirus 2 NEGATIVE NEGATIVE    Comment: (NOTE) SARS-CoV-2 target nucleic acids are NOT DETECTED. The SARS-CoV-2 RNA is generally detectable in upper and lower respiratory specimens during the acute phase of infection. Negative results do not preclude SARS-CoV-2 infection, do not rule out co-infections with other pathogens, and should not be used as  the sole basis for treatment or other patient management decisions. Negative results must be combined with clinical observations, patient history, and epidemiological information. The expected result is Negative. Fact Sheet for Patients: SugarRoll.be Fact Sheet for Healthcare Providers: https://www.woods-mathews.com/ This test is not yet approved or cleared by the [place] FDA and  has been authorized for detection and/or diagnosis of SARS-CoV-2 by FDA under an Emergency Use Authorization (EUA). This EUA will remain  in effect (meaning this test can be used) for the duration of the COVID-19 declaration under Section 56 4(b)(1) of the Act, 21 U.S.C. section 360bbb-3(b)(1), unless the authorization is terminated or revoked sooner. Performed at [facility], [place] [address] [place],  [address]     Assessment/Plan:  Essential hypertension blood pressure control important in reducing the progression of atherosclerotic disease. On appropriate oral medications.   ESRD on dialysis [facility]) a dialysis duplex was performed today.  The visualized portions of the left brachial artery to axillary vein AV graft were widely patent without focal stenosis, but no evaluation of the central venous circulation can be reliably given by duplex.  With signs of prolonged bleeding and arm swelling, I think a shuntogram with possible intervention would be a reasonable option.  Patient really does not want to have anything invasive done and asks if we can continue to monitor his arm saying that the swelling is getting better.  As long as he is getting adequate dialysis and his symptoms are tolerable, I am willing to continue conservative measures.  We agreed to return in about a month to discuss how his arm is doing and whether or not we should proceed with shuntogram at that point      Leotis Pain 12/24/2019, 1:20 PM   This note was created with Dragon  medical transcription system.  Any errors from dictation are unintentional.

## 2019-12-24 NOTE — Assessment & Plan Note (Signed)
blood pressure control important in reducing the progression of atherosclerotic disease. On appropriate oral medications.  

## 2019-12-24 NOTE — Patient Instructions (Signed)
Vascular Access for Hemodialysis        A vascular access is a connection to the blood inside your blood vessels that allows blood to be easily removed from your body and returned to your body during kidney dialysis (hemodialysis). Hemodialysis is a procedure in which a machine outside of the body filters the blood of a person whose kidneys are no longer working properly. There are three types of vascular accesses:  Arteriovenous fistula (AVF). This is a connection between an artery and a vein (usually in the arm) that is made by sewing them together. Blood in the artery flows directly into the vein, causing it to get larger over time. This makes it easier for the vein to be used for hemodialysis. An arteriovenous fistula takes 1-6 months to develop after surgery.  Arteriovenous graft (AVG). This is a connection between an artery and a vein in the arm that is made with a tube. An arteriovenous graft can be used within 2-3 weeks of surgery.  Venous catheter. This is a thin, flexible tube that is placed in a large vein (usually in the neck, chest, or groin). A venous catheter for hemodialysis contains two tubes that come out of the skin. A venous catheter can be used right away. It is usually used as a temporary access if you need hemodialysis before a fistula or graft has developed, or if kidney failure is sudden (acute) and likely to improve without the need for long-term dialysis. It may also be used as a permanent access if a fistula or graft cannot be created. Which type of access is best for me? The type of access that is best for you depends on the size and strength of your veins, your age, and any other health problems that you have, such as diabetes. An ultrasound test may be used to look at your veins to help make this decision. A fistula is usually the preferred type of access. It can last several years and is less likely than the other types of accesses to become infected or to cause a blood  clot within a blood vessel (thrombosis). However, a fistula is not an option for everyone. If your veins are not the right size or if the fistula does not develop properly, a graft may be used instead. Grafts require you to have strong veins. If your veins are not strong enough for a graft, a catheter may be used. Catheters are more likely than fistulas and grafts to become infected or to have a thrombosis. Sometimes, only one type of access is an option. Your health care provider will help you determine which type of access is best for you. How is a vascular access used? The way that the access is used depends on the type of access:  If the access is a fistula or graft, two needles are inserted through the skin into the access before each hemodialysis session. Blood leaves the body through one of the needles and travels through a tube to the hemodialysis machine (dialyzer). Then it flows through another tube and returns to the body through the second needle.  If the access is a catheter, one tube is connected directly to the tube that leads to the dialyzer, and the other tube is connected to a tube that leads away from the dialyzer. Blood leaves the body through one tube and returns to the body through the other. What problems can occur with vascular access?  A blood clot within a blood vessel (thrombosis).  Thrombosis can lead to a narrowing of a blood vessel (stenosis). If thrombosis occurs frequently, another access site may be created as a backup.  Infection.  Heart enlargement (cardiomegaly) and heart failure. Changes in blood flow may cause an increase in blood pressure or heart rate, making your heart work harder to pump blood. These problems are most likely to occur with a venous catheter and least likely to occur with an arteriovenous fistula. How do I care for my vascular access?  Wear a medical alert bracelet. In case of an emergency, this bracelet tells health care providers that you  are a dialysis patient and allows them to care for your veins appropriately. If you have a graft or fistula:  A "bruit" is a noise that is heard with a stethoscope, and a "thrill" is a vibration that is felt over the graft or fistula. The presence of the bruit and thrill indicates that the access is working. You will be taught to feel for the thrill each day. If this is not felt, the access may be clotted. Call your health care provider.  Keep your arm straight and raised (elevated) above your heart while the access site is healing.  You may freely use the arm where your vascular access is located after the site heals. Keep the following in mind: ? Avoid pressure on the arm. ? Avoid lifting heavy objects with the arm. ? Avoid sleeping on the arm. ? Avoid wearing tight-sleeved shirts or jewelry around the graft or fistula.  Do not allow blood pressure monitoring or needle punctures on the side where the graft or fistula is located.  With permission from your health care provider, you may do exercises to help with blood flow through a fistula. These exercises involve squeezing a rubber ball or other soft objects as instructed.  Wash your access site according to directions from your health care provider. If you have a venous catheter:  Keep the insertion site clean and dry at all times.  If you are told you can shower after the site heals, use a protective covering over the catheter to keep it dry.  Follow directions from your health care provider for bandage (dressing) changes.  Ask your health care provider what activities are safe for you. You may be restricted from lifting or making repetitive arm movements on the side with the catheter. Contact a health care provider if:  Swelling around the graft or fistula gets worse.  You develop new pain.  Your catheter gets damaged. Get help right away if:  You have pain, numbness, an unusual pale skin color, or blue fingers or sores at  the tips of your fingers in the hand on the side of your fistula.  You have chills.  You have a fever.  You have pus or other fluid (drainage) at the vascular access site.  You develop skin redness or red streaking on the skin around, above, or below the vascular access.  You have bleeding at the vascular access that cannot be easily controlled.  Your catheter gets pulled out of place.  You feel your heart racing or skipping beats.  You have chest pain. Summary  A vascular access is a connection to the blood inside your blood vessels that allows blood to be easily removed from your body and returned to your body during kidney dialysis (hemodialysis).  There are three types of vascular accesses.The type of access that is best for you depends on the size and strength of your  veins, your age, and any other health problems that you have, such as diabetes.  A fistula is usually the preferred type of access, although it is not an option for everyone. It can last several years and is less likely than the other types of accesses to become infected or to cause a blood clot within a blood vessel (thrombosis).  Wear a medical alert bracelet. In case of an emergency, this tells health care providers that you are a dialysis patient. This information is not intended to replace advice given to you by your health care provider. Make sure you discuss any questions you have with your health care provider. Document Revised: 12/08/2018 Document Reviewed: 09/13/2016 Elsevier Patient Education  [place].

## 2019-12-24 NOTE — Assessment & Plan Note (Signed)
a dialysis duplex was performed today.  The visualized portions of the left brachial artery to axillary vein AV graft were widely patent without focal stenosis, but no evaluation of the central venous circulation can be reliably given by duplex.  With signs of prolonged bleeding and arm swelling, I think a shuntogram with possible intervention would be a reasonable option.  Patient really does not want to have anything invasive done and asks if we can continue to monitor his arm saying that the swelling is getting better.  As long as he is getting adequate dialysis and his symptoms are tolerable, I am willing to continue conservative measures.  We agreed to return in about a month to discuss how his arm is doing and whether or not we should proceed with shuntogram at that point

## 2020-01-26 ENCOUNTER — Ambulatory Visit (INDEPENDENT_AMBULATORY_CARE_PROVIDER_SITE_OTHER): Payer: Medicare Other | Admitting: Nurse Practitioner

## 2020-07-09 ENCOUNTER — Other Ambulatory Visit: Payer: Self-pay

## 2020-07-09 ENCOUNTER — Ambulatory Visit (INDEPENDENT_AMBULATORY_CARE_PROVIDER_SITE_OTHER): Payer: Medicare Other

## 2020-07-09 ENCOUNTER — Encounter: Payer: Self-pay | Admitting: Gynecology

## 2020-07-09 ENCOUNTER — Ambulatory Visit
Admission: EM | Admit: 2020-07-09 | Discharge: 2020-07-09 | Disposition: A | Discharge status: Home / Self Care | Payer: Medicare Other | Attending: Family Medicine | Admitting: Family Medicine

## 2020-07-09 DIAGNOSIS — R0781 Pleurodynia: Secondary | ICD-10-CM

## 2020-07-09 MED ORDER — TRAMADOL HCL 50 MG PO TABS: 50.0000 mg | ORAL_TABLET | Freq: Two times a day (BID) | ORAL | 0 refills | Status: AC | PRN

## 2020-07-09 NOTE — Discharge Instructions (Addendum)
Medication as needed.  I'm not sure the cause of your pain.   Xray looked okay.  Take care  Dr. Lacinda Axon

## 2020-07-09 NOTE — ED Triage Notes (Signed)
Patient co bruise at upper left back and bilateral side. Patient stated having bilateral rib pain x 1 week. Per patient on dialysis and not sure how he got the bruises. Patient stated pain worse at night.

## 2020-07-09 NOTE — ED Provider Notes (Signed)
[facility]    CSN: [id number] Arrival date & time: 07/09/20  1318      History   Chief Complaint Chief Complaint  Patient presents with  . Bruise on back and side   HPI  59 year old male with intellectual disability and end-stage renal disease on hemodialysis presents with rib pain.  This appears to be an ongoing issue.  He has been seen in the ER on 10/11 reporting abdominal pain/rib pain.  Also seen in the ER on 10/20 with flank pain.  Work-up has been unremarkable.  Patient reports that he has had worsening pain for the past week.  Bilateral ribs.  He denies any fall, trauma, injury.  He does have a bruise to his left upper back around the shoulder blade and a small area of bruising to the left flank.  He is unsure why.  Pain 5/10 in severity.  Worse with certain activities and movements.  No relieving factors.  No other complaints.  Past Medical History:  Diagnosis Date  . Anxiety   . Chronic kidney disease   . Chronic low back pain   . Hypertension   . Sciatica   . Scoliosis     Patient Active Problem List   Diagnosis Date Noted  . Essential hypertension 12/24/2019  . ESRD on dialysis ([place]) 12/24/2019  . Anemia due to chronic kidney disease, on chronic dialysis ([place]) 06/07/2018  . Leukopenia 06/07/2018  . Thrombocytopenia ([place]) 06/07/2018  . 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 Elevated ferritin 07/30/2017  . Macrocytic anemia 07/30/2017  . Chronic kidney disease 07/17/2016  . Intellectual disability 07/16/2016  . Major depressive disorder, recurrent severe without psychotic features ([place]) 09/28/2015  . Hyperkalemia 09/20/2015    Past Surgical History:  Procedure Laterality Date  . CLEFT PALATE REPAIR    . EYE SURGERY     "when I was small"  . PERIPHERAL VASCULAR CATHETERIZATION N/A 09/29/2015   Procedure: Dialysis/Perma Catheter Insertion;  Surgeon: Katha Cabal, MD;  Location: [facility];  Service: Cardiovascular;  Laterality: N/A;  . ROTATOR CUFF REPAIR Left        Home Medications    Prior to Admission medications   Medication Sig Start Date End Date Taking? Authorizing Provider  acetaminophen (TYLENOL) 325 MG tablet Take 650 mg by mouth every 6 (six) hours as needed for mild pain or moderate pain.   Yes [provider]  albuterol (VENTOLIN HFA) 108 (90 Base) MCG/ACT inhaler INHALE 2 PUFFS INTO LUNGS EVERY 6 HOURS AS NEEDED 08/11/18  Yes [provider]  buPROPion (WELLBUTRIN XL) 150 MG 24 hr tablet Take 1 tablet (150 mg total) by mouth daily. 07/30/16  Yes Pucilowska, Jolanta B, MD  calcium acetate (PHOSLO) 667 MG capsule Take 2 capsules (1,334 mg total) by mouth 3 (three) times daily with meals. 07/29/16  Yes Pucilowska, Jolanta B, MD  fluticasone (FLONASE) 50 MCG/ACT nasal spray Place 1 spray into both nostrils daily.   Yes [provider]  gabapentin (NEURONTIN) 100 MG capsule Take 1 capsule by mouth 2 (two) times daily. 06/12/18  Yes [provider]  lidocaine-prilocaine (EMLA) cream APPLY 1 A SMALL AMOUNT TO SKIN THREE TIMES A WEEK APPLY TO SKIN OVER DIALYSIS ACCESS 30 60 MIN PRIOR TO TREATMENT COVER WITH SARAN WRAP 09/03/18  Yes [provider]  MELATONIN PO Take 1 tablet by mouth daily.   Yes [provider]  metoprolol succinate (TOPROL-XL) 50 MG  24 hr tablet Take 50 mg by mouth daily. 03/28/16 05/06/21 Yes [provider]  Multiple Vitamins-Minerals (MULTIVITAMIN ADULT PO) Take by mouth daily.    Yes [provider]  omeprazole (PRILOSEC) 40 MG capsule Take 40 mg by mouth daily.   Yes [provider]  polyethylene glycol powder (GLYCOLAX/MIRALAX) powder 1 cap full in a full glass of water, two times a day for 3 days. Patient taking differently: daily as needed. 1 cap full in a full glass of water, two times a day for 3 days. 03/26/18  Yes Carrie Mew, MD  QUEtiapine  (SEROQUEL) 200 MG tablet Take 1 tablet (200 mg total) by mouth at bedtime. 07/29/16  Yes Pucilowska, Jolanta B, MD  rOPINIRole (REQUIP) 1 MG tablet TAKE 1 TABLET BY MOUTH IN THE MORNING 09/03/18  Yes [provider]  rOPINIRole (REQUIP) 3 MG tablet TAKE 1 TABLET BY MOUTH AT BEDTIME AS DIRECTED 05/04/18  Yes [provider]  senna-docusate (SENOKOT-S) 8.6-50 MG tablet Take 2 tablets by mouth 2 (two) times daily. Patient taking differently: Take 1 tablet by mouth at bedtime.  10/07/15  Yes Gladstone Lighter, MD  aspirin EC 81 MG tablet Take 81 mg by mouth daily.    [provider]  citalopram (CELEXA) 10 MG tablet Take 10 mg by mouth daily. 06/16/20   [provider]  traMADol (ULTRAM) 50 MG tablet Take 1 tablet (50 mg total) by mouth every 12 (twelve) hours as needed. 07/09/20   Coral Spikes, DO    Family History Family History  Problem Relation Age of Onset  . Hypertension Other     Social History Social History   Tobacco Use  . Smoking status: Never Smoker  . Smokeless tobacco: Never Used  Substance Use Topics  . Alcohol use: No  . Drug use: No     Allergies   Vancomycin   Review of Systems Review of Systems Per HPI  Physical Exam Triage Vital Signs ED Triage Vitals  Enc Vitals Group     BP 07/09/20 1335 (!) 149/89     Pulse Rate 07/09/20 1335 68     Resp 07/09/20 1335 16     Temp 07/09/20 1335 97.7 F (36.5 C)     Temp Source 07/09/20 1335 Oral     SpO2 07/09/20 1335 99 %     Weight 07/09/20 1337 210 lb (95.3 kg)     Height 07/09/20 1337 6' (1.829 m)     Head Circumference --      Peak Flow --      Pain Score 07/09/20 1337 5     Pain Loc --      Pain Edu? --      Excl. in [place]? --    Updated Vital Signs BP (!) 149/89 (BP Location: Right Arm)   Pulse 68   Temp 97.7 F (36.5 C) (Oral)   Resp 16   Ht 6' (1.829 m)   Wt 95.3 kg   SpO2 99%   BMI 28.48 kg/m   Visual Acuity Right Eye Distance:   Left Eye Distance:     Bilateral Distance:    Right Eye Near:   Left Eye Near:    Bilateral Near:     Physical Exam Vitals and nursing note reviewed.  Constitutional:      General: He is not in acute distress.    Appearance: Normal appearance. He is not ill-appearing.  HENT:     Head: Normocephalic and atraumatic.  Eyes:     General:        Right eye: No discharge.        Left eye: No discharge.     Conjunctiva/sclera: Conjunctivae normal.  Cardiovascular:     Rate and Rhythm: Normal rate and regular rhythm.  Pulmonary:     Effort: Pulmonary effort is normal.     Breath sounds: Normal breath sounds.  Musculoskeletal:     Comments: No discrete areas of tenderness of the ribs bilaterally.  Skin:         Comments: Large bruise noted at the labelled location. Small area of bruising - left ribs.  Neurological:     Mental Status: He is alert.    UC Treatments / Results  Labs (all labs ordered are listed, but only abnormal results are displayed) Labs Reviewed - No data to display  EKG   Radiology DG Ribs Bilateral W/Chest  Result Date: 07/09/2020 CLINICAL DATA:  59 year old male with history of bruises in the upper left back and bilateral rib pain for 1 week. EXAM: BILATERAL RIBS AND CHEST - 4+ VIEW COMPARISON:  Chest x-ray 07/16/2016. FINDINGS: Right-sided PermCath with tips terminating in the distal superior vena cava and at the superior cavoatrial junction. Lung volumes are normal. No consolidative airspace disease. No pleural effusions. No pneumothorax. No pulmonary nodule or mass noted. Pulmonary vasculature and the cardiomediastinal silhouette are within normal limits. Atherosclerotic calcifications in the thoracic aorta. Dedicated views of the ribs demonstrate no acute displaced rib fractures. IMPRESSION: 1. No acute displaced rib fractures. 2. No radiographic evidence of acute cardiopulmonary disease. 3. Aortic atherosclerosis. Electronically Signed   By: Vinnie Langton M.D.   On:  07/09/2020 15:17    Procedures Procedures (including critical care time)  Medications Ordered in UC Medications - No data to display  Initial Impression / Assessment and Plan / UC Course  I have reviewed the triage vital signs and the nursing notes.  Pertinent labs & imaging results that were available during my care of the patient were reviewed by me and considered in my medical decision making (see chart for details).    59 year old male presents with rib pain.  Etiology unclear.  He does have some bruising.  Denies trauma.  X-ray obtained and was negative.  Tramadol as directed.  Renally dosed.  Final Clinical Impressions(s) / UC Diagnoses   Final diagnoses:  Rib pain     Discharge Instructions     Medication as needed.  I'm not sure the cause of your pain.   Xray looked okay.  Take care  Dr. Lacinda Axon   ED Prescriptions    Medication Sig Dispense Auth. Provider   traMADol (ULTRAM) 50 MG tablet Take 1 tablet (50 mg total) by mouth every 12 (twelve) hours as needed. 10 tablet Thersa Salt G, DO     I have reviewed the PDMP during this encounter.   Coral Spikes, [place] 07/09/20 1545

## 2022-01-26 ENCOUNTER — Encounter: Payer: Self-pay | Admitting: Emergency Medicine

## 2022-01-26 ENCOUNTER — Emergency Department: Payer: No Typology Code available for payment source

## 2022-01-26 ENCOUNTER — Emergency Department
Admission: EM | Admit: 2022-01-26 | Discharge: 2022-01-26 | Disposition: A | Discharge status: Home / Self Care | Payer: No Typology Code available for payment source | Attending: Emergency Medicine | Admitting: Emergency Medicine

## 2022-01-26 ENCOUNTER — Other Ambulatory Visit: Payer: Self-pay

## 2022-01-26 DIAGNOSIS — S0990XA Unspecified injury of head, initial encounter: Secondary | ICD-10-CM | POA: Insufficient documentation

## 2022-01-26 DIAGNOSIS — N186 End stage renal disease: Secondary | ICD-10-CM | POA: Diagnosis not present

## 2022-01-26 DIAGNOSIS — S80811A Abrasion, right lower leg, initial encounter: Secondary | ICD-10-CM | POA: Diagnosis not present

## 2022-01-26 DIAGNOSIS — I2510 Atherosclerotic heart disease of native coronary artery without angina pectoris: Secondary | ICD-10-CM | POA: Insufficient documentation

## 2022-01-26 DIAGNOSIS — I700 Atherosclerosis of aorta: Secondary | ICD-10-CM | POA: Diagnosis not present

## 2022-01-26 DIAGNOSIS — Z79899 Other long term (current) drug therapy: Secondary | ICD-10-CM | POA: Diagnosis not present

## 2022-01-26 DIAGNOSIS — S80812A Abrasion, left lower leg, initial encounter: Secondary | ICD-10-CM | POA: Diagnosis not present

## 2022-01-26 DIAGNOSIS — Y92410 Unspecified street and highway as the place of occurrence of the external cause: Secondary | ICD-10-CM | POA: Insufficient documentation

## 2022-01-26 DIAGNOSIS — I120 Hypertensive chronic kidney disease with stage 5 chronic kidney disease or end stage renal disease: Secondary | ICD-10-CM | POA: Diagnosis not present

## 2022-01-26 DIAGNOSIS — S3981XA Other specified injuries of abdomen, initial encounter: Secondary | ICD-10-CM | POA: Diagnosis not present

## 2022-01-26 DIAGNOSIS — S161XXA Strain of muscle, fascia and tendon at neck level, initial encounter: Secondary | ICD-10-CM | POA: Insufficient documentation

## 2022-01-26 DIAGNOSIS — Z992 Dependence on renal dialysis: Secondary | ICD-10-CM | POA: Diagnosis not present

## 2022-01-26 DIAGNOSIS — S199XXA Unspecified injury of neck, initial encounter: Secondary | ICD-10-CM | POA: Diagnosis present

## 2022-01-26 DIAGNOSIS — Z7982 Long term (current) use of aspirin: Secondary | ICD-10-CM | POA: Diagnosis not present

## 2022-01-26 DIAGNOSIS — T148XXA Other injury of unspecified body region, initial encounter: Secondary | ICD-10-CM

## 2022-01-26 LAB — CBC
HCT: 33.7 % — ABNORMAL LOW (ref 39.0–52.0)
Hemoglobin: 10.9 g/dL — ABNORMAL LOW (ref 13.0–17.0)
MCH: 30.9 pg (ref 26.0–34.0)
MCHC: 32.3 g/dL (ref 30.0–36.0)
MCV: 95.5 fL (ref 80.0–100.0)
Platelets: 111 10*3/uL — ABNORMAL LOW (ref 150–400)
RBC: 3.53 MIL/uL — ABNORMAL LOW (ref 4.22–5.81)
RDW: 14 % (ref 11.5–15.5)
WBC: 4.1 10*3/uL (ref 4.0–10.5)
nRBC: 0 % (ref 0.0–0.2)

## 2022-01-26 LAB — COMPREHENSIVE METABOLIC PANEL
ALT: 13 U/L (ref 0–44)
AST: 19 U/L (ref 15–41)
Albumin: 4.1 g/dL (ref 3.5–5.0)
Alkaline Phosphatase: 121 U/L (ref 38–126)
Anion gap: 10 (ref 5–15)
BUN: 35 mg/dL — ABNORMAL HIGH (ref 6–20)
CO2: 27 mmol/L (ref 22–32)
Calcium: 8.9 mg/dL (ref 8.9–10.3)
Chloride: 104 mmol/L (ref 98–111)
Creatinine, Ser: 5.45 mg/dL — ABNORMAL HIGH (ref 0.61–1.24)
GFR, Estimated: 11 mL/min — ABNORMAL LOW (ref >60)
Glucose, Bld: 122 mg/dL — ABNORMAL HIGH (ref 70–99)
Potassium: 4.1 mmol/L (ref 3.5–5.1)
Sodium: 141 mmol/L (ref 135–145)
Total Bilirubin: 0.9 mg/dL (ref 0.3–1.2)
Total Protein: 7 g/dL (ref 6.5–8.1)

## 2022-01-26 LAB — ETHANOL: Alcohol, Ethyl (B): <10 mg/dL (ref <10)

## 2022-01-26 MED ORDER — SODIUM CHLORIDE 0.9 % IV BOLUS
500.0000 mL | Freq: Once | INTRAVENOUS | Status: AC
  Administered 2022-01-26: 500 mL via INTRAVENOUS

## 2022-01-26 MED ORDER — IOHEXOL 300 MG/ML  SOLN
100.0000 mL | Freq: Once | INTRAMUSCULAR | Status: AC | PRN
  Administered 2022-01-26: 100 mL via INTRAVENOUS
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
## 2022-01-26 NOTE — Discharge Instructions (Signed)
You may take Tylenol as needed for pain.  Return to the ER for worsening symptoms, persistent vomiting, difficulty breathing, lethargy or other concerns.

## 2022-01-31 ENCOUNTER — Encounter: Payer: Self-pay | Admitting: Hematology and Oncology

## 2022-02-01 ENCOUNTER — Emergency Department: Payer: No Typology Code available for payment source

## 2022-02-01 ENCOUNTER — Emergency Department
Admission: EM | Admit: 2022-02-01 | Discharge: 2022-02-01 | Disposition: A | Discharge status: Home / Self Care | Payer: No Typology Code available for payment source | Attending: Emergency Medicine | Admitting: Emergency Medicine

## 2022-02-01 ENCOUNTER — Encounter: Payer: Self-pay | Admitting: Medical Oncology

## 2022-02-01 DIAGNOSIS — S40012A Contusion of left shoulder, initial encounter: Secondary | ICD-10-CM | POA: Insufficient documentation

## 2022-02-01 DIAGNOSIS — S4992XA Unspecified injury of left shoulder and upper arm, initial encounter: Secondary | ICD-10-CM | POA: Diagnosis present

## 2022-02-01 DIAGNOSIS — Z992 Dependence on renal dialysis: Secondary | ICD-10-CM | POA: Insufficient documentation

## 2022-02-01 DIAGNOSIS — Y92410 Unspecified street and highway as the place of occurrence of the external cause: Secondary | ICD-10-CM | POA: Insufficient documentation

## 2022-02-01 DIAGNOSIS — M25512 Pain in left shoulder: Secondary | ICD-10-CM

## 2022-02-01 LAB — BASIC METABOLIC PANEL
Anion gap: 8 (ref 5–15)
BUN: 48 mg/dL — ABNORMAL HIGH (ref 6–20)
CO2: 28 mmol/L (ref 22–32)
Calcium: 8.8 mg/dL — ABNORMAL LOW (ref 8.9–10.3)
Chloride: 103 mmol/L (ref 98–111)
Creatinine, Ser: 6.41 mg/dL — ABNORMAL HIGH (ref 0.61–1.24)
GFR, Estimated: 9 mL/min — ABNORMAL LOW (ref >60)
Glucose, Bld: 84 mg/dL (ref 70–99)
Potassium: 4.7 mmol/L (ref 3.5–5.1)
Sodium: 139 mmol/L (ref 135–145)

## 2022-02-01 MED ORDER — CHLORHEXIDINE GLUCONATE CLOTH 2 % EX PADS: 6.0000 | MEDICATED_PAD | Freq: Every day | CUTANEOUS | Status: DC

## 2022-02-01 NOTE — ED Notes (Signed)
Dialysis team at bedside. Spoke with pt about completing treatment today while here.

## 2022-02-01 NOTE — TOC Initial Note (Signed)
Transition of Care [facility]) - Initial/Assessment Note    Patient Details  Name: Ronnie Keith MRN: [id number] Date of Birth: [birth year]  [facility] CM/SW Contact:    Shelbie Hutching, RN Phone Number: 02/01/2022, 4:21 PM  Clinical Narrative:                 Patient came into the hospital for shoulder pain.  He was at dialysis today but was not feeling well so he reports coming to the hospital, he did not get dialysis. Spoke with the dialysis coordinator Estill Bamberg, she was talking to patient and he mentioned that he might not want to do dialysis any longer.  RNCM met with patient at the bedside along with his mother.  Patient is still agreeing to go he just seems depressed and hopeless.  Informed him that I would be making an outpatient palliative referral so they can follow with him at home.  They can come and speak with patient and discuss such a big decision.  Also suggested that the patient make an appointment with his psychiatrist and discuss the feelings he has been having lately.    He and his mother are not happy that his dialysis schedule has been changed from MWF to [facility].  Suggested that they talk with the center and discuss why this change has been made.    Referral for OP Palliative given to [facility] with [facility]. Patient being discharged back home with his mother.    Expected Discharge Plan: [place] Barriers to Discharge: Barriers Resolved   Patient Goals and CMS Choice        Expected Discharge Plan and Services Expected Discharge Plan: [place]   Discharge Planning Services: CM Consult   Living arrangements for the past 2 months: Single Family Home                           HH Arranged: NA [place] Agency: NA        Prior Living Arrangements/Services Living arrangements for the past 2 months: Single Family Home Lives with:: Parents Patient language and need for interpreter reviewed:: Yes Do you feel safe going  back to the place where you live?: Yes      Need for Family Participation in Patient Care: Yes (Comment) Care giver support system in place?: Yes (comment)      Activities of Daily Living      Permission Sought/Granted Permission sought to share information with : Case Manager, Family Supports Permission granted to share information with : Yes, Verbal Permission Granted  Share Information with NAME: Pearlie Lafosse  Permission granted to share info w AGENCY: [facility]  Permission granted to share info w Relationship: mother  Permission granted to share info w Contact Information: [phone]  Emotional Assessment Appearance:: Appears older than stated age Attitude/Demeanor/Rapport: Avoidant Affect (typically observed): Hopeless, Withdrawn Orientation: : Oriented to Self, Oriented to Place, Oriented to Situation, Oriented to  Time Alcohol / Substance Use: Not Applicable Psych Involvement: Outpatient Provider  Admission diagnosis:  shoulder pain Patient Active Problem List   Diagnosis Date Noted   Essential hypertension 12/24/2019   ESRD on dialysis ([place]) 12/24/2019   Anemia due to chronic kidney disease, on chronic dialysis ([place]) 06/07/2018   Leukopenia 06/07/2018   Thrombocytopenia ([place]) 06/07/2018   

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
## 2022-02-01 NOTE — ED Notes (Signed)
RN assisted pt to use bedside urinal.
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
## 2022-02-01 NOTE — ED Notes (Signed)
RN to bedside to assist family. Pt is crying and upset and is asking "I just want things done my way". RN has requested for case management  to see the family. Family advised they do not feel safe taking pt home in this manner.

## 2022-02-01 NOTE — Discharge Instructions (Addendum)
Follow-up with your primary care provider if any continued problems.  Also resume your regular dialysis schedule beginning on Tuesday as your labs were reviewed by the dialysis center at the hospital.  Continue with your current medication but also contact your psychiatrist to see if medications need to be adjusted to help with your feeling and also discuss discontinuation of your dialysis.

## 2022-02-01 NOTE — ED Notes (Signed)
This RN to bedside to speak with mother and patient. Pt is upset and asking to stop dialysis because he is upset they changed his dialysis schedule. Mother asked if she could speak to someone about having his scheduled changed back so he will agree to go.

## 2022-02-01 NOTE — ED Notes (Signed)
RN to bedside. Pt is not answering questions appropriately. CAO to place but not self. MD made aware to come evaluate.

## 2022-02-18 ENCOUNTER — Telehealth: Payer: Self-pay | Admitting: Primary Care

## 2022-02-18 NOTE — Telephone Encounter (Signed)
Spoke with patient and discussed the Palliative referral/services with hm and all questions were answered and he was in agreement with beginning services with [place].  I have scheduled an In-home Consult for 02/26/22 @ 12:30 PM.  Patient wanted to wait until the NP came back from vacation to schedule.

## 2022-02-26 ENCOUNTER — Other Ambulatory Visit: Payer: Medicare Other | Admitting: Primary Care

## 2022-02-26 ENCOUNTER — Encounter: Payer: Self-pay | Admitting: Hematology and Oncology

## 2022-02-28 ENCOUNTER — Telehealth: Payer: Self-pay | Admitting: Primary Care

## 2022-02-28 NOTE — Telephone Encounter (Signed)
Spoke with patient and have rescheduled the In-home Palliative Consult to 03/12/22 @ 2:45 PM (patient was a No Show for previous appointment)

## 2022-03-12 ENCOUNTER — Other Ambulatory Visit: Payer: Medicare Other | Admitting: Primary Care

## 2022-03-12 DIAGNOSIS — Z515 Encounter for palliative care: Secondary | ICD-10-CM

## 2022-03-12 DIAGNOSIS — D696 Thrombocytopenia, unspecified: Secondary | ICD-10-CM

## 2022-03-12 DIAGNOSIS — Z992 Dependence on renal dialysis: Secondary | ICD-10-CM

## 2022-03-12 DIAGNOSIS — D539 Nutritional anemia, unspecified: Secondary | ICD-10-CM
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
## 2022-03-14 ENCOUNTER — Ambulatory Visit: Payer: Medicare Other

## 2022-03-14 DIAGNOSIS — Z789 Other specified health status: Secondary | ICD-10-CM
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
## 2022-04-02 ENCOUNTER — Other Ambulatory Visit: Payer: Medicare Other | Admitting: Primary Care

## 2022-04-03 NOTE — Progress Notes (Signed)
Home visit made per appointment, pt not at home. Rescheduled.

## 2022-04-04 ENCOUNTER — Other Ambulatory Visit: Payer: Medicare Other | Admitting: Primary Care

## 2022-04-04 DIAGNOSIS — Z992 Dependence on renal dialysis: Secondary | ICD-10-CM

## 2022-04-04 DIAGNOSIS — Z515 Encounter for palliative care: Secondary | ICD-10-CM

## 2022-04-04 DIAGNOSIS — F79 Unspecified intellectual disabilities: Secondary | ICD-10-CM

## 2022-04-04 DIAGNOSIS — N186 End stage renal disease: Secondary | ICD-10-CM

## 2022-04-04 DIAGNOSIS — I10 Essential (primary) hypertension: Secondary | ICD-10-CM

## 2022-04-04 DIAGNOSIS — F332 Major depressive disorder, recurrent severe without psychotic features: Secondary | ICD-10-CM
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
## 2022-05-16 ENCOUNTER — Other Ambulatory Visit: Payer: Medicare Other | Admitting: Primary Care

## 2022-05-24 NOTE — Progress Notes (Signed)
No show at home visit, will reschedule

## 2022-07-09 ENCOUNTER — Other Ambulatory Visit: Payer: Medicare Other | Admitting: Primary Care

## 2022-07-09 DIAGNOSIS — Z515 Encounter for palliative care: Secondary | ICD-10-CM

## 2022-07-09 NOTE — Progress Notes (Signed)
    [facility] Consult Note Telephone: [phone]  Fax: [phone]    Date of encounter: 07/09/22 12:45 PM PATIENT NAME: Ronnie Keith [address] [place] [address]   [phone] (home)  DOB: 1960-09-05 MRN: [id number] PRIMARY CARE PROVIDER:    Verdie Shire, MD,  [address] [address] [phone]  REFERRING PROVIDER:   Verdie Shire, MD [address],  [place] [address] [phone]  RESPONSIBLE PARTY:    Contact Information     Name Relation Home Work Mobile   [place],Sally Mother [phone]        I spent 0 minutes providing this consultation.  Third no show, despite extensive discussion yesterday about time and date of visit. D/c from community [facility] care.  Thank you for the opportunity to participate in the care of Ronnie Keith. D/c from pal care. Please call our office at [phone] if we can be of additional assistance.   Jason Coop, NP   C
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
## 2024-04-02 ENCOUNTER — Encounter: Payer: Self-pay | Admitting: Hematology and Oncology

## 2024-04-02 NOTE — Progress Notes (Signed)
 No show

## 2024-04-05 ENCOUNTER — Ambulatory Visit: Payer: Self-pay | Admitting: Internal Medicine
